# Patient Record
Sex: Female | Born: 1950
Health system: Southern US, Community
[De-identification: ages and names within clinical notes are randomized; demographics above are authoritative.]

## PROBLEM LIST (undated history)

## (undated) DIAGNOSIS — J189 Pneumonia, unspecified organism: Secondary | ICD-10-CM

## (undated) DIAGNOSIS — C9000 Multiple myeloma not having achieved remission: Secondary | ICD-10-CM

## (undated) DIAGNOSIS — M1990 Unspecified osteoarthritis, unspecified site: Secondary | ICD-10-CM

## (undated) DIAGNOSIS — Z87442 Personal history of urinary calculi: Secondary | ICD-10-CM

## (undated) DIAGNOSIS — T8859XA Other complications of anesthesia, initial encounter: Secondary | ICD-10-CM

## (undated) DIAGNOSIS — Z8619 Personal history of other infectious and parasitic diseases: Secondary | ICD-10-CM

## (undated) DIAGNOSIS — G709 Myoneural disorder, unspecified: Secondary | ICD-10-CM

## (undated) DIAGNOSIS — J302 Other seasonal allergic rhinitis: Secondary | ICD-10-CM

## (undated) DIAGNOSIS — N189 Chronic kidney disease, unspecified: Secondary | ICD-10-CM

## (undated) HISTORY — DX: Personal history of other infectious and parasitic diseases: Z86.19

## (undated) HISTORY — DX: Chronic kidney disease, unspecified: N18.9

## (undated) HISTORY — PX: NO PAST SURGERIES: SHX2092

## (undated) HISTORY — DX: Multiple myeloma not having achieved remission: C90.00

## (undated) HISTORY — PX: APPENDECTOMY: SHX54

---

## 2003-04-22 ENCOUNTER — Other Ambulatory Visit: Admission: RE | Admit: 2003-04-22 | Discharge: 2003-04-22 | Payer: Self-pay | Admitting: Gynecology

## 2006-08-07 ENCOUNTER — Ambulatory Visit: Payer: Self-pay | Admitting: Internal Medicine

## 2009-02-03 ENCOUNTER — Ambulatory Visit: Payer: Self-pay | Admitting: Urology

## 2010-01-16 ENCOUNTER — Ambulatory Visit: Payer: Self-pay | Admitting: Internal Medicine

## 2010-02-01 ENCOUNTER — Ambulatory Visit: Payer: Self-pay | Admitting: Urology

## 2010-02-05 ENCOUNTER — Ambulatory Visit: Payer: Self-pay | Admitting: Nephrology

## 2010-02-06 ENCOUNTER — Inpatient Hospital Stay: Payer: Self-pay | Admitting: Nephrology

## 2010-02-15 ENCOUNTER — Ambulatory Visit: Payer: Self-pay | Admitting: Internal Medicine

## 2010-07-06 ENCOUNTER — Ambulatory Visit: Payer: Self-pay | Admitting: Cardiovascular Disease

## 2010-07-10 ENCOUNTER — Ambulatory Visit: Payer: Self-pay | Admitting: Cardiovascular Disease

## 2010-07-12 ENCOUNTER — Ambulatory Visit: Payer: Self-pay | Admitting: Cardiovascular Disease

## 2010-11-17 ENCOUNTER — Ambulatory Visit: Payer: Self-pay | Admitting: Internal Medicine

## 2010-12-18 ENCOUNTER — Ambulatory Visit: Payer: Self-pay | Admitting: Internal Medicine

## 2011-01-17 ENCOUNTER — Ambulatory Visit: Payer: Self-pay | Admitting: Internal Medicine

## 2012-02-27 ENCOUNTER — Ambulatory Visit: Payer: Self-pay | Admitting: Hematology and Oncology

## 2012-03-18 ENCOUNTER — Ambulatory Visit: Payer: Self-pay | Admitting: Hematology and Oncology

## 2012-04-08 ENCOUNTER — Emergency Department: Payer: Self-pay | Admitting: Emergency Medicine

## 2012-04-08 LAB — COMPREHENSIVE METABOLIC PANEL
Albumin: 3.8 g/dL (ref 3.4–5.0)
BUN: 26 mg/dL — ABNORMAL HIGH (ref 7–18)
Bilirubin,Total: 0.5 mg/dL (ref 0.2–1.0)
Chloride: 105 mmol/L (ref 98–107)
Co2: 24 mmol/L (ref 21–32)
EGFR (Non-African Amer.): >60
Glucose: 146 mg/dL — ABNORMAL HIGH (ref 65–99)
SGOT(AST): 19 U/L (ref 15–37)
SGPT (ALT): 19 U/L (ref 12–78)
Total Protein: 7.5 g/dL (ref 6.4–8.2)

## 2012-04-08 LAB — CBC
HGB: 12.8 g/dL (ref 12.0–16.0)
MCH: 30.3 pg (ref 26.0–34.0)
MCHC: 33.6 g/dL (ref 32.0–36.0)
Platelet: 176 10*3/uL (ref 150–440)
RBC: 4.24 10*6/uL (ref 3.80–5.20)
RDW: 14 % (ref 11.5–14.5)

## 2012-05-16 ENCOUNTER — Ambulatory Visit: Payer: Self-pay | Admitting: Hematology and Oncology

## 2012-06-05 ENCOUNTER — Ambulatory Visit: Payer: Self-pay | Admitting: General Practice

## 2012-06-05 LAB — CBC CANCER CENTER
Eosinophil #: 0.1 x10 3/mm (ref 0.0–0.7)
HCT: 37.5 % (ref 35.0–47.0)
HGB: 12.5 g/dL (ref 12.0–16.0)
Monocyte %: 9.8 %
Neutrophil #: 2.5 x10 3/mm (ref 1.4–6.5)
Neutrophil %: 53.2 %
Platelet: 139 x10 3/mm — ABNORMAL LOW (ref 150–440)
RBC: 4.15 10*6/uL (ref 3.80–5.20)
RDW: 13.4 % (ref 11.5–14.5)
WBC: 4.7 x10 3/mm (ref 3.6–11.0)

## 2012-06-05 LAB — URINALYSIS, COMPLETE
Glucose,UR: NEGATIVE mg/dL (ref 0–75)
Ketone: NEGATIVE
Nitrite: NEGATIVE
Protein: NEGATIVE
RBC,UR: 2 /HPF (ref 0–5)
Specific Gravity: 1.023 (ref 1.003–1.030)
WBC UR: 1 /HPF (ref 0–5)

## 2012-06-05 LAB — COMPREHENSIVE METABOLIC PANEL
Anion Gap: 8 (ref 7–16)
BUN: 21 mg/dL — ABNORMAL HIGH (ref 7–18)
Calcium, Total: 8.3 mg/dL — ABNORMAL LOW (ref 8.5–10.1)
Co2: 29 mmol/L (ref 21–32)
EGFR (Non-African Amer.): 46 — ABNORMAL LOW
Osmolality: 279 (ref 275–301)

## 2012-06-08 LAB — KAPPA/LAMBDA FREE LIGHT CHAINS (ARMC)

## 2012-06-08 LAB — PROT IMMUNOELECTROPHORES(ARMC)

## 2012-06-16 ENCOUNTER — Ambulatory Visit: Payer: Self-pay | Admitting: Hematology and Oncology

## 2012-06-25 LAB — CBC CANCER CENTER
Basophil #: 0 x10 3/mm (ref 0.0–0.1)
Basophil %: 1 %
Eosinophil #: 0.1 x10 3/mm (ref 0.0–0.7)
Eosinophil %: 1.1 %
HCT: 39.5 % (ref 35.0–47.0)
MCH: 31 pg (ref 26.0–34.0)
MCHC: 34.1 g/dL (ref 32.0–36.0)
Monocyte #: 0.7 x10 3/mm (ref 0.2–0.9)
Monocyte %: 13.6 %
Neutrophil #: 2.8 x10 3/mm (ref 1.4–6.5)
Platelet: 165 x10 3/mm (ref 150–440)
RBC: 4.35 10*6/uL (ref 3.80–5.20)
WBC: 5 x10 3/mm (ref 3.6–11.0)

## 2012-06-25 LAB — BASIC METABOLIC PANEL
Anion Gap: 6 — ABNORMAL LOW (ref 7–16)
BUN: 18 mg/dL (ref 7–18)
Chloride: 107 mmol/L (ref 98–107)
Co2: 28 mmol/L (ref 21–32)
Creatinine: 0.84 mg/dL (ref 0.60–1.30)
EGFR (African American): >60
EGFR (Non-African Amer.): >60
Glucose: 107 mg/dL — ABNORMAL HIGH (ref 65–99)
Osmolality: 284 (ref 275–301)
Sodium: 141 mmol/L (ref 136–145)

## 2012-07-16 ENCOUNTER — Ambulatory Visit: Payer: Self-pay | Admitting: Hematology and Oncology

## 2012-08-17 ENCOUNTER — Ambulatory Visit: Payer: Self-pay | Admitting: Internal Medicine

## 2012-11-05 ENCOUNTER — Ambulatory Visit: Payer: Self-pay | Admitting: Internal Medicine

## 2012-11-26 ENCOUNTER — Ambulatory Visit: Payer: Self-pay | Admitting: Hematology and Oncology

## 2012-11-26 LAB — CBC CANCER CENTER
Basophil %: 0.8 %
Eosinophil %: 0.7 %
Lymphocyte #: 1.6 x10 3/mm (ref 1.0–3.6)
Lymphocyte %: 31.5 %
MCHC: 33.5 g/dL (ref 32.0–36.0)
Monocyte #: 0.6 x10 3/mm (ref 0.2–0.9)
Neutrophil %: 55.7 %
Platelet: 166 x10 3/mm (ref 150–440)
RBC: 4.29 10*6/uL (ref 3.80–5.20)
RDW: 13.3 % (ref 11.5–14.5)
WBC: 5 x10 3/mm (ref 3.6–11.0)

## 2012-11-26 LAB — BASIC METABOLIC PANEL
Anion Gap: 9 (ref 7–16)
BUN: 21 mg/dL — ABNORMAL HIGH (ref 7–18)
Calcium, Total: 9 mg/dL (ref 8.5–10.1)
Co2: 29 mmol/L (ref 21–32)
EGFR (African American): >60
EGFR (Non-African Amer.): 54 — ABNORMAL LOW
Glucose: 121 mg/dL — ABNORMAL HIGH (ref 65–99)
Osmolality: 285 (ref 275–301)

## 2012-11-30 LAB — PROT IMMUNOELECTROPHORES(ARMC)

## 2012-11-30 LAB — KAPPA/LAMBDA FREE LIGHT CHAINS (ARMC)

## 2012-12-15 ENCOUNTER — Ambulatory Visit: Payer: Self-pay | Admitting: Internal Medicine

## 2012-12-16 ENCOUNTER — Ambulatory Visit: Payer: Self-pay | Admitting: Hematology and Oncology

## 2013-01-04 ENCOUNTER — Encounter: Payer: Self-pay | Admitting: Internal Medicine

## 2013-01-04 ENCOUNTER — Encounter (INDEPENDENT_AMBULATORY_CARE_PROVIDER_SITE_OTHER): Payer: Self-pay

## 2013-01-04 ENCOUNTER — Ambulatory Visit (INDEPENDENT_AMBULATORY_CARE_PROVIDER_SITE_OTHER): Payer: 59 | Admitting: Internal Medicine

## 2013-01-04 VITALS — BP 130/70 | HR 63 | Temp 98.2°F | Ht 68.25 in | Wt 174.8 lb

## 2013-01-04 DIAGNOSIS — C9000 Multiple myeloma not having achieved remission: Secondary | ICD-10-CM | POA: Insufficient documentation

## 2013-01-04 NOTE — Progress Notes (Signed)
  Subjective:    Patient ID: April Chapman, female    DOB: 22-Dec-1950, 62 y.o.   MRN: [id number]  HPI 62 year old female with past history of multiple myeloma who comes in today to establish care.  She is followed at the [facility] for her multitple myeloma.  Was diagnosed in 2011.  Is s/p stem cell transplant 08/2010.  Is in full remission.  Doing well.   Previously affected her kidneys.  Saw Dr Cherylann Ratel.  Now kidney function is normal per her report.  Is evaluated at the [facility] every six months.  Has not had a primary care doctor.  Has not had a breast, pelvic or pap smear in years.  No recent mammogram.  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  Breathing stable.  No acid reflux.  Bowels stable.     Past Medical History  Diagnosis Date  . Multiple myeloma     s/p stem cell transplant (08/2010)  . History of chicken pox   . Chronic kidney disease H/O    stones    Review of Systems Patient denies any headache, lightheadedness or dizziness.  No sinus or allergy symptoms.   No chest pain, tightness or palpitations.  No increased shortness of breath, cough or congestion.  No nausea or vomiting.  No acid reflux.  No abdominal pain or cramping.  No bowel change, such as diarrhea, constipation, BRBPR or melana.  No urine change.  Overall she feels well.  Feels she is handling stress well.         Objective:   Physical Exam Filed Vitals:   01/04/13 0809  BP: 130/70  Pulse: 63  Temp: 98.2 F (68.89 C)   62 year old female in no acute distress.   HEENT:  Nares- clear.  Oropharynx - without lesions. NECK:  Supple.  Nontender.  No audible bruit.  HEART:  Appears to be regular. LUNGS:  No crackles or wheezing audible.  Respirations even and unlabored.  RADIAL PULSE:  Equal bilaterally.     ABDOMEN:  Soft, nontender.  Bowel sounds present and normal.  No audible abdominal bruit.   EXTREMITIES:  No increased edema present.  DP pulses  palpable and equal bilaterally.          Assessment & Plan:  PREVIOUS RENAL INSUFFICIENCY.  Saw Dr Cherylann Ratel.  Now renal function normal per her report.  Obtain records.    HEALTH MAINTENANCE.  Schedule her for a physical next visit.  Needs mammogram and colonoscopy.  Discussed with her today.  She declines.  Obtain outside records.    I spent 35 minutes with the patient and more than 50% of the time was spent in consultation regarding the above.

## 2013-01-04 NOTE — Assessment & Plan Note (Signed)
Diagnosed in 2011.  S/p stem cell transplant in 08/2010.  Doing well.  Followed at the [facility].  They are checking labs.  Obtain results.

## 2013-02-25 ENCOUNTER — Ambulatory Visit: Payer: Self-pay | Admitting: Hematology and Oncology

## 2013-02-25 LAB — CBC CANCER CENTER
Basophil %: 0.9 %
Eosinophil #: 0.1 x10 3/mm (ref 0.0–0.7)
Eosinophil %: 2.1 %
HGB: 13.4 g/dL (ref 12.0–16.0)
Lymphocyte #: 1.4 x10 3/mm (ref 1.0–3.6)
Lymphocyte %: 22 %
MCH: 30 pg (ref 26.0–34.0)
Monocyte #: 0.9 x10 3/mm (ref 0.2–0.9)
Neutrophil %: 60.5 %
Platelet: 150 x10 3/mm (ref 150–440)
RBC: 4.45 10*6/uL (ref 3.80–5.20)
RDW: 13.2 % (ref 11.5–14.5)
WBC: 6.5 x10 3/mm (ref 3.6–11.0)

## 2013-02-25 LAB — COMPREHENSIVE METABOLIC PANEL
Bilirubin,Total: 0.5 mg/dL (ref 0.2–1.0)
Chloride: 101 mmol/L (ref 98–107)
Co2: 28 mmol/L (ref 21–32)
Creatinine: 0.99 mg/dL (ref 0.60–1.30)
EGFR (African American): >60
EGFR (Non-African Amer.): >60
Glucose: 99 mg/dL (ref 65–99)
Osmolality: 280 (ref 275–301)
Potassium: 3.5 mmol/L (ref 3.5–5.1)
SGPT (ALT): 20 U/L (ref 12–78)

## 2013-03-01 LAB — KAPPA/LAMBDA FREE LIGHT CHAINS (ARMC)

## 2013-03-18 ENCOUNTER — Ambulatory Visit: Payer: Self-pay | Admitting: Hematology and Oncology

## 2013-04-05 ENCOUNTER — Encounter: Payer: 59 | Admitting: Internal Medicine

## 2013-04-23 ENCOUNTER — Other Ambulatory Visit (HOSPITAL_COMMUNITY)
Admission: RE | Admit: 2013-04-23 | Discharge: 2013-04-23 | Disposition: A | Discharge status: Home / Self Care | Payer: 59 | Source: Ambulatory Visit | Attending: Internal Medicine | Admitting: Internal Medicine

## 2013-04-23 ENCOUNTER — Encounter: Payer: Self-pay | Admitting: Internal Medicine

## 2013-04-23 ENCOUNTER — Encounter: Payer: Self-pay | Admitting: Emergency Medicine

## 2013-04-23 ENCOUNTER — Ambulatory Visit (INDEPENDENT_AMBULATORY_CARE_PROVIDER_SITE_OTHER): Payer: 59 | Admitting: Internal Medicine

## 2013-04-23 VITALS — BP 120/80 | HR 64 | Temp 97.9°F | Ht 68.0 in | Wt 173.5 lb

## 2013-04-23 DIAGNOSIS — M25519 Pain in unspecified shoulder: Secondary | ICD-10-CM

## 2013-04-23 DIAGNOSIS — Z124 Encounter for screening for malignant neoplasm of cervix: Secondary | ICD-10-CM

## 2013-04-23 DIAGNOSIS — IMO0002 Reserved for concepts with insufficient information to code with codable children: Secondary | ICD-10-CM

## 2013-04-23 DIAGNOSIS — Z01419 Encounter for gynecological examination (general) (routine) without abnormal findings: Secondary | ICD-10-CM | POA: Insufficient documentation

## 2013-04-23 DIAGNOSIS — M25512 Pain in left shoulder: Secondary | ICD-10-CM

## 2013-04-23 DIAGNOSIS — Z1211 Encounter for screening for malignant neoplasm of colon: Secondary | ICD-10-CM

## 2013-04-23 DIAGNOSIS — W5503XA Scratched by cat, initial encounter: Secondary | ICD-10-CM

## 2013-04-23 DIAGNOSIS — M25521 Pain in right elbow: Secondary | ICD-10-CM

## 2013-04-23 DIAGNOSIS — C9000 Multiple myeloma not having achieved remission: Secondary | ICD-10-CM

## 2013-04-23 DIAGNOSIS — M25529 Pain in unspecified elbow: Secondary | ICD-10-CM

## 2013-04-23 DIAGNOSIS — M79609 Pain in unspecified limb: Secondary | ICD-10-CM

## 2013-04-23 DIAGNOSIS — M79673 Pain in unspecified foot: Secondary | ICD-10-CM

## 2013-04-23 DIAGNOSIS — L989 Disorder of the skin and subcutaneous tissue, unspecified: Secondary | ICD-10-CM

## 2013-04-23 DIAGNOSIS — W64XXXA Exposure to other animate mechanical forces, initial encounter: Secondary | ICD-10-CM

## 2013-04-23 DIAGNOSIS — Z1151 Encounter for screening for human papillomavirus (HPV): Secondary | ICD-10-CM | POA: Insufficient documentation

## 2013-04-23 MED ORDER — AMOXICILLIN-POT CLAVULANATE 500-125 MG PO TABS: 1.0000 | ORAL_TABLET | Freq: Three times a day (TID) | ORAL | Status: DC

## 2013-04-23 NOTE — Progress Notes (Signed)
Pre-visit discussion using our clinic review tool. No additional management support is needed unless otherwise documented below in the visit note.  

## 2013-04-23 NOTE — Progress Notes (Signed)
Subjective:    Patient ID: April Chapman, female    DOB: Aug 04, 1950, 63 y.o.   MRN: [id number]  HPI 63 year old female with past history of multiple myeloma who comes in today for her physical exam.  She is followed at the [facility] for her multitple myeloma.  Was diagnosed in 2011.  Is s/p stem cell transplant 08/2010.  Is in full remission.  Doing well.   Previously affected her kidneys.  Saw Dr Holley Raring.  Now kidney function is normal per her report.  Is evaluated at the [facility] every six months.  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  Breathing stable.  No acid reflux.  Bowels stable.  She reports having left shoulder discomfort.  Present for four months.  Saw Dr Sabra Heck.  Had xray.  Diagnosed with bursitis.  She also reports right elbow discomfort.  She previously fell off a truck - one month ago.  Is getting better.  Also reports right foot/heel pain.  Since changing shoes, discomfort better.  If she walks more, better.  She also reports a cat scratch on her left fifth finger.  Has kept clean.     Past Medical History  Diagnosis Date  . Multiple myeloma     s/p stem cell transplant (08/2010)  . History of chicken pox   . Chronic kidney disease H/O    stones    Review of Systems Patient denies any headache, lightheadedness or dizziness.  No sinus or allergy symptoms.   No chest pain, tightness or palpitations.  No increased shortness of breath, cough or congestion.  No nausea or vomiting.  No acid reflux.  No abdominal pain or cramping.  No bowel change, such as diarrhea, constipation, BRBPR or melana.  No urine change.  Overall she feels well.  Feels she is handling stress well.   Shoulder, elbow and heel pain as outlined.  Cat scratch - left fifth finger.  Persistent left lower leg lesion.         Objective:   Physical Exam  Filed Vitals:   04/23/13 0832  BP: 120/80  Pulse: 64  Temp: 97.9 F (47.35 C)   63 year old  female in no acute distress.   HEENT:  Nares- clear.  Oropharynx - without lesions. NECK:  Supple.  Nontender.  No audible bruit.  HEART:  Appears to be regular. LUNGS:  No crackles or wheezing audible.  Respirations even and unlabored.  RADIAL PULSE:  Equal bilaterally.    BREASTS:  No nipple discharge or nipple retraction present.  Could not appreciate any distinct nodules or axillary adenopathy.  ABDOMEN:  Soft, nontender.  Bowel sounds present and normal.  No audible abdominal bruit.  GU:  Normal external genitalia.  Vaginal vault without lesions.  Cervix identified.  Pap performed. Could not appreciate any adnexal masses or tenderness.   RECTAL:  Heme negative.   EXTREMITIES:  No increased edema present.  DP pulses palpable and equal bilaterally.  SKIN:   Cat scratch left fifth finger.  No increased erythema or warmth.  Leg lesion - left upper calf.    MSK:  Increased rom left shoulder.  No pain in the elbow with rotation of the forearm.          Assessment & Plan:  PREVIOUS RENAL INSUFFICIENCY.  Saw Dr Holley Raring.  Now renal function normal per her report.  Obtain records.    HEALTH MAINTENANCE.  Physical today.  Needs mammogram and colonoscopy.  Discussed with her today.  She declines.   I spent 25 minutes with the patient and more than 50% of the time was spent in consultation regarding the above.

## 2013-04-27 ENCOUNTER — Encounter: Payer: Self-pay | Admitting: Internal Medicine

## 2013-04-27 ENCOUNTER — Encounter: Payer: Self-pay | Admitting: *Deleted

## 2013-04-27 DIAGNOSIS — L989 Disorder of the skin and subcutaneous tissue, unspecified: Secondary | ICD-10-CM | POA: Insufficient documentation

## 2013-04-27 DIAGNOSIS — M25521 Pain in right elbow: Secondary | ICD-10-CM | POA: Insufficient documentation

## 2013-04-27 DIAGNOSIS — W5503XA Scratched by cat, initial encounter: Secondary | ICD-10-CM | POA: Insufficient documentation

## 2013-04-27 DIAGNOSIS — M79673 Pain in unspecified foot: Secondary | ICD-10-CM | POA: Insufficient documentation

## 2013-04-27 DIAGNOSIS — M25512 Pain in left shoulder: Secondary | ICD-10-CM | POA: Insufficient documentation

## 2013-04-27 NOTE — Assessment & Plan Note (Signed)
Persistent pain.  Will check xray.  Further w/up peniding.  Has seen Dr Sabra Heck.

## 2013-04-27 NOTE — Assessment & Plan Note (Signed)
Since she has changed shoes.  Better.  She desires no further intervention.  Follow.

## 2013-04-27 NOTE — Assessment & Plan Note (Signed)
Diagnosed in 2011.  S/p stem cell transplant in 08/2010.  Doing well.  [facility]  [facility] are [facility] labs.   

## 2013-04-27 NOTE — Assessment & Plan Note (Signed)
Refer to dermatology for evaluation. 

## 2013-04-27 NOTE — Assessment & Plan Note (Signed)
Cat scratch as outlined.  Treat with augmentin as directed.  Follow.

## 2013-04-27 NOTE — Assessment & Plan Note (Signed)
S/p fall.  Better.  Desires no further intervention at this time.  Follow.

## 2013-05-20 ENCOUNTER — Ambulatory Visit: Payer: Self-pay | Admitting: Hematology and Oncology

## 2013-08-20 ENCOUNTER — Ambulatory Visit: Payer: Self-pay | Admitting: Hematology and Oncology

## 2013-08-20 LAB — CBC CANCER CENTER
Basophil #: 0 x10 3/mm (ref 0.0–0.1)
Basophil %: 0.8 %
EOS ABS: 0.1 x10 3/mm (ref 0.0–0.7)
EOS PCT: 1.5 %
HCT: 37.3 % (ref 35.0–47.0)
HGB: 12.4 g/dL (ref 12.0–16.0)
LYMPHS ABS: 1.5 x10 3/mm (ref 1.0–3.6)
Lymphocyte %: 31 %
MCH: 30.4 pg (ref 26.0–34.0)
MCHC: 33.4 g/dL (ref 32.0–36.0)
MCV: 91 fL (ref 80–100)
MONO ABS: 0.7 x10 3/mm (ref 0.2–0.9)
Monocyte %: 13.8 %
NEUTROS PCT: 52.9 %
Neutrophil #: 2.5 x10 3/mm (ref 1.4–6.5)
PLATELETS: 160 x10 3/mm (ref 150–440)
RBC: 4.09 10*6/uL (ref 3.80–5.20)
RDW: 13.3 % (ref 11.5–14.5)
WBC: 4.7 x10 3/mm (ref 3.6–11.0)

## 2013-08-20 LAB — COMPREHENSIVE METABOLIC PANEL
ALK PHOS: 66 U/L
ANION GAP: 5 — AB (ref 7–16)
AST: 17 U/L (ref 15–37)
Albumin: 3.5 g/dL (ref 3.4–5.0)
BUN: 22 mg/dL — AB (ref 7–18)
Bilirubin,Total: 0.4 mg/dL (ref 0.2–1.0)
CHLORIDE: 106 mmol/L (ref 98–107)
CO2: 27 mmol/L (ref 21–32)
Calcium, Total: 8.9 mg/dL (ref 8.5–10.1)
Creatinine: 1.04 mg/dL (ref 0.60–1.30)
GFR CALC NON AF AMER: 58 — AB
GLUCOSE: 79 mg/dL (ref 65–99)
Osmolality: 278 (ref 275–301)
Potassium: 3.6 mmol/L (ref 3.5–5.1)
SGPT (ALT): 17 U/L (ref 12–78)
Sodium: 138 mmol/L (ref 136–145)
Total Protein: 7 g/dL (ref 6.4–8.2)

## 2013-08-23 LAB — URINE IEP, RANDOM

## 2013-08-25 LAB — KAPPA/LAMBDA FREE LIGHT CHAINS (ARMC)

## 2013-08-25 LAB — PROT IMMUNOELECTROPHORES(ARMC)

## 2013-09-15 ENCOUNTER — Ambulatory Visit: Payer: Self-pay | Admitting: Hematology and Oncology

## 2013-10-22 ENCOUNTER — Ambulatory Visit (INDEPENDENT_AMBULATORY_CARE_PROVIDER_SITE_OTHER): Payer: 59 | Admitting: Internal Medicine

## 2013-10-22 ENCOUNTER — Encounter: Payer: Self-pay | Admitting: Internal Medicine

## 2013-10-22 VITALS — BP 120/80 | HR 68 | Temp 98.4°F | Ht 68.0 in | Wt 178.5 lb

## 2013-10-22 DIAGNOSIS — M25512 Pain in left shoulder: Secondary | ICD-10-CM

## 2013-10-22 DIAGNOSIS — R200 Anesthesia of skin: Secondary | ICD-10-CM | POA: Insufficient documentation

## 2013-10-22 DIAGNOSIS — M25519 Pain in unspecified shoulder: Secondary | ICD-10-CM

## 2013-10-22 DIAGNOSIS — B351 Tinea unguium: Secondary | ICD-10-CM

## 2013-10-22 DIAGNOSIS — C9000 Multiple myeloma not having achieved remission: Secondary | ICD-10-CM

## 2013-10-22 DIAGNOSIS — R209 Unspecified disturbances of skin sensation: Secondary | ICD-10-CM

## 2013-10-22 NOTE — Progress Notes (Signed)
Pre visit review using our clinic review tool, if applicable. No additional management support is needed unless otherwise documented below in the visit note. 

## 2013-10-24 ENCOUNTER — Encounter: Payer: Self-pay | Admitting: Internal Medicine

## 2013-10-24 DIAGNOSIS — B351 Tinea unguium: Secondary | ICD-10-CM | POA: Insufficient documentation

## 2013-10-24 NOTE — Assessment & Plan Note (Signed)
Toenail fungus isolated left great toe.  Discussed treatment options.  She prefers to be referred to podiatry for further evaluation and treatment.

## 2013-10-24 NOTE — Assessment & Plan Note (Signed)
Much better.  ROM improved.

## 2013-10-24 NOTE — Assessment & Plan Note (Signed)
Describes numbness localized to the left great toe (1/2 toe).  Instructed on not wearing narrow shoes.  Support.  Refer to podiatry.

## 2013-10-24 NOTE — Assessment & Plan Note (Signed)
Diagnosed in 2011.  S/p stem cell transplant in 08/2010.  Doing well.  Followed at the [facility].  They are checking labs.

## 2013-10-24 NOTE — Progress Notes (Signed)
  Subjective:    Patient ID: April Chapman, female    DOB: [birth year], 63 y.o.   MRN: [id number]  HPI 63 year old female with past history of multiple myeloma who comes in today for a scheduled follow up.   She is followed at the [facility] for her multitple myeloma.  Was diagnosed in 2011.  Is s/p stem cell transplant 08/2010.  Is in full remission.  Doing well.   Previously affected her kidneys.  Saw Dr Holley Raring.  Now kidney function is normal.  Is evaluated at the [facility] every six months.  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  Breathing stable.  No acid reflux.  Bowels stable.   Saw Dr Sabra Heck at [facility] ortho.  Diagnosed with frozen shoulder.  Has been doing exercises.  Better.   She does report toenail fungus - left great toe.  We discussed treatment options.      Past Medical History  Diagnosis Date  . Multiple myeloma     s/p stem cell transplant (08/2010)  . History of chicken pox   . Chronic kidney disease H/O    stones    Review of Systems Patient denies any headache, lightheadedness or dizziness.  No sinus or allergy symptoms.   No chest pain, tightness or palpitations.  No increased shortness of breath, cough or congestion.  No nausea or vomiting.  No acid reflux.  No abdominal pain or cramping.  No bowel change, such as diarrhea, constipation, BRBPR or melana.  No urine change.  Overall she feels well.  Feels she is handling stress well.   Shoulder better.    Cat scratch - left fifth finger.  Toenail fungus as outlined.           Objective:   Physical Exam  Filed Vitals:   10/22/13 1404  BP: 120/80  Pulse: 68  Temp: 98.4 F (36.9 C)   Blood pressure recheck:  72/57  63 year old female in no acute distress.   HEENT:  Nares- clear.  Oropharynx - without lesions. NECK:  Supple.  Nontender.  No audible bruit.  HEART:  Appears to be regular. LUNGS:  No crackles or wheezing audible.  Respirations even and  unlabored.  RADIAL PULSE:  Equal bilaterally.  ABDOMEN:  Soft, nontender.  Bowel sounds present and normal.  No audible abdominal bruit.   EXTREMITIES:  No increased edema present.  DP pulses palpable and equal bilaterally.  MSK:  Increased rom left shoulder.  No pain in the elbow with rotation of the forearm.          Assessment & Plan:  PREVIOUS RENAL INSUFFICIENCY.  Saw Dr Holley Raring.  Now renal function normal per her report.    HEALTH MAINTENANCE.  Physical 04/23/13.   PAP negative with negative HPV.  Needs mammogram and colonoscopy.  Discussed with her today.  She declines.   I spent 25 minutes with the patient and more than 50% of the time was spent in consultation regarding the above.

## 2014-02-22 ENCOUNTER — Ambulatory Visit: Payer: Self-pay | Admitting: Hematology and Oncology

## 2014-02-22 LAB — COMPREHENSIVE METABOLIC PANEL
ALK PHOS: 65 U/L
ALT: 19 U/L
ANION GAP: 7 (ref 7–16)
Albumin: 3.8 g/dL (ref 3.4–5.0)
BILIRUBIN TOTAL: 0.4 mg/dL (ref 0.2–1.0)
BUN: 21 mg/dL — ABNORMAL HIGH (ref 7–18)
CALCIUM: 9.1 mg/dL (ref 8.5–10.1)
Chloride: 102 mmol/L (ref 98–107)
Co2: 28 mmol/L (ref 21–32)
Creatinine: 0.98 mg/dL (ref 0.60–1.30)
EGFR (African American): >60
EGFR (Non-African Amer.): >60
GLUCOSE: 103 mg/dL — AB (ref 65–99)
Osmolality: 277 (ref 275–301)
POTASSIUM: 3.6 mmol/L (ref 3.5–5.1)
SGOT(AST): 20 U/L (ref 15–37)
Sodium: 137 mmol/L (ref 136–145)
Total Protein: 7 g/dL (ref 6.4–8.2)

## 2014-02-22 LAB — CBC CANCER CENTER
BASOS ABS: 0 x10 3/mm (ref 0.0–0.1)
Basophil %: 0.9 %
EOS PCT: 3.9 %
Eosinophil #: 0.2 x10 3/mm (ref 0.0–0.7)
HCT: 38.9 % (ref 35.0–47.0)
HGB: 12.7 g/dL (ref 12.0–16.0)
Lymphocyte #: 1.6 x10 3/mm (ref 1.0–3.6)
Lymphocyte %: 33.4 %
MCH: 29.5 pg (ref 26.0–34.0)
MCHC: 32.7 g/dL (ref 32.0–36.0)
MCV: 90 fL (ref 80–100)
Monocyte #: 0.6 x10 3/mm (ref 0.2–0.9)
Monocyte %: 12 %
NEUTROS ABS: 2.4 x10 3/mm (ref 1.4–6.5)
Neutrophil %: 49.8 %
PLATELETS: 150 x10 3/mm (ref 150–440)
RBC: 4.3 10*6/uL (ref 3.80–5.20)
RDW: 13.3 % (ref 11.5–14.5)
WBC: 4.8 x10 3/mm (ref 3.6–11.0)

## 2014-02-23 LAB — UR PROT ELECTROPHORESIS, URINE RANDOM

## 2014-02-24 LAB — KAPPA/LAMBDA FREE LIGHT CHAINS (ARMC)

## 2014-02-24 LAB — PROT IMMUNOELECTROPHORES(ARMC)

## 2014-03-18 ENCOUNTER — Ambulatory Visit: Payer: Self-pay | Admitting: Hematology and Oncology

## 2014-04-01 ENCOUNTER — Ambulatory Visit: Payer: Self-pay | Admitting: Family Medicine

## 2014-04-01 LAB — URINALYSIS, COMPLETE
Bilirubin,UR: NEGATIVE
Glucose,UR: NEGATIVE
Glucose,UR: NEGATIVE mg/dL (ref 0–75)
Leukocyte Esterase: NEGATIVE
NITRITE: NEGATIVE
NITRITE: NEGATIVE
PROTEIN: NEGATIVE
Ph: 5.5 (ref 5.0–8.0)
Ph: 5 (ref 4.5–8.0)
RBC,UR: 3 /HPF (ref 0–5)
SPECIFIC GRAVITY: 1.054 (ref 1.003–1.030)
Specific Gravity: >=1.03 (ref 1.000–1.030)
Squamous Epithelial: <1
WBC UR: NONE SEEN /HPF (ref 0–5)
WBC UR: 2 /HPF (ref 0–5)

## 2014-04-01 LAB — CBC WITH DIFFERENTIAL/PLATELET
BASOS PCT: 0.7 %
Basophil #: 0.1 10*3/uL (ref 0.0–0.1)
EOS ABS: 0.1 10*3/uL (ref 0.0–0.7)
Eosinophil %: 0.5 %
HCT: 40.9 % (ref 35.0–47.0)
HGB: 13.4 g/dL (ref 12.0–16.0)
LYMPHS ABS: 1.8 10*3/uL (ref 1.0–3.6)
Lymphocyte %: 17.4 %
MCH: 30 pg (ref 26.0–34.0)
MCHC: 32.8 g/dL (ref 32.0–36.0)
MCV: 91 fL (ref 80–100)
MONO ABS: 1.4 x10 3/mm — AB (ref 0.2–0.9)
Monocyte %: 13.3 %
NEUTROS ABS: 7.1 10*3/uL — AB (ref 1.4–6.5)
NEUTROS PCT: 68.1 %
Platelet: 156 10*3/uL (ref 150–440)
RBC: 4.47 10*6/uL (ref 3.80–5.20)
RDW: 13.4 % (ref 11.5–14.5)
WBC: 10.5 10*3/uL (ref 3.6–11.0)

## 2014-04-01 LAB — COMPREHENSIVE METABOLIC PANEL
ALT: 18 U/L
Albumin: 3.6 g/dL (ref 3.4–5.0)
Alkaline Phosphatase: 68 U/L
Anion Gap: 9 (ref 7–16)
BUN: 23 mg/dL — ABNORMAL HIGH (ref 7–18)
Bilirubin,Total: 0.5 mg/dL (ref 0.2–1.0)
CO2: 27 mmol/L (ref 21–32)
Calcium, Total: 9.1 mg/dL (ref 8.5–10.1)
Chloride: 101 mmol/L (ref 98–107)
Creatinine: 1.04 mg/dL (ref 0.60–1.30)
EGFR (African American): >60
EGFR (Non-African Amer.): 57 — ABNORMAL LOW
Glucose: 107 mg/dL — ABNORMAL HIGH (ref 65–99)
Osmolality: 278 (ref 275–301)
POTASSIUM: 3.4 mmol/L — AB (ref 3.5–5.1)
SGOT(AST): 17 U/L (ref 15–37)
Sodium: 137 mmol/L (ref 136–145)
Total Protein: 7.9 g/dL (ref 6.4–8.2)

## 2014-04-01 LAB — MAGNESIUM: Magnesium: 1.9 mg/dL

## 2014-04-01 LAB — LIPASE, BLOOD: LIPASE: 222 U/L (ref 73–393)

## 2014-04-01 LAB — PROTIME-INR
INR: 1
PROTHROMBIN TIME: 13.3 s (ref 11.5–14.7)

## 2014-04-02 ENCOUNTER — Inpatient Hospital Stay: Payer: Self-pay | Admitting: Surgery

## 2014-04-02 LAB — HEMOGLOBIN: HGB: 12.4 g/dL (ref 12.0–16.0)

## 2014-04-25 ENCOUNTER — Encounter: Payer: 59 | Admitting: Internal Medicine

## 2014-06-24 ENCOUNTER — Ambulatory Visit
Admit: 2014-06-24 | Disposition: A | Discharge status: Home / Self Care | Payer: Self-pay | Attending: Hematology and Oncology | Admitting: Hematology and Oncology

## 2014-07-11 LAB — SURGICAL PATHOLOGY

## 2014-07-17 NOTE — Op Note (Signed)
PATIENT NAME:  April Chapman, April Chapman MR#:  [id number] DATE OF BIRTH:  02-15-1951  DATE OF PROCEDURE:  04/01/2014  PREOPERATIVE DIAGNOSIS: Acute appendicitis.   POSTOPERATIVE DIAGNOSIS: Acute retrocecal appendicitis.   PROCEDURE: Laparoscopic appendectomy.   SURGEON: Richard E. Burt Knack, MD   ANESTHESIA: General with endotracheal tube.   INDICATIONS: This is a patient with right flank pain and right lower quadrant pain and workup showing probable appendicitis. Preoperatively, we discussed rationale for surgery, the options of observation, risk of bleeding, infection, recurrence of symptoms, failure to resolve her symptoms, and possible conversion to an open procedure as well as a negative laparoscopy. This was all reviewed for her. She understood and agreed to proceed.   FINDINGS: Acute appendicitis in a retrocecal position.   DESCRIPTION OF PROCEDURE: The patient was induced to general anesthesia. She was given IV antibiotics. VTE prophylaxis was in place. Foley catheter was placed. She was prepped and draped in a sterile fashion. Marcaine was infiltrated in skin and subcutaneous tissues around the periumbilical area. An incision was made. Veress needle was placed. Pneumoperitoneum was obtained. A 5 mm trocar port was placed. The abdominal cavity was explored, and under direct vision, a 5 mm suprapubic port and left lateral 12 mm port was placed. The appendix was identified in the right lower quadrant and found to track cephalad along the right lateral peritoneal reflection in a retrocecal position. The base of the appendix was identified and divided with a standard load Endo GIA. Traction was placed on the appendix an elevation was performed. A vascular load Endo GIA was fired across the proximal mesentery of the appendix and in so doing the appendix was transected not by the staple line but by tension. This portion of the appendix was brought out through the lateral port site for later examination.    Attention was returned to the remaining remnant of the appendix. It was elevated, multiple vascular load Endo GIA were fired to elevate the appendix from the retrocecal area and it too was passed out through the lateral port site with the aid of an Endo Catch bag. The area was irrigated with copious amounts of normal saline. Hemostasis was checked and found to be adequate and then under direct vision a left lateral port site was closed with an Endo Close technique with multiple sutures of 0 Vicryl under direct vision. Again, hemostasis was checked and found to be adequate. There was no sign of bleeding or bowel injury. Therefore, pneumoperitoneum was released. All ports were removed. A 4-0 subcuticular Monocryl was used on all skin edges. Steri-Strips, Mastisol, and sterile dressings were placed.   The patient tolerated the procedure well. There were no complications. She was taken to the recovery room in stable condition to be admitted for continued care.    ____________________________ Jerrol Banana. Burt Knack, MD rec:bm D: 04/01/2014 22:50:00 ET T: 04/02/2014 02:13:12 ET JOB#: [id number]  cc: Jerrol Banana. Burt Knack, MD, <Dictator> Florene Glen MD ELECTRONICALLY SIGNED 04/02/2014 19:31

## 2014-07-17 NOTE — H&P (Signed)
Subjective/Chief Complaint rt flankl pain   History of Present Illness started Tuesday has had one prior episode, see dictation [place], no f/c, no dysuria hx of KS, left perc neph   Past History PMH myeloma, nephrolith PSH perc neph tube left, BMT, Central vv cath clot around catheter, lovenox for two mos three years ago   Past Medical Health Cancer   Past Med/Surgical Hx:  Renal Calculi:   Multiple Myeloma S/p Bone Marrow transplant:   ALLERGIES:  Revlimid: Rash  Family and Social History:  Family History Negative   Social History negative tobacco, negative ETOH, clerical   Place of Living Home   Review of Systems:  Fever/Chills No   Cough No   Abdominal Pain Yes   Diarrhea No   Constipation No   Nausea/Vomiting Yes   SOB/DOE No   Chest Pain No   Dysuria No   Tolerating Diet No  Nauseated   Medications/Allergies Reviewed Medications/Allergies reviewed   Physical Exam:  GEN no acute distress   HEENT pink conjunctivae   NECK supple   RESP normal resp effort  clear BS   CARD regular rate   ABD positive tenderness  no hernia  rt flank and RLQ McB Pt tenderness, pos Rovsing's sign, guarding, perc tenderness   LYMPH negative neck   EXTR negative edema   SKIN normal to palpation   PSYCH alert, A+O to time, place, person, good insight   Lab Results: Routine Chem:  15-Jan-16 20:20   Lipase 222 (Result(s) reported on 01 Apr 2014 at 08:50PM.)  Magnesium, Serum 1.9 (1.8-2.4 THERAPEUTIC RANGE: 4-7 mg/dL TOXIC: > 10 mg/dL  -----------------------)  Routine UA:  15-Jan-16 20:36   Color (UA) Straw  Clarity (UA) Clear  Glucose (UA) Negative  Bilirubin (UA) Negative  Ketones (UA) Trace  Specific Gravity (UA) 1.054  Blood (UA) 1+  pH (UA) 5.0  Protein (UA) Negative  Nitrite (UA) Negative  Leukocyte Esterase (UA) Trace (Result(s) reported on 01 Apr 2014 at 09:12PM.)  RBC (UA) 3 /HPF  WBC (UA) 2 /HPF  Bacteria (UA) 1+  Epithelial Cells  (UA) <1 /HPF  Mucous (UA) PRESENT (Result(s) reported on 01 Apr 2014 at 09:12PM.)  Routine Coag:  15-Jan-16 20:20   Prothrombin 13.3  INR 1.0 (INR reference interval applies to patients on anticoagulant therapy. A single INR therapeutic range for coumarins is not optimal for all indications; however, the suggested range for most indications is 2.0 - 3.0. Exceptions to the INR Reference Range may include: Prosthetic heart valves, acute myocardial infarction, prevention of myocardial infarction, and combinations of aspirin and anticoagulant. The need for a higher or lower target INR must be assessed individually. Reference: The Pharmacology and Management of the Vitamin K  antagonists: the seventh ACCP Conference on Antithrombotic and Thrombolytic Therapy. TMLYY.5035 Sept:126 (3suppl): [id number]. A HCT value >55% may artifactually increase the PT.  In one study,  the increase was an average of 25%. Reference:  "Effect on Routine and Special Coagulation Testing Values of Citrate Anticoagulant Adjustment in Patients with High HCT Values." American Journal of Clinical Pathology 2006;126:400-405.)   Radiology Results: CT:    15-Jan-16 19:08, CT Abdomen and Pelvis With Contrast  CT Abdomen and Pelvis With Contrast  REASON FOR EXAM:    RLQ pain, tender McBurney's point  COMMENTS:       PROCEDURE: CT  - CT ABDOMEN / PELVIS  W  - Apr 01 2014  7:08PM     CLINICAL DATA:  Abdominal pain for  several days. Right lower  quadrant pain with nausea, vomiting, diarrhea. Tenderness at  McBurney's point.    EXAM:  CT ABDOMEN AND PELVIS WITH CONTRAST    TECHNIQUE:  Multidetector CT imaging of the abdomen and pelvis was performed  using the standard protocol following bolus administration of  intravenous contrast.    CONTRAST:  100 mL Omnipaque 300    COMPARISON:  CT 06/08/2012    FINDINGS:  The included lung bases are clear.    There is acute appendicitis. The appendix measures 13 mm  with  enhancement and moderate surrounding periappendiceal inflammatory  change. Thereis more focal inflammation distal to the appendiceal  tip measuring 2.2 x 1.1 cm, this may reflect phlegmon formation.  There is no of extraluminal air.  There are scattered tiny 5 mm hypodensities in the right lobe of the  liver, likely small cysts or biliary hamartomas. No suspicious  hepatic lesion. Gallbladder is decompressed, there is no biliary  dilatation. The spleen, pancreas, and adrenal glands are normal.  There is symmetric renal enhancement and excretion. No  hydronephrosis or focal renal abnormality. There are no dilated or  thickened bowel loops. Moderate stool throughout the entire colon.    The urinary bladder is physiologically distended. The uterus and  adnexa are normal for age. There is no pelvic free fluid. Pelvic  phleboliths are noted. There is degenerative disc disease at L5-S1.  Degenerative disc disease at T11-T12. No acute or suspicious osseous  abnormality.     IMPRESSION:  Acute appendicitis. There is focal inflammatory change distal to the  appendiceal tip that is likely phlegmon formation, however no  well-defined abscess.    These results were called by telephone at the time of interpretation  on 04/01/2014 at 7:35 pm to Dr. ORLANDO CONTY , who verbally  acknowledged these results.      Electronically Signed    By: Melanie  Ehinger M.D.    On: 04/01/2014 19:37         Verified By: MELANIE B. EHINGER, M.D.,    Assessment/Admission Diagnosis acute appendicitis, prior Hx of "bad appendix" at [facility] during stem cell. suspect typhlitis in past rec appendectomy options rationale and risks discussed   Electronic Signatures: Cooper, Richard E (MD)  (Signed 15-Jan-16 21:20)  Authored: CHIEF COMPLAINT and HISTORY, PAST MEDICAL/SURGIAL HISTORY, ALLERGIES, FAMILY AND SOCIAL HISTORY, REVIEW OF SYSTEMS, PHYSICAL EXAM, LABS, Radiology, ASSESSMENT AND PLAN   Last Updated:  15-Jan-16 21:20 by Cooper, Richard E (MD) 

## 2014-07-17 NOTE — H&P (Signed)
PATIENT NAME:  April Chapman, April Chapman MR#:  [id number] DATE OF BIRTH:  Feb 06, 1951  DATE OF ADMISSION:  04/01/2014  CHIEF COMPLAINT: Right flank pain.   HISTORY OF PRESENT ILLNESS: This is a patient with right flank pain and right lower abdominal pain. It started in the right flank, has always been in the right flank and lower abdomen. She had an episode like this before when she was having a stem cell transplant and did not want to have surgery at the time. It was treated medically and I suspect that this was actually a typhlitis condition at the time of, probably, a neutropenic episode. This pain started on Tuesday, has been gradually worsening. She has had nausea but no emesis. Denies fevers or chills. Has had a normal bowel movement. No melena or hematochezia. No other problems.   PAST MEDICAL HISTORY: Myeloma for which she underwent a bone marrow transplant.   PAST SURGICAL HISTORY: Central venous catheter placement for bone marrow transplant. Left percutaneous catheter placement. She also has had a history of kidney stones for which she had this percutaneous nephrostomy tube in the left flank. She denies dysuria at this time, or hematuria.   ALLERGIES: REVLIMID.   MEDICATIONS: At this time, none.   FAMILY HISTORY: No history of diabetes or appendicitis, but she has a daughter with kidney stones, as well.   REVIEW OF SYSTEMS: A complete system review was performed and negative with the exception of that mentioned in the HPI.   PHYSICAL EXAMINATION:  GENERAL: Healthy, comfortable-appearing female patient.  VITAL SIGNS: Temperature of 98.2; pulse of 90; respirations 18; blood pressure 141/79; pain scale of 2, it was a 6; 100% room air saturation.  HEENT: Shows no scleral icterus.  NECK: No palpable neck nodes.  CHEST: Clear to auscultation.  CARDIAC: Regular rate and rhythm.  ABDOMEN: Showing guarding and rebound and percussion tenderness in the right lower quadrant just above McBurney point.  She is tender in the right flank as well. There is a left percutaneous nephrostomy scar. Otherwise, her abdominal exam shows guarding and positive Rovsing sign.  EXTREMITIES: Without edema.  NEUROLOGIC: Grossly intact. MUSCULOSKELETAL: Shows no abnormalities.  INTEGUMENT: Shows no jaundice.   LABORATORY DATA: White blood cell count is 10.5. Electrolytes are within normal limits with the exception of a 3.4 potassium. CT scan is personally reviewed, demonstrating likely appendicitis. INR is normal.   ASSESSMENT AND PLAN: This is a patient with likely appendicitis by history, physical, and CT findings. I have recommended laparoscopy and laparoscopic appendectomy. The rationale for this was discussed. The options of observation were reviewed. The risk of bleeding, infection, negative laparoscopy, possible antibiotic therapy as an alternative. The risks of negative laparoscopy were all discussed with her and conversion to an open procedure was reviewed as well. She understood and agreed to proceed.  She will be treated with heparin and sequential compression devices and intravenous antibiotics preoperatively. 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 None since. No problems since.   It also appears that she probably had a neutropenic typhlitis at the time of her stem cell transplant in the past. Patient understands and agrees with this plan.   ____________________________ Jerrol Banana. Burt Knack, MD rec:ST D: 04/01/2014 21:25:49 ET T: 04/01/2014 22:25:47 ET JOB#: [id number]  cc: Jerrol Banana. Burt Knack, MD, <Dictator> Florene Glen MD ELECTRONICALLY SIGNED 04/02/2014 1:00

## 2015-03-19 HISTORY — PX: APPENDECTOMY: SHX54

## 2015-03-23 DIAGNOSIS — C9001 Multiple myeloma in remission: Secondary | ICD-10-CM | POA: Insufficient documentation

## 2015-06-02 ENCOUNTER — Telehealth: Payer: Self-pay | Admitting: Internal Medicine

## 2015-06-02 NOTE — Telephone Encounter (Addendum)
Lm on vm for pt to call back and schedule a CPE for sometime in June.. Please schedule when pt calls back.. Thanks Pt called back and appt was scheduled for 08/31/15 also let pt know there will be lab work done at appt also.April Chapman

## 2015-08-31 ENCOUNTER — Encounter: Payer: 59 | Admitting: Internal Medicine

## 2015-11-13 ENCOUNTER — Ambulatory Visit (INDEPENDENT_AMBULATORY_CARE_PROVIDER_SITE_OTHER): Payer: BLUE CROSS/BLUE SHIELD | Admitting: Internal Medicine

## 2015-11-13 ENCOUNTER — Encounter: Payer: Self-pay | Admitting: Internal Medicine

## 2015-11-13 VITALS — BP 116/64 | HR 72 | Temp 98.4°F | Resp 16 | Ht 67.5 in | Wt 185.0 lb

## 2015-11-13 DIAGNOSIS — C9001 Multiple myeloma in remission: Secondary | ICD-10-CM

## 2015-11-13 DIAGNOSIS — Z0000 Encounter for general adult medical examination without abnormal findings: Secondary | ICD-10-CM

## 2015-11-13 DIAGNOSIS — B351 Tinea unguium: Secondary | ICD-10-CM | POA: Diagnosis not present

## 2015-11-13 DIAGNOSIS — R229 Localized swelling, mass and lump, unspecified: Secondary | ICD-10-CM | POA: Diagnosis not present

## 2015-11-13 NOTE — Progress Notes (Signed)
Patient ID: April Chapman, female   DOB: [birth year], 65 y.o.   MRN: [id number]   Subjective:    Patient ID: April Chapman, female    DOB: 08/01/1950, 65 y.o.   MRN: [id number]  HPI  Patient here for her physical exam.  She does not want to have a physical today.  She wants to wait until January.  I have not seen her since 2015.  She has been followed by her hematologist/oncologist.  See note.  Was informed labs could be followed here.  She reports she tries to stay active.  No chest pain.  No sob.  No acid reflux.  No abdominal pain or cramping.  Bowels stable.  She does report noticing a "fullness" bottom of her foot.  No pain.  No urine change.  Discussed labs.  Discussed the need for colon cancer screening and mammogram.  She declines.     Past Medical History:  Diagnosis Date  . Chronic kidney disease H/O   stones  . History of chicken pox   . Multiple myeloma (HCC)    s/p stem cell transplant (08/2010)   Past Surgical History:  Procedure Laterality Date  . APPENDECTOMY  03/2015  . NO PAST SURGERIES     Family History  Problem Relation Age of Onset  . Mental illness Mother   . Hypercholesterolemia Sister   . Colon cancer Neg Hx   . Breast cancer Neg Hx    Social History   Social History  . Marital status: Widowed    Spouse name: N/A  . Number of children: 2  . Years of education: N/A   Occupational History  . retired [employer] Topics  . Smoking status: Never Smoker  . Smokeless tobacco: Never Used  . Alcohol use No  . Drug use: No  . Sexual activity: No   Other Topics Concern  . None   Social History Narrative  . None    No outpatient encounter prescriptions on file as of 11/13/2015.   No facility-administered encounter medications on file as of 11/13/2015.     Review of Systems  Constitutional: Negative for appetite change and unexpected weight change.  HENT: Negative for congestion and sinus pressure.   Respiratory:  Negative for cough, chest tightness and shortness of breath.   Cardiovascular: Negative for chest pain, palpitations and leg swelling.  Gastrointestinal: Negative for abdominal pain, diarrhea, nausea and vomiting.  Genitourinary: Negative for difficulty urinating and dysuria.  Musculoskeletal: Negative for back pain and joint swelling.  Skin: Negative for color change and rash.  Neurological: Negative for dizziness, light-headedness and headaches.  Psychiatric/Behavioral: Negative for agitation and dysphoric mood.       Objective:    Physical Exam  Constitutional: She appears well-developed and well-nourished. No distress.  HENT:  Nose: Nose normal.  Mouth/Throat: Oropharynx is clear and moist.  Neck: Neck supple. No thyromegaly present.  Cardiovascular: Normal rate and regular rhythm.   Pulmonary/Chest: Breath sounds normal. No respiratory distress. She has no wheezes.  Abdominal: Soft. Bowel sounds are normal. There is no tenderness.  Musculoskeletal: She exhibits no edema or tenderness.  Palpable nodule over plantar surface of both feet.  No pain.   Lymphadenopathy:    She has no cervical adenopathy.  Skin: No rash noted. No erythema.  Psychiatric: Her behavior is normal.    BP 116/64 (BP Location: Left Arm, Patient Position: Sitting, Cuff Size: Large)   Pulse 72   Temp 98.4  F (36.9 C) (Oral)   Resp 16   Ht 5' 7.5" (1.715 m)   Wt 185 lb (83.9 kg)   BMI 28.55 kg/m  Wt Readings from Last 3 Encounters:  11/13/15 185 lb (83.9 kg)  10/22/13 178 lb 8 oz (81 kg)  04/23/13 173 lb 8 oz (78.7 kg)     Lab Results  Component Value Date   WBC 10.5 04/01/2014   HGB 12.4 04/02/2014   HCT 40.9 04/01/2014   PLT 156 04/01/2014   GLUCOSE 107 (H) 04/01/2014   ALT 18 04/01/2014   AST 17 04/01/2014   NA 137 04/01/2014   K 3.4 (L) 04/01/2014   CL 101 04/01/2014   CREATININE 1.04 04/01/2014   BUN 23 (H) 04/01/2014   CO2 27 04/01/2014   INR 1.0 04/01/2014       Assessment  & Plan:   Problem List Items Addressed This Visit    Health care maintenance    Discussed with her today.  She declined physical today.  PAP negative with negative HPV 05/03/13.  Declines mammogram and colonoscopy or colon cancer screening.  Discussed with her today.        Multiple myeloma in remission [facility]) - Primary    Has been followed by her hematologist/oncologist (Dr Holley Raring).  Per note, planning to have labs followed here now.  Orders placed for labs.  Has been stable.        Relevant Orders   CBC with Differential/Platelet   Lipid panel   TSH   Basic metabolic panel   Hepatic function panel   PE+Interp(Rfx IFE+FLC), S   Toenail fungus    Resolved.         Other Visit Diagnoses    Skin nodule       persistent "nodule" plantar surface of foot.  refer to podiatry for further evaluation.     Relevant Orders   Ambulatory referral to Podiatry     I spent 25 minutes with the patient and more than 50% of the time was spent in consultation regarding the above.     Einar Pheasant, MD

## 2015-11-14 ENCOUNTER — Encounter: Payer: Self-pay | Admitting: Internal Medicine

## 2015-11-14 DIAGNOSIS — Z0000 Encounter for general adult medical examination without abnormal findings: Secondary | ICD-10-CM | POA: Insufficient documentation

## 2015-11-14 LAB — BASIC METABOLIC PANEL
BUN: 28 mg/dL — AB (ref 6–23)
CHLORIDE: 105 meq/L (ref 96–112)
CO2: 28 mEq/L (ref 19–32)
Calcium: 9 mg/dL (ref 8.4–10.5)
Creatinine, Ser: 1.09 mg/dL (ref 0.40–1.20)
GFR: 53.6 mL/min — ABNORMAL LOW (ref >60.00)
GLUCOSE: 98 mg/dL (ref 70–99)
POTASSIUM: 3.6 meq/L (ref 3.5–5.1)
Sodium: 138 mEq/L (ref 135–145)

## 2015-11-14 LAB — TSH: TSH: 0.7 u[IU]/mL (ref 0.35–4.50)

## 2015-11-14 LAB — CBC WITH DIFFERENTIAL/PLATELET
BASOS ABS: 0 10*3/uL (ref 0.0–0.1)
Basophils Relative: 0.2 % (ref 0.0–3.0)
EOS ABS: 0.1 10*3/uL (ref 0.0–0.7)
Eosinophils Relative: 1.7 % (ref 0.0–5.0)
HEMATOCRIT: 39.2 % (ref 36.0–46.0)
Hemoglobin: 13.4 g/dL (ref 12.0–15.0)
LYMPHS ABS: 1.6 10*3/uL (ref 0.7–4.0)
LYMPHS PCT: 27.1 % (ref 12.0–46.0)
MCHC: 34.1 g/dL (ref 30.0–36.0)
MCV: 89.1 fl (ref 78.0–100.0)
Monocytes Absolute: 0.6 10*3/uL (ref 0.1–1.0)
Monocytes Relative: 10.2 % (ref 3.0–12.0)
NEUTROS ABS: 3.6 10*3/uL (ref 1.4–7.7)
NEUTROS PCT: 60.8 % (ref 43.0–77.0)
PLATELETS: 172 10*3/uL (ref 150.0–400.0)
RBC: 4.4 Mil/uL (ref 3.87–5.11)
RDW: 13.6 % (ref 11.5–15.5)
WBC: 5.9 10*3/uL (ref 4.0–10.5)

## 2015-11-14 LAB — HEPATIC FUNCTION PANEL
ALBUMIN: 4.2 g/dL (ref 3.5–5.2)
ALT: 12 U/L (ref 0–35)
AST: 17 U/L (ref 0–37)
Alkaline Phosphatase: 55 U/L (ref 39–117)
Bilirubin, Direct: 0.1 mg/dL (ref 0.0–0.3)
Total Bilirubin: 0.4 mg/dL (ref 0.2–1.2)
Total Protein: 7.1 g/dL (ref 6.0–8.3)

## 2015-11-14 LAB — LIPID PANEL
Cholesterol: 245 mg/dL — ABNORMAL HIGH (ref 0–200)
HDL: 51.7 mg/dL (ref >39.00)
LDL Cholesterol: 172 mg/dL — ABNORMAL HIGH (ref 0–99)
NonHDL: 193.11
TRIGLYCERIDES: 106 mg/dL (ref 0.0–149.0)
Total CHOL/HDL Ratio: 5
VLDL: 21.2 mg/dL (ref 0.0–40.0)

## 2015-11-14 NOTE — Assessment & Plan Note (Signed)
Has been followed by her hematologist/oncologist (Dr Holley Raring).  Per note, planning to have labs followed here now.  Orders placed for labs.  Has been stable.

## 2015-11-14 NOTE — Assessment & Plan Note (Signed)
Discussed with her today.  She declined physical today.  PAP negative with negative HPV 05/03/13.  Declines mammogram and colonoscopy or colon cancer screening.  Discussed with her today.

## 2015-11-14 NOTE — Assessment & Plan Note (Signed)
Resolved

## 2015-11-17 ENCOUNTER — Telehealth: Payer: Self-pay | Admitting: Internal Medicine

## 2015-11-17 ENCOUNTER — Encounter: Payer: Self-pay | Admitting: Internal Medicine

## 2015-11-17 LAB — PE+INTERP(RFX IFE+FLC), S
A/G Ratio: 1.6 (ref 0.7–1.7)
ALPHA 2: 0.5 g/dL (ref 0.4–1.0)
Albumin ELP: 4.2 g/dL (ref 2.9–4.4)
Alpha 1: 0.2 g/dL (ref 0.0–0.4)
Beta: 1 g/dL (ref 0.7–1.3)
GAMMA GLOBULIN: 1 g/dL (ref 0.4–1.8)
GLOBULIN, TOTAL: 2.7 g/dL (ref 2.2–3.9)
TOTAL PROTEIN: 6.9 g/dL (ref 6.0–8.5)

## 2015-11-17 NOTE — Telephone Encounter (Signed)
Pt called wanting to know if her lab results are in? Please advise?  Call pt @ [phone]. Thank you!

## 2015-11-17 NOTE — Telephone Encounter (Signed)
Please advise 

## 2015-11-17 NOTE — Telephone Encounter (Signed)
Informed patient that results were sent via [facility]

## 2015-11-17 NOTE — Telephone Encounter (Signed)
Printed labs for patient.

## 2015-11-22 ENCOUNTER — Telehealth: Payer: Self-pay | Admitting: *Deleted

## 2015-11-22 NOTE — Telephone Encounter (Signed)
Labs are up front and ready to be picked up.

## 2015-11-22 NOTE — Telephone Encounter (Signed)
Patient has requested lab results Pt contact [phone]

## 2015-11-23 ENCOUNTER — Telehealth: Payer: Self-pay | Admitting: Internal Medicine

## 2015-11-23 NOTE — Telephone Encounter (Signed)
Collie Siad from [facility] called  About pt.. Please advise Collie Siad at [phone].Marland Kitchen

## 2015-11-23 NOTE — Telephone Encounter (Signed)
I spoke with April Chapman, she was returning Dr. Bary Leriche message from yesterday.   She states that the frequency of the SPAP and light chains is based on the lab results them selves.  I.e if they are a set number it will indicate 54month re check or more.  She said if you fax her the results that were drawn she can assist with the frequency.    You can call her back at the number provided it was a direct to her. thanks

## 2015-11-24 ENCOUNTER — Other Ambulatory Visit: Payer: Self-pay | Admitting: Internal Medicine

## 2015-11-24 DIAGNOSIS — C9001 Multiple myeloma in remission: Secondary | ICD-10-CM

## 2015-11-24 NOTE — Telephone Encounter (Signed)
Spoke with patient, she will return for the additional labs, scheduled, faxed prior labs to the number provided. thanks

## 2015-11-24 NOTE — Telephone Encounter (Signed)
Please notify pt that I spoke with April Chapman at Dr Lucianne Lei DeVenter office.  Need to fax labs we have and I need her to come in for another lab.  This will be a non fasting lab.  Lab ordered.  Fax number  [phone].

## 2015-11-27 ENCOUNTER — Other Ambulatory Visit (INDEPENDENT_AMBULATORY_CARE_PROVIDER_SITE_OTHER): Payer: BLUE CROSS/BLUE SHIELD

## 2015-11-27 DIAGNOSIS — C9001 Multiple myeloma in remission: Secondary | ICD-10-CM | POA: Diagnosis not present

## 2015-11-28 LAB — KAPPA/LAMBDA LIGHT CHAINS
KAPPA FREE LGHT CHN: 126 mg/L — AB (ref 3.3–19.4)
KAPPA LAMBDA RATIO: 5.16 — AB (ref 0.26–1.65)
LAMBDA FREE LGHT CHN: 24.4 mg/L (ref 5.7–26.3)

## 2015-11-29 ENCOUNTER — Ambulatory Visit: Payer: Self-pay | Admitting: Podiatry

## 2015-11-29 ENCOUNTER — Encounter: Payer: Self-pay | Admitting: Internal Medicine

## 2015-11-30 MED ORDER — ROSUVASTATIN CALCIUM 5 MG PO TABS: 5.0000 mg | ORAL_TABLET | Freq: Every day | ORAL | 1 refills | Status: DC

## 2015-11-30 NOTE — Telephone Encounter (Signed)
rx sent in for crestor.  She needs a f/u non fasting lab appt scheduled in 6 weeks.  To f/u liver panel.  Let her know I have sent in rx.  Also please let her know that I have forwarded her labs to her hematologist for review.  It appears that just continued periodic checks will be recommended, but I am waiting to hear.  Thanks

## 2015-12-01 ENCOUNTER — Telehealth: Payer: Self-pay | Admitting: *Deleted

## 2015-12-01 NOTE — Telephone Encounter (Signed)
Patient was notified that Dr. Nicki Reaper is awaiting a return call back from her Oncologist

## 2015-12-01 NOTE — Telephone Encounter (Signed)
Patient requested lab results Pt contact [phone]

## 2015-12-04 ENCOUNTER — Telehealth: Payer: Self-pay | Admitting: *Deleted

## 2015-12-04 NOTE — Telephone Encounter (Signed)
Lab results faxed over to [facility]

## 2016-01-09 ENCOUNTER — Telehealth: Payer: Self-pay | Admitting: Internal Medicine

## 2016-01-09 NOTE — Telephone Encounter (Signed)
Pt called needing to make a follow up appt with Dr. Nicki Reaper in about 2-3 weeks. Thank you!  Call pt @ [phone]

## 2016-01-10 ENCOUNTER — Telehealth: Payer: Self-pay | Admitting: *Deleted

## 2016-01-10 NOTE — Telephone Encounter (Signed)
I looked at your schedule, please advise a date and time in 2 weeks, not sure about a 430 slot?  thanks

## 2016-01-10 NOTE — Telephone Encounter (Signed)
Please give a time and date to place pt in 2 weeks for follow on medication change  Pt contact [phone]

## 2016-01-11 NOTE — Telephone Encounter (Signed)
Spoke with the patient, she miss understood the original message, scheduled for a lab appt on November 8th at 915am.  Thanks

## 2016-01-11 NOTE — Telephone Encounter (Signed)
Need clarification on this message.  Reviewed chart.  We started her on crestor.  She was to return for a lab check (liver panel) 6 weeks after starting the medication.  Is this the appt she is wanting or does she need to see me?

## 2016-01-23 ENCOUNTER — Telehealth: Payer: Self-pay

## 2016-01-23 DIAGNOSIS — E7800 Pure hypercholesterolemia, unspecified: Secondary | ICD-10-CM

## 2016-01-23 NOTE — Telephone Encounter (Signed)
Order placed for f/u liver panel.  

## 2016-01-23 NOTE — Telephone Encounter (Signed)
Pt coming for repeat labs 01/24/16. Please place future order. Thank you.

## 2016-01-24 ENCOUNTER — Other Ambulatory Visit (INDEPENDENT_AMBULATORY_CARE_PROVIDER_SITE_OTHER): Payer: BLUE CROSS/BLUE SHIELD

## 2016-01-24 ENCOUNTER — Encounter: Payer: Self-pay | Admitting: Internal Medicine

## 2016-01-24 DIAGNOSIS — E7800 Pure hypercholesterolemia, unspecified: Secondary | ICD-10-CM | POA: Diagnosis not present

## 2016-01-24 LAB — HEPATIC FUNCTION PANEL
ALBUMIN: 4.1 g/dL (ref 3.5–5.2)
ALK PHOS: 52 U/L (ref 39–117)
ALT: 17 U/L (ref 0–35)
AST: 20 U/L (ref 0–37)
Bilirubin, Direct: 0.1 mg/dL (ref 0.0–0.3)
TOTAL PROTEIN: 6.8 g/dL (ref 6.0–8.3)
Total Bilirubin: 0.5 mg/dL (ref 0.2–1.2)

## 2016-02-01 ENCOUNTER — Telehealth: Payer: Self-pay | Admitting: Internal Medicine

## 2016-02-01 MED ORDER — ROSUVASTATIN CALCIUM 5 MG PO TABS: 5.0000 mg | ORAL_TABLET | Freq: Every day | ORAL | 1 refills | Status: DC

## 2016-02-01 NOTE — Telephone Encounter (Signed)
Sent to pharmacy 

## 2016-02-01 NOTE — Telephone Encounter (Signed)
Pt called requesting a refill on rosuvastatin (CRESTOR) 5 MG tablet. Please advise, thank you!  Pharmacy - [place]  Call pt@ [phone]

## 2016-02-03 ENCOUNTER — Other Ambulatory Visit: Payer: Self-pay | Admitting: Internal Medicine

## 2016-02-19 DIAGNOSIS — L570 Actinic keratosis: Secondary | ICD-10-CM | POA: Diagnosis not present

## 2016-02-19 DIAGNOSIS — L821 Other seborrheic keratosis: Secondary | ICD-10-CM | POA: Diagnosis not present

## 2016-02-26 DIAGNOSIS — H2513 Age-related nuclear cataract, bilateral: Secondary | ICD-10-CM | POA: Diagnosis not present

## 2016-02-28 DIAGNOSIS — M19041 Primary osteoarthritis, right hand: Secondary | ICD-10-CM | POA: Diagnosis not present

## 2016-04-02 ENCOUNTER — Telehealth: Payer: Self-pay | Admitting: Internal Medicine

## 2016-04-10 DIAGNOSIS — Z86718 Personal history of other venous thrombosis and embolism: Secondary | ICD-10-CM | POA: Diagnosis not present

## 2016-04-10 DIAGNOSIS — Z6828 Body mass index (BMI) 28.0-28.9, adult: Secondary | ICD-10-CM | POA: Diagnosis not present

## 2016-04-10 DIAGNOSIS — C9000 Multiple myeloma not having achieved remission: Secondary | ICD-10-CM | POA: Diagnosis not present

## 2016-04-10 DIAGNOSIS — Z9484 Stem cells transplant status: Secondary | ICD-10-CM | POA: Diagnosis not present

## 2016-04-10 DIAGNOSIS — Z87442 Personal history of urinary calculi: Secondary | ICD-10-CM | POA: Diagnosis not present

## 2016-04-15 ENCOUNTER — Encounter: Payer: Self-pay | Admitting: Internal Medicine

## 2016-04-15 ENCOUNTER — Ambulatory Visit (INDEPENDENT_AMBULATORY_CARE_PROVIDER_SITE_OTHER): Payer: Medicare Other | Admitting: Internal Medicine

## 2016-04-15 VITALS — BP 118/62 | HR 62 | Temp 98.6°F | Resp 16 | Ht 68.0 in | Wt 189.0 lb

## 2016-04-15 DIAGNOSIS — Z0000 Encounter for general adult medical examination without abnormal findings: Secondary | ICD-10-CM | POA: Diagnosis not present

## 2016-04-15 DIAGNOSIS — E7800 Pure hypercholesterolemia, unspecified: Secondary | ICD-10-CM

## 2016-04-15 DIAGNOSIS — C9001 Multiple myeloma in remission: Secondary | ICD-10-CM | POA: Diagnosis not present

## 2016-04-15 DIAGNOSIS — E0789 Other specified disorders of thyroid: Secondary | ICD-10-CM

## 2016-04-15 DIAGNOSIS — R000 Tachycardia, unspecified: Secondary | ICD-10-CM | POA: Diagnosis not present

## 2016-04-15 DIAGNOSIS — E041 Nontoxic single thyroid nodule: Secondary | ICD-10-CM | POA: Insufficient documentation

## 2016-04-15 LAB — BASIC METABOLIC PANEL
BUN: 19 mg/dL (ref 6–23)
CALCIUM: 9.7 mg/dL (ref 8.4–10.5)
CO2: 28 meq/L (ref 19–32)
CREATININE: 1.01 mg/dL (ref 0.40–1.20)
Chloride: 103 mEq/L (ref 96–112)
GFR: 58.45 mL/min — AB (ref >60.00)
GLUCOSE: 105 mg/dL — AB (ref 70–99)
Potassium: 4.2 mEq/L (ref 3.5–5.1)
Sodium: 138 mEq/L (ref 135–145)

## 2016-04-15 LAB — HEPATIC FUNCTION PANEL
ALBUMIN: 4.4 g/dL (ref 3.5–5.2)
ALT: 14 U/L (ref 0–35)
AST: 19 U/L (ref 0–37)
Alkaline Phosphatase: 58 U/L (ref 39–117)
Bilirubin, Direct: 0.1 mg/dL (ref 0.0–0.3)
Total Bilirubin: 0.6 mg/dL (ref 0.2–1.2)
Total Protein: 7.3 g/dL (ref 6.0–8.3)

## 2016-04-15 LAB — LIPID PANEL
CHOL/HDL RATIO: 3
CHOLESTEROL: 166 mg/dL (ref 0–200)
HDL: 51.6 mg/dL (ref >39.00)
LDL Cholesterol: 93 mg/dL (ref 0–99)
NonHDL: 114.27
Triglycerides: 104 mg/dL (ref 0.0–149.0)
VLDL: 20.8 mg/dL (ref 0.0–40.0)

## 2016-04-15 LAB — TSH: TSH: 0.93 u[IU]/mL (ref 0.35–4.50)

## 2016-04-15 NOTE — Progress Notes (Signed)
Pre-visit discussion using our clinic review tool. No additional management support is needed unless otherwise documented below in the visit note.  

## 2016-04-15 NOTE — Assessment & Plan Note (Addendum)
Physical today 04/15/16.  PAP 05/03/13 - negative with negative HPV.  Declines mammogram and colonoscopy or colon screening.  Discussed cologuard.  Information given.  Will notify me if agreeable.

## 2016-04-15 NOTE — Progress Notes (Signed)
Patient ID: April Chapman, female   DOB: 1951/01/07, 66 y.o.   MRN: [id number]   Subjective:    Patient ID: April Chapman, female    DOB: May 27, 1950, 66 y.o.   MRN: [id number]  HPI  Patient with past history of CKD and multiple myeloma.  She comes in today to follow up on these issues as well as for a complete physical exam.  She was just evaluated by her hematologist 04/10/16.  Stable.  She reports she is doing relatively well.  Does report noticing some increased heart rate and palpitations.  Notices more at night.  May occur once q 2 weeks.  Discussed possible triggers.  No increased caffeine.  She can also hear her heart beat at night.  States rate 104 when checked.  Breathing overall relatively stable.      Past Medical History:  Diagnosis Date  . Chronic kidney disease H/O   stones  . History of chicken pox   . Multiple myeloma (HCC)    s/p stem cell transplant (08/2010)   Past Surgical History:  Procedure Laterality Date  . APPENDECTOMY  03/2015  . NO PAST SURGERIES     Family History  Problem Relation Age of Onset  . Mental illness Mother   . Hypercholesterolemia Sister   . Colon cancer Neg Hx   . Breast cancer Neg Hx    Social History   Social History  . Marital status: Widowed    Spouse name: N/A  . Number of children: 2  . Years of education: N/A   Occupational History  . retired [employer] Topics  . Smoking status: Never Smoker  . Smokeless tobacco: Never Used  . Alcohol use No  . Drug use: No  . Sexual activity: No   Other Topics Concern  . None   Social History Narrative  . None    Outpatient Encounter Prescriptions as of 04/15/2016  Medication Sig  . rosuvastatin (CRESTOR) 5 MG tablet Take 1 tablet (5 mg total) by mouth daily.  . [DISCONTINUED] rosuvastatin (CRESTOR) 5 MG tablet Take 1 tablet (5 mg total) by mouth daily.  . [DISCONTINUED] rosuvastatin (CRESTOR) 5 MG tablet TAKE 1 TABLET (5 MG TOTAL) BY MOUTH DAILY.  (Patient not taking: Reported on 04/15/2016)   No facility-administered encounter medications on file as of 04/15/2016.     Review of Systems  Constitutional: Negative for appetite change and unexpected weight change.  HENT: Negative for congestion and sinus pressure.   Eyes: Negative for pain and visual disturbance.  Respiratory: Negative for cough, chest tightness and shortness of breath.   Cardiovascular: Positive for palpitations. Negative for chest pain and leg swelling.       Increased heart rate.   Gastrointestinal: Negative for abdominal pain, diarrhea, nausea and vomiting.  Genitourinary: Negative for difficulty urinating and dysuria.  Musculoskeletal: Negative for back pain and joint swelling.  Skin: Negative for color change and rash.  Neurological: Negative for dizziness, light-headedness and headaches.  Hematological: Negative for adenopathy. Does not bruise/bleed easily.  Psychiatric/Behavioral: Negative for agitation and dysphoric mood.       Objective:    Physical Exam  Constitutional: She is oriented to person, place, and time. She appears well-developed and well-nourished. No distress.  HENT:  Nose: Nose normal.  Mouth/Throat: Oropharynx is clear and moist.  Eyes: Right eye exhibits no discharge. Left eye exhibits no discharge. No scleral icterus.  Neck: Neck supple. No thyromegaly present.  Cardiovascular: Normal  rate and regular rhythm.   Pulmonary/Chest: Breath sounds normal. No accessory muscle usage. No tachypnea. No respiratory distress. She has no decreased breath sounds. She has no wheezes. She has no rhonchi. Right breast exhibits no inverted nipple, no mass, no nipple discharge and no tenderness (no axillary adenopathy). Left breast exhibits no inverted nipple, no mass, no nipple discharge and no tenderness (no axilarry adenopathy).  Abdominal: Soft. Bowel sounds are normal. There is no tenderness.  Musculoskeletal: She exhibits no edema or tenderness.    Lymphadenopathy:    She has no cervical adenopathy.  Neurological: She is alert and oriented to person, place, and time.  Skin: Skin is warm. No rash noted. No erythema.  Psychiatric: She has a normal mood and affect. Her behavior is normal.    BP 118/62 (BP Location: Left Arm, Patient Position: Sitting, Cuff Size: Large)   Pulse 62   Temp 98.6 F (37 C) (Oral)   Resp 16   Ht _0  (1.727 m)   Wt 189 lb (85.7 kg)   SpO2 98%   BMI 28.74 kg/m  Wt Readings from Last 3 Encounters:  04/15/16 189 lb (85.7 kg)  11/13/15 185 lb (83.9 kg)  10/22/13 178 lb 8 oz (81 kg)     Lab Results  Component Value Date   WBC 5.9 11/13/2015   HGB 13.4 11/13/2015   HCT 39.2 11/13/2015   PLT 172.0 11/13/2015   GLUCOSE 105 (H) 04/15/2016   CHOL 166 04/15/2016   TRIG 104.0 04/15/2016   HDL 51.60 04/15/2016   LDLCALC 93 04/15/2016   ALT 14 04/15/2016   AST 19 04/15/2016   NA 138 04/15/2016   K 4.2 04/15/2016   CL 103 04/15/2016   CREATININE 1.01 04/15/2016   BUN 19 04/15/2016   CO2 28 04/15/2016   TSH 0.93 04/15/2016   INR 1.0 04/01/2014       Assessment & Plan:   Problem List Items Addressed This Visit    Health care maintenance    Physical today 04/15/16.  PAP 05/03/13 - negative with negative HPV.  Declines mammogram and colonoscopy or colon screening.  Discussed cologuard.  Information given.  Will notify me if agreeable.       Hypercholesterolemia    Low cholesterol diet and exercise.  Follow lipid panel and liver function tests.  On crestor.        Relevant Medications   rosuvastatin (CRESTOR) 5 MG tablet   Other Relevant Orders   Lipid panel (Completed)   Hepatic function panel (Completed)   Multiple myeloma in remission ([place])    Followed by hematologist/oncologist.  They are following labs.  Stable.        Relevant Orders   Basic metabolic panel (Completed)   Tachycardia - Primary    EKG revealed SR/SB with PVCs.  No acute ischemic changes.  Check routine labs  including thyroid test.  Refer to cardiology for further evaluation and work up.        Relevant Orders   EKG 12-Lead (Completed)   Ambulatory referral to Cardiology   Thyroid fullness    Discussed thyroid ultrasound.  Will notify me when agreeable to schedule.       Relevant Orders   TSH (Completed)       Einar Pheasant, MD

## 2016-04-16 ENCOUNTER — Encounter: Payer: Self-pay | Admitting: Internal Medicine

## 2016-04-19 ENCOUNTER — Encounter: Payer: Self-pay | Admitting: Internal Medicine

## 2016-04-19 ENCOUNTER — Other Ambulatory Visit: Payer: Self-pay | Admitting: Internal Medicine

## 2016-04-21 ENCOUNTER — Encounter: Payer: Self-pay | Admitting: Internal Medicine

## 2016-04-21 DIAGNOSIS — R000 Tachycardia, unspecified: Secondary | ICD-10-CM | POA: Insufficient documentation

## 2016-04-21 MED ORDER — ROSUVASTATIN CALCIUM 5 MG PO TABS: 5.0000 mg | ORAL_TABLET | Freq: Every day | ORAL | 3 refills | Status: DC

## 2016-04-21 NOTE — Assessment & Plan Note (Signed)
Followed by hematologist/oncologist.  They are following labs.  Stable.

## 2016-04-21 NOTE — Assessment & Plan Note (Signed)
Discussed thyroid ultrasound.  Will notify me when agreeable to schedule.

## 2016-04-21 NOTE — Assessment & Plan Note (Signed)
EKG revealed SR/SB with PVCs.  No acute ischemic changes.  Check routine labs including thyroid test.  Refer to cardiology for further evaluation and work up.

## 2016-04-21 NOTE — Assessment & Plan Note (Signed)
Low cholesterol diet and exercise.  Follow lipid panel and liver function tests.  On crestor.   

## 2016-04-25 ENCOUNTER — Telehealth: Payer: Self-pay | Admitting: *Deleted

## 2016-04-25 NOTE — Telephone Encounter (Signed)
Pt stated that she requested a medication refill for rosuvastatin [facility] has not received  this Rx  Pharmacy [facility] on [address]

## 2016-04-26 ENCOUNTER — Other Ambulatory Visit: Payer: Self-pay

## 2016-04-26 ENCOUNTER — Other Ambulatory Visit: Payer: Self-pay | Admitting: Internal Medicine

## 2016-04-26 DIAGNOSIS — E7800 Pure hypercholesterolemia, unspecified: Secondary | ICD-10-CM

## 2016-04-26 DIAGNOSIS — H2511 Age-related nuclear cataract, right eye: Secondary | ICD-10-CM | POA: Diagnosis not present

## 2016-04-26 NOTE — Telephone Encounter (Signed)
Called into pharmacy

## 2016-04-30 ENCOUNTER — Encounter: Payer: Self-pay | Admitting: *Deleted

## 2016-05-07 DIAGNOSIS — H2513 Age-related nuclear cataract, bilateral: Secondary | ICD-10-CM | POA: Diagnosis not present

## 2016-05-20 DIAGNOSIS — Z1211 Encounter for screening for malignant neoplasm of colon: Secondary | ICD-10-CM | POA: Diagnosis not present

## 2016-05-20 DIAGNOSIS — Z1212 Encounter for screening for malignant neoplasm of rectum: Secondary | ICD-10-CM | POA: Diagnosis not present

## 2016-05-20 LAB — COLOGUARD: Cologuard: NEGATIVE

## 2016-05-28 ENCOUNTER — Ambulatory Visit: Admit: 2016-05-28 | Payer: Medicare Other | Admitting: Ophthalmology

## 2016-05-28 SURGERY — PHACOEMULSIFICATION, CATARACT, WITH IOL INSERTION
Anesthesia: Choice | Laterality: Left

## 2016-05-29 ENCOUNTER — Encounter: Payer: Self-pay | Admitting: Internal Medicine

## 2016-06-04 ENCOUNTER — Ambulatory Visit (INDEPENDENT_AMBULATORY_CARE_PROVIDER_SITE_OTHER): Payer: Medicare Other | Admitting: Family

## 2016-06-04 ENCOUNTER — Encounter: Payer: Self-pay | Admitting: Family

## 2016-06-04 VITALS — BP 106/68 | HR 73 | Temp 98.1°F | Ht 68.0 in | Wt 189.0 lb

## 2016-06-04 DIAGNOSIS — K120 Recurrent oral aphthae: Secondary | ICD-10-CM

## 2016-06-04 MED ORDER — CLOTRIMAZOLE 10 MG MT TROC: 10.0000 mg | Freq: Three times a day (TID) | OROMUCOSAL | 0 refills | Status: AC

## 2016-06-04 NOTE — Progress Notes (Signed)
Pre visit review using our clinic review tool, if applicable. No additional management support is needed unless otherwise documented below in the visit note. 

## 2016-06-04 NOTE — Patient Instructions (Signed)
Due to history of multiple myeloma, I'm inclined to go and start  antifungal for possible yeast infection since sore is under dentures.  Try this for a couple of days, if no improvement, I will call in steriod elixir for you to swish and swallow  If there is no improvement in your symptoms, or if there is any worsening of symptoms, or if you have any additional concerns, please return for re-evaluation; or, if we are closed, consider going to the Emergency Room for evaluation if symptoms urgent.

## 2016-06-04 NOTE — Progress Notes (Signed)
Subjective:    Patient ID: April Chapman, female    DOB: 01-05-1951, 66 y.o.   MRN: [id number]  CC: April Chapman is a 66 y.o. female who presents today for an acute visit.    HPI: CC: bit tongue 2 weeks ago and has 'canker sore', getting better , 'but real slow.' Tried peroxide, rinsed mouth baking soda and hard to see if improved.   Would like something to help it heal so she can have vision surgery for cataracts.   Wears dentures  h/o multiple myeloma -in remission      HISTORY:  Past Medical History:  Diagnosis Date  . Chronic kidney disease H/O   stones  . History of chicken pox   . Multiple myeloma (HCC)    s/p stem cell transplant (08/2010)   Past Surgical History:  Procedure Laterality Date  . APPENDECTOMY  03/2015  . NO PAST SURGERIES     Family History  Problem Relation Age of Onset  . Mental illness Mother   . Hypercholesterolemia Sister   . Colon cancer Neg Hx   . Breast cancer Neg Hx     Allergies: Revlimid [lenalidomide] Current Outpatient Prescriptions on File Prior to Visit  Medication Sig Dispense Refill  . rosuvastatin (CRESTOR) 5 MG tablet TAKE ONE TABLET BY MOUTH ONCE DAILY 90 tablet 1   No current facility-administered medications on file prior to visit.     Social History  Substance Use Topics  . Smoking status: Never Smoker  . Smokeless tobacco: Never Used  . Alcohol use No    Review of Systems  Constitutional: Negative for chills and fever.  HENT: Negative for sore throat.   Respiratory: Negative for cough.   Cardiovascular: Negative for chest pain and palpitations.  Gastrointestinal: Negative for nausea and vomiting.      Objective:    BP 106/68   Pulse 73   Temp 98.1 F (36.7 C) (Oral)   Ht _0  (1.727 m)   Wt 189 lb (85.7 kg)   SpO2 98%   BMI 28.74 kg/m    Physical Exam  Constitutional: She appears well-developed and well-nourished.  HENT:  Mouth/Throat:    Tender indurated lesion proximal to side of  denture. Swollen skin around lesion. No white exudate.   Eyes: Conjunctivae are normal.  Cardiovascular: Normal rate, regular rhythm, normal heart sounds and normal pulses.   Pulmonary/Chest: Effort normal and breath sounds normal. She has no wheezes. She has no rhonchi. She has no rales.  Neurological: She is alert.  Skin: Skin is warm and dry.  Psychiatric: She has a normal mood and affect. Her speech is normal and behavior is normal. Thought content normal.  Vitals reviewed.      Assessment & Plan:  1. Canker sore Working diagnosis of possible secondary candidiasis. Although patient does not have apparent thrush, she is a history of multiple melanoma. I'm inclined to go ahead and try antifungal first to see if this helps, particularly as the sore is located under her denture. However if this does not resolve symptom, then I would prescribe dexamethasone elixir which patient understands.   - clotrimazole (MYCELEX) 10 MG troche; Take 1 tablet (10 mg total) by mouth 3 (three) times daily. Dissolve TID  Dispense: 15 tablet; Refill: 0     I am having Ms. Delpriore maintain her rosuvastatin.   No orders of the defined types were placed in this encounter.   Return precautions given.   Risks, benefits, and  alternatives of the medications and treatment plan prescribed today were discussed, and patient expressed understanding.   Education regarding symptom management and diagnosis given to patient on AVS.  Continue to follow with Einar Pheasant, MD for routine health maintenance.   April Chapman and I agreed with plan.   Mable Paris, FNP

## 2016-06-05 ENCOUNTER — Encounter: Payer: Self-pay | Admitting: Internal Medicine

## 2016-06-26 DIAGNOSIS — C9001 Multiple myeloma in remission: Secondary | ICD-10-CM | POA: Diagnosis not present

## 2016-06-26 DIAGNOSIS — H2512 Age-related nuclear cataract, left eye: Secondary | ICD-10-CM | POA: Diagnosis not present

## 2016-07-02 ENCOUNTER — Ambulatory Visit (INDEPENDENT_AMBULATORY_CARE_PROVIDER_SITE_OTHER): Payer: Medicare Other | Admitting: Internal Medicine

## 2016-07-02 ENCOUNTER — Encounter: Payer: Self-pay | Admitting: Internal Medicine

## 2016-07-02 VITALS — BP 110/68 | HR 69 | Temp 98.6°F | Resp 12 | Ht 68.0 in | Wt 190.2 lb

## 2016-07-02 DIAGNOSIS — C9001 Multiple myeloma in remission: Secondary | ICD-10-CM | POA: Diagnosis not present

## 2016-07-02 DIAGNOSIS — E0789 Other specified disorders of thyroid: Secondary | ICD-10-CM | POA: Diagnosis not present

## 2016-07-02 DIAGNOSIS — E7800 Pure hypercholesterolemia, unspecified: Secondary | ICD-10-CM

## 2016-07-02 NOTE — Progress Notes (Signed)
Patient ID: April Chapman, female   DOB: 26-Nov-1950, 66 y.o.   MRN: [id number]   Subjective:    Patient ID: April Chapman, female    DOB: 1950/03/30, 66 y.o.   MRN: [id number]  HPI  Patient here for a scheduled follow up.  She reports she is doing relatively well.  Recently had cataract surgery.  Tries to stay active.  No chest pain.  No sob.  No acid reflux.  No abdominal pain.  Bowels moving.  No urine change.     Past Medical History:  Diagnosis Date  . Chronic kidney disease H/O   stones  . History of chicken pox   . Multiple myeloma (HCC)    s/p stem cell transplant (08/2010)   Past Surgical History:  Procedure Laterality Date  . APPENDECTOMY  03/2015  . NO PAST SURGERIES     Family History  Problem Relation Age of Onset  . Mental illness Mother   . Hypercholesterolemia Sister   . Colon cancer Neg Hx   . Breast cancer Neg Hx    Social History   Social History  . Marital status: Widowed    Spouse name: N/A  . Number of children: 2  . Years of education: N/A   Occupational History  . retired [employer] Topics  . Smoking status: Never Smoker  . Smokeless tobacco: Never Used  . Alcohol use No  . Drug use: No  . Sexual activity: No   Other Topics Concern  . None   Social History Narrative  . None    Outpatient Encounter Prescriptions as of 07/02/2016  Medication Sig  . flurbiprofen (OCUFEN) 0.03 % ophthalmic solution 1 drop once. Do not use while wearing contact lenses  . levofloxacin (QUIXIN) 0.5 % ophthalmic solution 1 drop every 4 (four) hours.  . prednisoLONE acetate (PRED FORTE) 1 % ophthalmic suspension Administer 1 drop into the left eye Four (4) times a day.  . rosuvastatin (CRESTOR) 5 MG tablet TAKE ONE TABLET BY MOUTH ONCE DAILY   No facility-administered encounter medications on file as of 07/02/2016.     Review of Systems  Constitutional: Negative for appetite change and unexpected weight change.  HENT: Negative  for congestion and sinus pressure.   Respiratory: Negative for cough, chest tightness and shortness of breath.   Cardiovascular: Negative for chest pain, palpitations and leg swelling.  Gastrointestinal: Negative for abdominal pain, diarrhea, nausea and vomiting.  Genitourinary: Negative for difficulty urinating and dysuria.  Musculoskeletal: Negative for back pain and joint swelling.  Skin: Negative for color change and rash.  Neurological: Negative for dizziness, light-headedness and headaches.  Psychiatric/Behavioral: Negative for agitation and dysphoric mood.       Objective:    Physical Exam  Constitutional: She appears well-developed and well-nourished. No distress.  HENT:  Nose: Nose normal.  Mouth/Throat: Oropharynx is clear and moist.  Neck: Neck supple.  Thyroid fullness.   Cardiovascular: Normal rate and regular rhythm.   Pulmonary/Chest: Breath sounds normal. No respiratory distress. She has no wheezes.  Abdominal: Soft. Bowel sounds are normal. There is no tenderness.  Musculoskeletal: She exhibits no edema or tenderness.  Lymphadenopathy:    She has no cervical adenopathy.  Skin: No rash noted. No erythema.  Psychiatric: She has a normal mood and affect. Her behavior is normal.    BP 110/68 (BP Location: Left Arm, Patient Position: Sitting, Cuff Size: Normal)   Pulse 69   Temp 98.6 F (37  C) (Oral)   Resp 12   Ht '5\' 8"'  (1.727 m)   Wt 190 lb 3.2 oz (86.3 kg)   SpO2 98%   BMI 28.92 kg/m  Wt Readings from Last 3 Encounters:  07/02/16 190 lb 3.2 oz (86.3 kg)  06/04/16 189 lb (85.7 kg)  04/15/16 189 lb (85.7 kg)     Lab Results  Component Value Date   WBC 5.9 11/13/2015   HGB 13.4 11/13/2015   HCT 39.2 11/13/2015   PLT 172.0 11/13/2015   GLUCOSE 105 (H) 04/15/2016   CHOL 166 04/15/2016   TRIG 104.0 04/15/2016   HDL 51.60 04/15/2016   LDLCALC 93 04/15/2016   ALT 14 04/15/2016   AST 19 04/15/2016   NA 138 04/15/2016   K 4.2 04/15/2016   CL 103  04/15/2016   CREATININE 1.01 04/15/2016   BUN 19 04/15/2016   CO2 28 04/15/2016   TSH 0.93 04/15/2016   INR 1.0 04/01/2014       Assessment & Plan:   Problem List Items Addressed This Visit    Hypercholesterolemia    Low cholesterol diet and exercise.  On crestor.  Follow lipid panel and liver function tests.        Relevant Orders   Hepatic function panel   Lipid panel   Multiple myeloma ([place])    s/p stem cell transplant in 08/2010.  Followed at the cancer center.        Relevant Orders   CBC with Differential/Platelet   Basic metabolic panel   Thyroid fullness - Primary    Schedule her for thyroid ultrasound.        Relevant Orders   US THYROID (Completed)   TSH       April Pheasant, MD

## 2016-07-02 NOTE — Progress Notes (Signed)
Pre-visit discussion using our clinic review tool. No additional management support is needed unless otherwise documented below in the visit note.  

## 2016-07-10 DIAGNOSIS — C9000 Multiple myeloma not having achieved remission: Secondary | ICD-10-CM | POA: Diagnosis not present

## 2016-07-11 ENCOUNTER — Ambulatory Visit
Admission: RE | Admit: 2016-07-11 | Discharge: 2016-07-11 | Disposition: A | Discharge status: Home / Self Care | Payer: Medicare Other | Source: Ambulatory Visit | Attending: Internal Medicine | Admitting: Internal Medicine

## 2016-07-11 DIAGNOSIS — E0789 Other specified disorders of thyroid: Secondary | ICD-10-CM

## 2016-07-11 DIAGNOSIS — E041 Nontoxic single thyroid nodule: Secondary | ICD-10-CM | POA: Diagnosis not present

## 2016-07-12 ENCOUNTER — Other Ambulatory Visit: Payer: Self-pay | Admitting: Internal Medicine

## 2016-07-12 ENCOUNTER — Telehealth: Payer: Self-pay | Admitting: Internal Medicine

## 2016-07-12 DIAGNOSIS — E041 Nontoxic single thyroid nodule: Secondary | ICD-10-CM

## 2016-07-12 NOTE — Telephone Encounter (Signed)
Pt called in regards to referral to endocrinology. Pt states that she would like to go to someone in [place] that we can recommend since she goes there for her cancer treatment.

## 2016-07-12 NOTE — Progress Notes (Signed)
Order placed for endocrinology referral.  

## 2016-07-15 ENCOUNTER — Encounter: Payer: Self-pay | Admitting: Internal Medicine

## 2016-07-15 NOTE — Assessment & Plan Note (Signed)
Schedule her for thyroid ultrasound.

## 2016-07-15 NOTE — Assessment & Plan Note (Signed)
s/p stem cell transplant in 08/2010.  Followed at the cancer center.

## 2016-07-15 NOTE — Assessment & Plan Note (Signed)
Low cholesterol diet and exercise.  On crestor.  Follow lipid panel and liver function tests.  

## 2016-08-02 DIAGNOSIS — C9000 Multiple myeloma not having achieved remission: Secondary | ICD-10-CM | POA: Diagnosis not present

## 2016-08-02 DIAGNOSIS — N289 Disorder of kidney and ureter, unspecified: Secondary | ICD-10-CM | POA: Diagnosis not present

## 2016-08-02 DIAGNOSIS — E041 Nontoxic single thyroid nodule: Secondary | ICD-10-CM | POA: Diagnosis not present

## 2016-08-02 DIAGNOSIS — M810 Age-related osteoporosis without current pathological fracture: Secondary | ICD-10-CM | POA: Diagnosis not present

## 2016-08-02 DIAGNOSIS — M8580 Other specified disorders of bone density and structure, unspecified site: Secondary | ICD-10-CM | POA: Diagnosis not present

## 2016-08-02 DIAGNOSIS — Z9849 Cataract extraction status, unspecified eye: Secondary | ICD-10-CM | POA: Diagnosis not present

## 2016-08-02 DIAGNOSIS — E7800 Pure hypercholesterolemia, unspecified: Secondary | ICD-10-CM | POA: Diagnosis not present

## 2016-08-02 DIAGNOSIS — Z87442 Personal history of urinary calculi: Secondary | ICD-10-CM | POA: Diagnosis not present

## 2016-08-02 DIAGNOSIS — Z6828 Body mass index (BMI) 28.0-28.9, adult: Secondary | ICD-10-CM | POA: Diagnosis not present

## 2016-08-21 DIAGNOSIS — C9002 Multiple myeloma in relapse: Secondary | ICD-10-CM | POA: Diagnosis not present

## 2016-08-21 DIAGNOSIS — Z6828 Body mass index (BMI) 28.0-28.9, adult: Secondary | ICD-10-CM | POA: Diagnosis not present

## 2016-08-21 DIAGNOSIS — C9000 Multiple myeloma not having achieved remission: Secondary | ICD-10-CM | POA: Diagnosis not present

## 2016-08-26 DIAGNOSIS — C9001 Multiple myeloma in remission: Secondary | ICD-10-CM | POA: Diagnosis not present

## 2016-08-26 DIAGNOSIS — H2512 Age-related nuclear cataract, left eye: Secondary | ICD-10-CM | POA: Diagnosis not present

## 2016-08-26 DIAGNOSIS — M810 Age-related osteoporosis without current pathological fracture: Secondary | ICD-10-CM | POA: Diagnosis not present

## 2016-08-26 DIAGNOSIS — E041 Nontoxic single thyroid nodule: Secondary | ICD-10-CM | POA: Diagnosis not present

## 2016-08-28 ENCOUNTER — Telehealth: Payer: Self-pay | Admitting: Internal Medicine

## 2016-08-28 ENCOUNTER — Encounter: Payer: Self-pay | Admitting: Internal Medicine

## 2016-08-28 NOTE — Telephone Encounter (Signed)
Place form in quick sign if you agree.

## 2016-08-28 NOTE — Telephone Encounter (Signed)
Pt called and is requesting that Dr. Nicki Reaper fill out a handicap parking permit for her. Pt states that her multiple myeloma is back and it would be much easier to have the handicap permit for when she starts her chemo. Please advise, thank you!  Call pt  [phone]

## 2016-08-28 NOTE — Telephone Encounter (Signed)
She sent me a my chart message also, that I forwarded to [facility].  I have signed her form and placed in box for [name].

## 2016-08-28 NOTE — Telephone Encounter (Signed)
Form signed and placed in box.   

## 2016-08-30 DIAGNOSIS — R809 Proteinuria, unspecified: Secondary | ICD-10-CM | POA: Diagnosis not present

## 2016-08-30 DIAGNOSIS — N183 Chronic kidney disease, stage 3 (moderate): Secondary | ICD-10-CM | POA: Diagnosis not present

## 2016-09-03 DIAGNOSIS — M5136 Other intervertebral disc degeneration, lumbar region: Secondary | ICD-10-CM | POA: Diagnosis not present

## 2016-09-03 DIAGNOSIS — C9000 Multiple myeloma not having achieved remission: Secondary | ICD-10-CM | POA: Diagnosis not present

## 2016-09-03 DIAGNOSIS — M5134 Other intervertebral disc degeneration, thoracic region: Secondary | ICD-10-CM | POA: Diagnosis not present

## 2016-09-03 DIAGNOSIS — C9002 Multiple myeloma in relapse: Secondary | ICD-10-CM | POA: Diagnosis not present

## 2016-09-03 DIAGNOSIS — M5135 Other intervertebral disc degeneration, thoracolumbar region: Secondary | ICD-10-CM | POA: Diagnosis not present

## 2016-09-03 DIAGNOSIS — Z888 Allergy status to other drugs, medicaments and biological substances status: Secondary | ICD-10-CM | POA: Diagnosis not present

## 2016-09-03 DIAGNOSIS — Z452 Encounter for adjustment and management of vascular access device: Secondary | ICD-10-CM | POA: Diagnosis not present

## 2016-09-03 DIAGNOSIS — M5030 Other cervical disc degeneration, unspecified cervical region: Secondary | ICD-10-CM | POA: Diagnosis not present

## 2016-09-05 DIAGNOSIS — D649 Anemia, unspecified: Secondary | ICD-10-CM | POA: Diagnosis not present

## 2016-09-05 DIAGNOSIS — Z86718 Personal history of other venous thrombosis and embolism: Secondary | ICD-10-CM | POA: Diagnosis not present

## 2016-09-05 DIAGNOSIS — Z87442 Personal history of urinary calculi: Secondary | ICD-10-CM | POA: Diagnosis not present

## 2016-09-05 DIAGNOSIS — Z8579 Personal history of other malignant neoplasms of lymphoid, hematopoietic and related tissues: Secondary | ICD-10-CM | POA: Diagnosis not present

## 2016-09-05 DIAGNOSIS — C9001 Multiple myeloma in remission: Secondary | ICD-10-CM | POA: Diagnosis not present

## 2016-09-06 DIAGNOSIS — C9001 Multiple myeloma in remission: Secondary | ICD-10-CM | POA: Diagnosis not present

## 2016-09-06 DIAGNOSIS — Z5112 Encounter for antineoplastic immunotherapy: Secondary | ICD-10-CM | POA: Diagnosis not present

## 2016-09-06 DIAGNOSIS — Z6828 Body mass index (BMI) 28.0-28.9, adult: Secondary | ICD-10-CM | POA: Diagnosis not present

## 2016-09-13 DIAGNOSIS — C9001 Multiple myeloma in remission: Secondary | ICD-10-CM | POA: Diagnosis not present

## 2016-09-13 DIAGNOSIS — Z5112 Encounter for antineoplastic immunotherapy: Secondary | ICD-10-CM | POA: Diagnosis not present

## 2016-09-20 DIAGNOSIS — C9001 Multiple myeloma in remission: Secondary | ICD-10-CM | POA: Diagnosis not present

## 2016-09-20 DIAGNOSIS — Z79899 Other long term (current) drug therapy: Secondary | ICD-10-CM | POA: Diagnosis not present

## 2016-09-20 DIAGNOSIS — Z5112 Encounter for antineoplastic immunotherapy: Secondary | ICD-10-CM | POA: Diagnosis not present

## 2016-09-27 DIAGNOSIS — C9001 Multiple myeloma in remission: Secondary | ICD-10-CM | POA: Diagnosis not present

## 2016-09-27 DIAGNOSIS — Z5112 Encounter for antineoplastic immunotherapy: Secondary | ICD-10-CM | POA: Diagnosis not present

## 2016-10-03 DIAGNOSIS — Z6828 Body mass index (BMI) 28.0-28.9, adult: Secondary | ICD-10-CM | POA: Diagnosis not present

## 2016-10-03 DIAGNOSIS — Z86718 Personal history of other venous thrombosis and embolism: Secondary | ICD-10-CM | POA: Diagnosis not present

## 2016-10-03 DIAGNOSIS — C9002 Multiple myeloma in relapse: Secondary | ICD-10-CM | POA: Diagnosis not present

## 2016-10-03 DIAGNOSIS — Z9484 Stem cells transplant status: Secondary | ICD-10-CM | POA: Diagnosis not present

## 2016-10-03 DIAGNOSIS — R197 Diarrhea, unspecified: Secondary | ICD-10-CM | POA: Diagnosis not present

## 2016-10-03 DIAGNOSIS — C9001 Multiple myeloma in remission: Secondary | ICD-10-CM | POA: Diagnosis not present

## 2016-10-03 DIAGNOSIS — Z8579 Personal history of other malignant neoplasms of lymphoid, hematopoietic and related tissues: Secondary | ICD-10-CM | POA: Diagnosis not present

## 2016-10-14 DIAGNOSIS — C9000 Multiple myeloma not having achieved remission: Secondary | ICD-10-CM | POA: Diagnosis not present

## 2016-10-14 DIAGNOSIS — Z52011 Autologous donor, stem cells: Secondary | ICD-10-CM | POA: Diagnosis not present

## 2016-10-14 DIAGNOSIS — Z7682 Awaiting organ transplant status: Secondary | ICD-10-CM | POA: Diagnosis not present

## 2016-10-15 DIAGNOSIS — Z9842 Cataract extraction status, left eye: Secondary | ICD-10-CM | POA: Diagnosis not present

## 2016-10-15 DIAGNOSIS — C9000 Multiple myeloma not having achieved remission: Secondary | ICD-10-CM | POA: Diagnosis not present

## 2016-10-15 DIAGNOSIS — Z7682 Awaiting organ transplant status: Secondary | ICD-10-CM | POA: Diagnosis not present

## 2016-10-15 DIAGNOSIS — I361 Nonrheumatic tricuspid (valve) insufficiency: Secondary | ICD-10-CM | POA: Diagnosis not present

## 2016-10-15 DIAGNOSIS — H2512 Age-related nuclear cataract, left eye: Secondary | ICD-10-CM | POA: Diagnosis not present

## 2016-10-15 DIAGNOSIS — C9001 Multiple myeloma in remission: Secondary | ICD-10-CM | POA: Diagnosis not present

## 2016-10-15 DIAGNOSIS — Z961 Presence of intraocular lens: Secondary | ICD-10-CM | POA: Diagnosis not present

## 2016-10-17 DIAGNOSIS — Z86718 Personal history of other venous thrombosis and embolism: Secondary | ICD-10-CM | POA: Diagnosis not present

## 2016-10-17 DIAGNOSIS — R799 Abnormal finding of blood chemistry, unspecified: Secondary | ICD-10-CM | POA: Diagnosis not present

## 2016-10-17 DIAGNOSIS — Z7682 Awaiting organ transplant status: Secondary | ICD-10-CM | POA: Diagnosis not present

## 2016-10-17 DIAGNOSIS — C9002 Multiple myeloma in relapse: Secondary | ICD-10-CM | POA: Diagnosis not present

## 2016-10-17 DIAGNOSIS — C9001 Multiple myeloma in remission: Secondary | ICD-10-CM | POA: Diagnosis not present

## 2016-10-17 DIAGNOSIS — Z6828 Body mass index (BMI) 28.0-28.9, adult: Secondary | ICD-10-CM | POA: Diagnosis not present

## 2016-10-29 ENCOUNTER — Other Ambulatory Visit: Payer: Self-pay

## 2016-10-29 ENCOUNTER — Telehealth: Payer: Self-pay

## 2016-10-29 DIAGNOSIS — L239 Allergic contact dermatitis, unspecified cause: Secondary | ICD-10-CM | POA: Diagnosis not present

## 2016-10-29 MED ORDER — ROSUVASTATIN CALCIUM 5 MG PO TABS: 5.0000 mg | ORAL_TABLET | Freq: Every day | ORAL | 0 refills | Status: DC

## 2016-10-29 NOTE — Telephone Encounter (Signed)
Pt called right eye was itching yesterday. When she woke up this am eye swollen shut. She has been doing warm compresses this am and she is able to open just a tad. She is having d/c from eye and very itchy. She has a small rash that is starting to come up on her wrist and ankle area. Thinks it is all poison oak. I have advised to call eye doctor and see if she can be evaluated by them today after talking to [facility]. Patient has agreed. She will call our office if any problems.

## 2016-10-29 NOTE — Telephone Encounter (Signed)
Please f/u and make sure is evaluated.

## 2016-10-29 NOTE — Telephone Encounter (Signed)
Called was seen by eye doctor if not better she will call office.

## 2016-10-30 DIAGNOSIS — C9001 Multiple myeloma in remission: Secondary | ICD-10-CM | POA: Diagnosis not present

## 2016-10-30 DIAGNOSIS — Z6828 Body mass index (BMI) 28.0-28.9, adult: Secondary | ICD-10-CM | POA: Diagnosis not present

## 2016-10-30 DIAGNOSIS — C9000 Multiple myeloma not having achieved remission: Secondary | ICD-10-CM | POA: Diagnosis not present

## 2016-10-30 DIAGNOSIS — C9002 Multiple myeloma in relapse: Secondary | ICD-10-CM | POA: Diagnosis not present

## 2016-10-30 DIAGNOSIS — Z885 Allergy status to narcotic agent status: Secondary | ICD-10-CM | POA: Diagnosis not present

## 2016-10-30 DIAGNOSIS — Z452 Encounter for adjustment and management of vascular access device: Secondary | ICD-10-CM | POA: Diagnosis not present

## 2016-10-31 DIAGNOSIS — C9002 Multiple myeloma in relapse: Secondary | ICD-10-CM | POA: Diagnosis not present

## 2016-10-31 DIAGNOSIS — Z7189 Other specified counseling: Secondary | ICD-10-CM | POA: Diagnosis not present

## 2016-10-31 DIAGNOSIS — Z9221 Personal history of antineoplastic chemotherapy: Secondary | ICD-10-CM | POA: Diagnosis not present

## 2016-11-04 DIAGNOSIS — C9000 Multiple myeloma not having achieved remission: Secondary | ICD-10-CM | POA: Diagnosis not present

## 2016-11-05 ENCOUNTER — Other Ambulatory Visit: Payer: Medicare Other

## 2016-11-05 DIAGNOSIS — D61810 Antineoplastic chemotherapy induced pancytopenia: Secondary | ICD-10-CM | POA: Diagnosis not present

## 2016-11-05 DIAGNOSIS — C9002 Multiple myeloma in relapse: Secondary | ICD-10-CM | POA: Diagnosis not present

## 2016-11-05 DIAGNOSIS — D709 Neutropenia, unspecified: Secondary | ICD-10-CM | POA: Diagnosis not present

## 2016-11-05 DIAGNOSIS — R509 Fever, unspecified: Secondary | ICD-10-CM | POA: Diagnosis not present

## 2016-11-05 DIAGNOSIS — B1920 Unspecified viral hepatitis C without hepatic coma: Secondary | ICD-10-CM | POA: Diagnosis present

## 2016-11-05 DIAGNOSIS — R5081 Fever presenting with conditions classified elsewhere: Secondary | ICD-10-CM | POA: Diagnosis not present

## 2016-11-05 DIAGNOSIS — R112 Nausea with vomiting, unspecified: Secondary | ICD-10-CM | POA: Diagnosis not present

## 2016-11-05 DIAGNOSIS — R110 Nausea: Secondary | ICD-10-CM | POA: Diagnosis not present

## 2016-11-05 DIAGNOSIS — I4581 Long QT syndrome: Secondary | ICD-10-CM | POA: Diagnosis not present

## 2016-11-05 DIAGNOSIS — Z9481 Bone marrow transplant status: Secondary | ICD-10-CM | POA: Diagnosis not present

## 2016-11-05 DIAGNOSIS — R109 Unspecified abdominal pain: Secondary | ICD-10-CM | POA: Diagnosis not present

## 2016-11-05 DIAGNOSIS — F419 Anxiety disorder, unspecified: Secondary | ICD-10-CM | POA: Diagnosis present

## 2016-11-05 DIAGNOSIS — C9001 Multiple myeloma in remission: Secondary | ICD-10-CM | POA: Diagnosis present

## 2016-11-05 DIAGNOSIS — Z452 Encounter for adjustment and management of vascular access device: Secondary | ICD-10-CM | POA: Diagnosis not present

## 2016-11-05 DIAGNOSIS — R9431 Abnormal electrocardiogram [ECG] [EKG]: Secondary | ICD-10-CM | POA: Diagnosis not present

## 2016-11-05 DIAGNOSIS — R768 Other specified abnormal immunological findings in serum: Secondary | ICD-10-CM | POA: Diagnosis not present

## 2016-11-05 DIAGNOSIS — D72829 Elevated white blood cell count, unspecified: Secondary | ICD-10-CM | POA: Diagnosis present

## 2016-11-05 DIAGNOSIS — I499 Cardiac arrhythmia, unspecified: Secondary | ICD-10-CM | POA: Diagnosis not present

## 2016-11-05 DIAGNOSIS — Z9484 Stem cells transplant status: Secondary | ICD-10-CM | POA: Diagnosis not present

## 2016-11-05 DIAGNOSIS — I493 Ventricular premature depolarization: Secondary | ICD-10-CM | POA: Diagnosis not present

## 2016-11-05 DIAGNOSIS — Z01818 Encounter for other preprocedural examination: Secondary | ICD-10-CM | POA: Diagnosis not present

## 2016-11-05 DIAGNOSIS — E876 Hypokalemia: Secondary | ICD-10-CM | POA: Diagnosis not present

## 2016-11-05 DIAGNOSIS — T451X5A Adverse effect of antineoplastic and immunosuppressive drugs, initial encounter: Secondary | ICD-10-CM | POA: Diagnosis not present

## 2016-11-05 DIAGNOSIS — E7800 Pure hypercholesterolemia, unspecified: Secondary | ICD-10-CM | POA: Diagnosis present

## 2016-11-05 DIAGNOSIS — R1111 Vomiting without nausea: Secondary | ICD-10-CM | POA: Diagnosis not present

## 2016-11-05 DIAGNOSIS — K5900 Constipation, unspecified: Secondary | ICD-10-CM | POA: Diagnosis not present

## 2016-11-05 DIAGNOSIS — R197 Diarrhea, unspecified: Secondary | ICD-10-CM | POA: Diagnosis not present

## 2016-11-08 ENCOUNTER — Ambulatory Visit: Payer: Medicare Other | Admitting: Internal Medicine

## 2016-11-25 DIAGNOSIS — B1920 Unspecified viral hepatitis C without hepatic coma: Secondary | ICD-10-CM | POA: Diagnosis not present

## 2016-11-25 DIAGNOSIS — C9001 Multiple myeloma in remission: Secondary | ICD-10-CM | POA: Diagnosis not present

## 2016-11-25 DIAGNOSIS — Z6827 Body mass index (BMI) 27.0-27.9, adult: Secondary | ICD-10-CM | POA: Diagnosis not present

## 2016-11-25 DIAGNOSIS — Z888 Allergy status to other drugs, medicaments and biological substances status: Secondary | ICD-10-CM | POA: Diagnosis not present

## 2016-11-25 DIAGNOSIS — R110 Nausea: Secondary | ICD-10-CM | POA: Diagnosis not present

## 2016-11-25 DIAGNOSIS — Z9484 Stem cells transplant status: Secondary | ICD-10-CM | POA: Diagnosis not present

## 2016-11-25 DIAGNOSIS — Z79899 Other long term (current) drug therapy: Secondary | ICD-10-CM | POA: Diagnosis not present

## 2016-11-25 DIAGNOSIS — Z792 Long term (current) use of antibiotics: Secondary | ICD-10-CM | POA: Diagnosis not present

## 2016-11-25 DIAGNOSIS — R197 Diarrhea, unspecified: Secondary | ICD-10-CM | POA: Diagnosis not present

## 2016-11-25 DIAGNOSIS — Z9225 Personal history of immunosupression therapy: Secondary | ICD-10-CM | POA: Diagnosis not present

## 2016-11-25 DIAGNOSIS — I959 Hypotension, unspecified: Secondary | ICD-10-CM | POA: Diagnosis not present

## 2016-11-27 DIAGNOSIS — E7800 Pure hypercholesterolemia, unspecified: Secondary | ICD-10-CM | POA: Diagnosis not present

## 2016-11-27 DIAGNOSIS — R1013 Epigastric pain: Secondary | ICD-10-CM | POA: Diagnosis not present

## 2016-11-27 DIAGNOSIS — C9000 Multiple myeloma not having achieved remission: Secondary | ICD-10-CM | POA: Diagnosis not present

## 2016-11-27 DIAGNOSIS — R12 Heartburn: Secondary | ICD-10-CM | POA: Diagnosis not present

## 2016-11-27 DIAGNOSIS — R110 Nausea: Secondary | ICD-10-CM | POA: Diagnosis not present

## 2016-11-27 DIAGNOSIS — R197 Diarrhea, unspecified: Secondary | ICD-10-CM | POA: Diagnosis not present

## 2016-11-27 DIAGNOSIS — C9001 Multiple myeloma in remission: Secondary | ICD-10-CM | POA: Diagnosis not present

## 2016-11-27 DIAGNOSIS — Z9484 Stem cells transplant status: Secondary | ICD-10-CM | POA: Diagnosis not present

## 2016-11-27 DIAGNOSIS — Z888 Allergy status to other drugs, medicaments and biological substances status: Secondary | ICD-10-CM | POA: Diagnosis not present

## 2016-11-27 DIAGNOSIS — E871 Hypo-osmolality and hyponatremia: Secondary | ICD-10-CM | POA: Diagnosis not present

## 2016-11-27 DIAGNOSIS — R5383 Other fatigue: Secondary | ICD-10-CM | POA: Diagnosis not present

## 2016-12-02 DIAGNOSIS — C9001 Multiple myeloma in remission: Secondary | ICD-10-CM | POA: Diagnosis not present

## 2016-12-02 DIAGNOSIS — Z452 Encounter for adjustment and management of vascular access device: Secondary | ICD-10-CM | POA: Diagnosis not present

## 2016-12-02 DIAGNOSIS — C9000 Multiple myeloma not having achieved remission: Secondary | ICD-10-CM | POA: Diagnosis not present

## 2016-12-02 DIAGNOSIS — Z6827 Body mass index (BMI) 27.0-27.9, adult: Secondary | ICD-10-CM | POA: Diagnosis not present

## 2016-12-07 DIAGNOSIS — R000 Tachycardia, unspecified: Secondary | ICD-10-CM | POA: Diagnosis not present

## 2016-12-07 DIAGNOSIS — D696 Thrombocytopenia, unspecified: Secondary | ICD-10-CM | POA: Diagnosis not present

## 2016-12-07 DIAGNOSIS — R110 Nausea: Secondary | ICD-10-CM | POA: Diagnosis not present

## 2016-12-07 DIAGNOSIS — Z79899 Other long term (current) drug therapy: Secondary | ICD-10-CM | POA: Diagnosis not present

## 2016-12-07 DIAGNOSIS — M1990 Unspecified osteoarthritis, unspecified site: Secondary | ICD-10-CM | POA: Diagnosis not present

## 2016-12-07 DIAGNOSIS — Z888 Allergy status to other drugs, medicaments and biological substances status: Secondary | ICD-10-CM | POA: Diagnosis not present

## 2016-12-07 DIAGNOSIS — Z9481 Bone marrow transplant status: Secondary | ICD-10-CM | POA: Diagnosis not present

## 2016-12-07 DIAGNOSIS — Z6827 Body mass index (BMI) 27.0-27.9, adult: Secondary | ICD-10-CM | POA: Diagnosis not present

## 2016-12-07 DIAGNOSIS — K219 Gastro-esophageal reflux disease without esophagitis: Secondary | ICD-10-CM | POA: Diagnosis not present

## 2016-12-07 DIAGNOSIS — R197 Diarrhea, unspecified: Secondary | ICD-10-CM | POA: Diagnosis not present

## 2016-12-07 DIAGNOSIS — D649 Anemia, unspecified: Secondary | ICD-10-CM | POA: Diagnosis not present

## 2016-12-07 DIAGNOSIS — C9000 Multiple myeloma not having achieved remission: Secondary | ICD-10-CM | POA: Diagnosis not present

## 2016-12-07 DIAGNOSIS — E7800 Pure hypercholesterolemia, unspecified: Secondary | ICD-10-CM | POA: Diagnosis not present

## 2016-12-07 DIAGNOSIS — R1013 Epigastric pain: Secondary | ICD-10-CM | POA: Diagnosis not present

## 2016-12-08 DIAGNOSIS — R9431 Abnormal electrocardiogram [ECG] [EKG]: Secondary | ICD-10-CM | POA: Diagnosis not present

## 2016-12-08 DIAGNOSIS — R109 Unspecified abdominal pain: Secondary | ICD-10-CM | POA: Diagnosis not present

## 2016-12-08 DIAGNOSIS — R1013 Epigastric pain: Secondary | ICD-10-CM | POA: Diagnosis not present

## 2016-12-08 DIAGNOSIS — Z452 Encounter for adjustment and management of vascular access device: Secondary | ICD-10-CM | POA: Diagnosis not present

## 2016-12-08 DIAGNOSIS — C9000 Multiple myeloma not having achieved remission: Secondary | ICD-10-CM | POA: Diagnosis not present

## 2016-12-08 DIAGNOSIS — Z9484 Stem cells transplant status: Secondary | ICD-10-CM | POA: Diagnosis not present

## 2016-12-09 DIAGNOSIS — Z9484 Stem cells transplant status: Secondary | ICD-10-CM | POA: Diagnosis not present

## 2016-12-09 DIAGNOSIS — R1013 Epigastric pain: Secondary | ICD-10-CM | POA: Diagnosis not present

## 2016-12-09 DIAGNOSIS — C9100 Acute lymphoblastic leukemia not having achieved remission: Secondary | ICD-10-CM | POA: Diagnosis not present

## 2016-12-09 DIAGNOSIS — Z6827 Body mass index (BMI) 27.0-27.9, adult: Secondary | ICD-10-CM | POA: Diagnosis not present

## 2016-12-12 DIAGNOSIS — R1013 Epigastric pain: Secondary | ICD-10-CM | POA: Diagnosis not present

## 2016-12-12 DIAGNOSIS — D849 Immunodeficiency, unspecified: Secondary | ICD-10-CM | POA: Diagnosis not present

## 2016-12-12 DIAGNOSIS — K219 Gastro-esophageal reflux disease without esophagitis: Secondary | ICD-10-CM | POA: Diagnosis not present

## 2016-12-12 DIAGNOSIS — D696 Thrombocytopenia, unspecified: Secondary | ICD-10-CM | POA: Diagnosis not present

## 2016-12-12 DIAGNOSIS — Z79899 Other long term (current) drug therapy: Secondary | ICD-10-CM | POA: Diagnosis not present

## 2016-12-12 DIAGNOSIS — C9001 Multiple myeloma in remission: Secondary | ICD-10-CM | POA: Diagnosis not present

## 2016-12-12 DIAGNOSIS — Z9484 Stem cells transplant status: Secondary | ICD-10-CM | POA: Diagnosis not present

## 2016-12-12 DIAGNOSIS — E876 Hypokalemia: Secondary | ICD-10-CM | POA: Diagnosis not present

## 2016-12-12 DIAGNOSIS — Z6827 Body mass index (BMI) 27.0-27.9, adult: Secondary | ICD-10-CM | POA: Diagnosis not present

## 2016-12-12 DIAGNOSIS — D649 Anemia, unspecified: Secondary | ICD-10-CM | POA: Diagnosis not present

## 2016-12-17 NOTE — Telephone Encounter (Signed)
error 

## 2016-12-19 DIAGNOSIS — R432 Parageusia: Secondary | ICD-10-CM | POA: Diagnosis not present

## 2016-12-19 DIAGNOSIS — E876 Hypokalemia: Secondary | ICD-10-CM | POA: Diagnosis not present

## 2016-12-19 DIAGNOSIS — Z9484 Stem cells transplant status: Secondary | ICD-10-CM | POA: Diagnosis not present

## 2016-12-19 DIAGNOSIS — R61 Generalized hyperhidrosis: Secondary | ICD-10-CM | POA: Diagnosis not present

## 2016-12-19 DIAGNOSIS — C9001 Multiple myeloma in remission: Secondary | ICD-10-CM | POA: Diagnosis not present

## 2016-12-19 DIAGNOSIS — R768 Other specified abnormal immunological findings in serum: Secondary | ICD-10-CM | POA: Diagnosis not present

## 2016-12-19 DIAGNOSIS — Z79899 Other long term (current) drug therapy: Secondary | ICD-10-CM | POA: Diagnosis not present

## 2016-12-19 DIAGNOSIS — K219 Gastro-esophageal reflux disease without esophagitis: Secondary | ICD-10-CM | POA: Diagnosis not present

## 2016-12-19 DIAGNOSIS — E041 Nontoxic single thyroid nodule: Secondary | ICD-10-CM | POA: Diagnosis not present

## 2016-12-19 DIAGNOSIS — Z48298 Encounter for aftercare following other organ transplant: Secondary | ICD-10-CM | POA: Diagnosis not present

## 2016-12-19 DIAGNOSIS — D649 Anemia, unspecified: Secondary | ICD-10-CM | POA: Diagnosis not present

## 2016-12-19 DIAGNOSIS — R1013 Epigastric pain: Secondary | ICD-10-CM | POA: Diagnosis not present

## 2016-12-19 DIAGNOSIS — Z6826 Body mass index (BMI) 26.0-26.9, adult: Secondary | ICD-10-CM | POA: Diagnosis not present

## 2016-12-31 DIAGNOSIS — C9001 Multiple myeloma in remission: Secondary | ICD-10-CM | POA: Diagnosis not present

## 2016-12-31 DIAGNOSIS — Z9484 Stem cells transplant status: Secondary | ICD-10-CM | POA: Diagnosis not present

## 2017-01-09 DIAGNOSIS — Z6826 Body mass index (BMI) 26.0-26.9, adult: Secondary | ICD-10-CM | POA: Diagnosis not present

## 2017-01-09 DIAGNOSIS — Z9484 Stem cells transplant status: Secondary | ICD-10-CM | POA: Diagnosis not present

## 2017-01-09 DIAGNOSIS — C9001 Multiple myeloma in remission: Secondary | ICD-10-CM | POA: Diagnosis not present

## 2017-01-09 DIAGNOSIS — D649 Anemia, unspecified: Secondary | ICD-10-CM | POA: Diagnosis not present

## 2017-01-09 DIAGNOSIS — Z9221 Personal history of antineoplastic chemotherapy: Secondary | ICD-10-CM | POA: Diagnosis not present

## 2017-01-09 DIAGNOSIS — E7800 Pure hypercholesterolemia, unspecified: Secondary | ICD-10-CM | POA: Diagnosis not present

## 2017-01-21 ENCOUNTER — Other Ambulatory Visit (INDEPENDENT_AMBULATORY_CARE_PROVIDER_SITE_OTHER): Payer: Medicare Other

## 2017-01-21 DIAGNOSIS — E7800 Pure hypercholesterolemia, unspecified: Secondary | ICD-10-CM | POA: Diagnosis not present

## 2017-01-21 DIAGNOSIS — C9001 Multiple myeloma in remission: Secondary | ICD-10-CM | POA: Diagnosis not present

## 2017-01-21 DIAGNOSIS — E0789 Other specified disorders of thyroid: Secondary | ICD-10-CM | POA: Diagnosis not present

## 2017-01-21 LAB — CBC WITH DIFFERENTIAL/PLATELET
BASOS ABS: 0.1 10*3/uL (ref 0.0–0.1)
Basophils Relative: 1 % (ref 0.0–3.0)
Eosinophils Absolute: 0.4 10*3/uL (ref 0.0–0.7)
Eosinophils Relative: 5.3 % — ABNORMAL HIGH (ref 0.0–5.0)
HCT: 34.4 % — ABNORMAL LOW (ref 36.0–46.0)
Hemoglobin: 11.3 g/dL — ABNORMAL LOW (ref 12.0–15.0)
LYMPHS ABS: 2.4 10*3/uL (ref 0.7–4.0)
Lymphocytes Relative: 29.9 % (ref 12.0–46.0)
MCHC: 32.9 g/dL (ref 30.0–36.0)
MCV: 92 fl (ref 78.0–100.0)
Monocytes Absolute: 1.1 10*3/uL — ABNORMAL HIGH (ref 0.1–1.0)
Monocytes Relative: 14.1 % — ABNORMAL HIGH (ref 3.0–12.0)
NEUTROS ABS: 4 10*3/uL (ref 1.4–7.7)
NEUTROS PCT: 49.7 % (ref 43.0–77.0)
PLATELETS: 256 10*3/uL (ref 150.0–400.0)
RBC: 3.74 Mil/uL — ABNORMAL LOW (ref 3.87–5.11)
RDW: 15.6 % — AB (ref 11.5–15.5)
WBC: 8.1 10*3/uL (ref 4.0–10.5)

## 2017-01-21 LAB — BASIC METABOLIC PANEL
BUN: 12 mg/dL (ref 6–23)
CHLORIDE: 104 meq/L (ref 96–112)
CO2: 22 meq/L (ref 19–32)
Calcium: 9.5 mg/dL (ref 8.4–10.5)
Creatinine, Ser: 0.66 mg/dL (ref 0.40–1.20)
GFR: 95.28 mL/min (ref >60.00)
Glucose, Bld: 122 mg/dL — ABNORMAL HIGH (ref 70–99)
POTASSIUM: 3.5 meq/L (ref 3.5–5.1)
Sodium: 136 mEq/L (ref 135–145)

## 2017-01-21 LAB — TSH: TSH: 1.51 u[IU]/mL (ref 0.35–4.50)

## 2017-01-21 LAB — HEPATIC FUNCTION PANEL
ALK PHOS: 52 U/L (ref 39–117)
ALT: 8 U/L (ref 0–35)
AST: 14 U/L (ref 0–37)
Albumin: 3.9 g/dL (ref 3.5–5.2)
Bilirubin, Direct: 0.1 mg/dL (ref 0.0–0.3)
TOTAL PROTEIN: 6.5 g/dL (ref 6.0–8.3)
Total Bilirubin: 0.5 mg/dL (ref 0.2–1.2)

## 2017-01-21 LAB — LIPID PANEL
CHOL/HDL RATIO: 7
Cholesterol: 224 mg/dL — ABNORMAL HIGH (ref 0–200)
HDL: 32.9 mg/dL — AB (ref >39.00)
LDL CALC: 168 mg/dL — AB (ref 0–99)
NonHDL: 190.67
TRIGLYCERIDES: 112 mg/dL (ref 0.0–149.0)
VLDL: 22.4 mg/dL (ref 0.0–40.0)

## 2017-01-22 ENCOUNTER — Other Ambulatory Visit (INDEPENDENT_AMBULATORY_CARE_PROVIDER_SITE_OTHER): Payer: Medicare Other

## 2017-01-22 ENCOUNTER — Other Ambulatory Visit: Payer: Self-pay | Admitting: Internal Medicine

## 2017-01-22 DIAGNOSIS — D649 Anemia, unspecified: Secondary | ICD-10-CM

## 2017-01-22 LAB — IBC PANEL
Iron: 20 ug/dL — ABNORMAL LOW (ref 42–145)
SATURATION RATIOS: 8.3 % — AB (ref 20.0–50.0)
TRANSFERRIN: 172 mg/dL — AB (ref 212.0–360.0)

## 2017-01-22 LAB — FERRITIN: FERRITIN: 252.4 ng/mL (ref 10.0–291.0)

## 2017-01-22 NOTE — Progress Notes (Signed)
Orders placed for add on labs.  

## 2017-01-23 ENCOUNTER — Other Ambulatory Visit: Payer: Self-pay

## 2017-01-23 ENCOUNTER — Encounter: Payer: Self-pay | Admitting: Internal Medicine

## 2017-01-23 ENCOUNTER — Ambulatory Visit (INDEPENDENT_AMBULATORY_CARE_PROVIDER_SITE_OTHER): Payer: Medicare Other | Admitting: Internal Medicine

## 2017-01-23 DIAGNOSIS — R109 Unspecified abdominal pain: Secondary | ICD-10-CM | POA: Diagnosis not present

## 2017-01-23 DIAGNOSIS — E7800 Pure hypercholesterolemia, unspecified: Secondary | ICD-10-CM

## 2017-01-23 DIAGNOSIS — D649 Anemia, unspecified: Secondary | ICD-10-CM | POA: Diagnosis not present

## 2017-01-23 DIAGNOSIS — C9001 Multiple myeloma in remission: Secondary | ICD-10-CM | POA: Diagnosis not present

## 2017-01-23 MED ORDER — ROSUVASTATIN CALCIUM 5 MG PO TABS: 5.0000 mg | ORAL_TABLET | Freq: Every day | ORAL | 0 refills | Status: DC

## 2017-01-23 NOTE — Progress Notes (Signed)
Patient ID: April Chapman, female   DOB: 12-08-1950, 66 y.o.   MRN: [id number]   Subjective:    Patient ID: April Chapman, female    DOB: 12-15-1950, 66 y.o.   MRN: [id number]  HPI  Patient here for a scheduled follow up.  S/p transplant initially in 2012 with recent relapse.  She subsequently underwent a second autograft on 11/06/16.  She has done well with the transplant.  She is having some pain after eating.  Abdominal discomfort.  No indigestion. No diarrhea.  She is concerned regarding her gallbladder.  Seeing oncology.  Discussed her lab results.  Discussed elevated cholesterol.     Past Medical History:  Diagnosis Date  . Chronic kidney disease H/O   stones  . History of chicken pox   . Multiple myeloma (HCC)    s/p stem cell transplant (08/2010)   Past Surgical History:  Procedure Laterality Date  . APPENDECTOMY  03/2015  . NO PAST SURGERIES     Family History  Problem Relation Age of Onset  . Mental illness Mother   . Hypercholesterolemia Sister   . Colon cancer Neg Hx   . Breast cancer Neg Hx    Social History   Socioeconomic History  . Marital status: Widowed    Spouse name: None  . Number of children: 2  . Years of education: None  . Highest education level: None  Social Needs  . Financial resource strain: None  . Food insecurity - worry: None  . Food insecurity - inability: None  . Transportation needs - medical: None  . Transportation needs - non-medical: None  Occupational History  . Occupation: retired    Fish farm manager: [employer]  Tobacco Use  . Smoking status: Never Smoker  . Smokeless tobacco: Never Used  Substance and Sexual Activity  . Alcohol use: No  . Drug use: No  . Sexual activity: No  Other Topics Concern  . None  Social History Narrative  . None    Outpatient Encounter Medications as of 01/23/2017  Medication Sig  . rosuvastatin (CRESTOR) 5 MG tablet Take 1 tablet (5 mg total) daily by mouth.  . [DISCONTINUED] flurbiprofen  (OCUFEN) 0.03 % ophthalmic solution 1 drop once. Do not use while wearing contact lenses  . [DISCONTINUED] levofloxacin (QUIXIN) 0.5 % ophthalmic solution 1 drop every 4 (four) hours.  . [DISCONTINUED] prednisoLONE acetate (PRED FORTE) 1 % ophthalmic suspension Administer 1 drop into the left eye Four (4) times a day.  . [DISCONTINUED] rosuvastatin (CRESTOR) 5 MG tablet Take 1 tablet (5 mg total) by mouth daily.  . [DISCONTINUED] rosuvastatin (CRESTOR) 5 MG tablet Take 1 tablet (5 mg total) daily by mouth.   No facility-administered encounter medications on file as of 01/23/2017.     Review of Systems  Constitutional: Negative for unexpected weight change.  Respiratory: Negative for cough, chest tightness and shortness of breath.   Cardiovascular: Negative for chest pain and palpitations.  Gastrointestinal: Positive for abdominal pain and diarrhea. Negative for nausea and vomiting.  Genitourinary: Negative for difficulty urinating and dysuria.  Musculoskeletal: Negative for joint swelling.  Skin: Negative for color change and rash.  Neurological: Negative for dizziness, light-headedness and headaches.  Psychiatric/Behavioral: Positive for agitation. Negative for dysphoric mood.       Objective:    Physical Exam  Constitutional: She appears well-developed and well-nourished. No distress.  HENT:  Nose: Nose normal.  Mouth/Throat: Oropharynx is clear and moist.  Neck: Neck supple. No thyromegaly present.  Cardiovascular: Normal rate and regular rhythm.  Pulmonary/Chest: Breath sounds normal. No respiratory distress. She has no wheezes.  Abdominal: Soft. Bowel sounds are normal. There is no tenderness.  Musculoskeletal: She exhibits no edema or tenderness.  Lymphadenopathy:    She has no cervical adenopathy.  Skin: No rash noted. No erythema.  Psychiatric: She has a normal mood and affect. Her behavior is normal.  Some increased stress related to her current medical issues.       BP 110/82 (BP Location: Left Arm, Patient Position: Sitting, Cuff Size: Normal)   Pulse 94   Temp 98.3 F (36.8 C) (Oral)   Resp 18   Ht 5' 8.11" (1.73 m)   Wt 163 lb 6.4 oz (74.1 kg)   SpO2 96%   BMI 24.76 kg/m  Wt Readings from Last 3 Encounters:  01/23/17 163 lb 6.4 oz (74.1 kg)  07/02/16 190 lb 3.2 oz (86.3 kg)  06/04/16 189 lb (85.7 kg)     Lab Results  Component Value Date   WBC 8.1 01/21/2017   HGB 11.3 (L) 01/21/2017   HCT 34.4 (L) 01/21/2017   PLT 256.0 01/21/2017   GLUCOSE 122 (H) 01/21/2017   CHOL 224 (H) 01/21/2017   TRIG 112.0 01/21/2017   HDL 32.90 (L) 01/21/2017   LDLCALC 168 (H) 01/21/2017   ALT 8 01/21/2017   AST 14 01/21/2017   NA 136 01/21/2017   K 3.5 01/21/2017   CL 104 01/21/2017   CREATININE 0.66 01/21/2017   BUN 12 01/21/2017   CO2 22 01/21/2017   TSH 1.51 01/21/2017   INR 1.0 04/01/2014    US Thyroid  Result Date: 07/11/2016 CLINICAL DATA:  Thyroid fullness. EXAM: THYROID ULTRASOUND TECHNIQUE: Ultrasound examination of the thyroid gland and adjacent soft tissues was performed. COMPARISON:  None. FINDINGS: Parenchymal Echotexture: Mildly heterogenous Isthmus: 0.6 cm in the AP dimension Right lobe: 4.6 x 1.9 x 2.0 cm Left lobe: 5.0 x 1.5 x 1.2 cm _________________________________________________________ Estimated total number of nodules >/= 1 cm: 1 Number of spongiform nodules >/=  2 cm not described below (TR1): 0 Number of mixed cystic and solid nodules >/= 1.5 cm not described below ([place]): 0 _________________________________________________________ Nodule # 1: Location: Right; Inferior Maximum size: 2.5 cm; Other 2 dimensions: 1.7 x 1.8 cm Composition: mixed cystic and solid (1) Echogenicity: hypoechoic (2) Shape: not taller-than-wide (0) Margins: smooth (0) Echogenic foci: none (0) ACR TI-RADS total points: 3. ACR TI-RADS risk category: TR3 (3 points). ACR TI-RADS recommendations: **Given size (>/= 2.5 cm) and appearance, fine needle  aspiration of this mildly suspicious nodule should be considered based on TI-RADS criteria. _________________________________________________________ No discrete nodules in left thyroid lobe. IMPRESSION: 2.5 cm complex nodule in the inferior right thyroid lobe. This nodule does meet criteria for biopsy. The above is in keeping with the ACR TI-RADS recommendations - J Am Coll Radiol 2017;14:587-595. Electronically Signed   By: Markus Daft M.D.   On: 07/11/2016 12:24       Assessment & Plan:   Problem List Items Addressed This Visit    Abdominal pain    Symptoms as outlined.  Discomfort.  Discussed further evaluation and w/up.  She plans to discuss with her oncologist.  Desires no further w/up at this time.        Anemia    Slightly decreased hgb recently.  Is s/p transplant.  Followed by oncology.        Hypercholesterolemia    Follow lipid panel and liver function tests.  Elevated recently.  Discussed labs.  She is she has been off her cholesterol medication.  Plans to restart.        Relevant Medications   rosuvastatin (CRESTOR) 5 MG tablet   Multiple myeloma ([place])    She is now s/p her second transplant as outlined.  She is eating.  Has lost weight.  Having some issues with eating.            Einar Pheasant, MD

## 2017-01-26 ENCOUNTER — Encounter: Payer: Self-pay | Admitting: Internal Medicine

## 2017-01-26 DIAGNOSIS — R109 Unspecified abdominal pain: Secondary | ICD-10-CM | POA: Insufficient documentation

## 2017-01-26 DIAGNOSIS — D649 Anemia, unspecified: Secondary | ICD-10-CM | POA: Insufficient documentation

## 2017-01-26 NOTE — Assessment & Plan Note (Addendum)
Follow lipid panel and liver function tests.  Elevated recently.  Discussed labs.  She is she has been off her cholesterol medication.  Plans to restart.

## 2017-01-26 NOTE — Assessment & Plan Note (Signed)
Slightly decreased hgb recently.  Is s/p transplant.  Followed by oncology.

## 2017-01-26 NOTE — Assessment & Plan Note (Signed)
Symptoms as outlined.  Discomfort.  Discussed further evaluation and w/up.  She plans to discuss with her oncologist.  Desires no further w/up at this time.

## 2017-01-26 NOTE — Assessment & Plan Note (Signed)
She is now s/p her second transplant as outlined.  She is eating.  Has lost weight.  Having some issues with eating.

## 2017-01-31 DIAGNOSIS — Z6825 Body mass index (BMI) 25.0-25.9, adult: Secondary | ICD-10-CM | POA: Diagnosis not present

## 2017-01-31 DIAGNOSIS — Z9221 Personal history of antineoplastic chemotherapy: Secondary | ICD-10-CM | POA: Diagnosis not present

## 2017-01-31 DIAGNOSIS — Z9484 Stem cells transplant status: Secondary | ICD-10-CM | POA: Diagnosis not present

## 2017-01-31 DIAGNOSIS — C9001 Multiple myeloma in remission: Secondary | ICD-10-CM | POA: Diagnosis not present

## 2017-01-31 DIAGNOSIS — Z809 Family history of malignant neoplasm, unspecified: Secondary | ICD-10-CM | POA: Diagnosis not present

## 2017-01-31 DIAGNOSIS — K861 Other chronic pancreatitis: Secondary | ICD-10-CM | POA: Diagnosis not present

## 2017-01-31 DIAGNOSIS — Z831 Family history of other infectious and parasitic diseases: Secondary | ICD-10-CM | POA: Diagnosis not present

## 2017-01-31 DIAGNOSIS — Z86718 Personal history of other venous thrombosis and embolism: Secondary | ICD-10-CM | POA: Diagnosis not present

## 2017-01-31 DIAGNOSIS — R21 Rash and other nonspecific skin eruption: Secondary | ICD-10-CM | POA: Diagnosis not present

## 2017-02-10 DIAGNOSIS — C9000 Multiple myeloma not having achieved remission: Secondary | ICD-10-CM | POA: Diagnosis not present

## 2017-02-10 DIAGNOSIS — Z452 Encounter for adjustment and management of vascular access device: Secondary | ICD-10-CM | POA: Diagnosis not present

## 2017-02-26 DIAGNOSIS — Z6825 Body mass index (BMI) 25.0-25.9, adult: Secondary | ICD-10-CM | POA: Diagnosis not present

## 2017-02-26 DIAGNOSIS — C9000 Multiple myeloma not having achieved remission: Secondary | ICD-10-CM | POA: Diagnosis not present

## 2017-02-26 DIAGNOSIS — Z9484 Stem cells transplant status: Secondary | ICD-10-CM | POA: Diagnosis not present

## 2017-02-26 DIAGNOSIS — E041 Nontoxic single thyroid nodule: Secondary | ICD-10-CM | POA: Diagnosis not present

## 2017-03-05 DIAGNOSIS — M1990 Unspecified osteoarthritis, unspecified site: Secondary | ICD-10-CM | POA: Diagnosis not present

## 2017-03-05 DIAGNOSIS — C9001 Multiple myeloma in remission: Secondary | ICD-10-CM | POA: Diagnosis not present

## 2017-03-05 DIAGNOSIS — L658 Other specified nonscarring hair loss: Secondary | ICD-10-CM | POA: Diagnosis not present

## 2017-03-05 DIAGNOSIS — M79644 Pain in right finger(s): Secondary | ICD-10-CM | POA: Diagnosis not present

## 2017-03-05 DIAGNOSIS — M25641 Stiffness of right hand, not elsewhere classified: Secondary | ICD-10-CM | POA: Diagnosis not present

## 2017-03-05 DIAGNOSIS — Z6825 Body mass index (BMI) 25.0-25.9, adult: Secondary | ICD-10-CM | POA: Diagnosis not present

## 2017-03-05 DIAGNOSIS — T451X5S Adverse effect of antineoplastic and immunosuppressive drugs, sequela: Secondary | ICD-10-CM | POA: Diagnosis not present

## 2017-03-05 DIAGNOSIS — R21 Rash and other nonspecific skin eruption: Secondary | ICD-10-CM | POA: Diagnosis not present

## 2017-04-30 ENCOUNTER — Ambulatory Visit: Payer: Medicare Other

## 2017-05-15 DIAGNOSIS — Z6826 Body mass index (BMI) 26.0-26.9, adult: Secondary | ICD-10-CM | POA: Diagnosis not present

## 2017-05-15 DIAGNOSIS — E7800 Pure hypercholesterolemia, unspecified: Secondary | ICD-10-CM | POA: Diagnosis not present

## 2017-05-15 DIAGNOSIS — Z9484 Stem cells transplant status: Secondary | ICD-10-CM | POA: Diagnosis not present

## 2017-05-15 DIAGNOSIS — Z79899 Other long term (current) drug therapy: Secondary | ICD-10-CM | POA: Diagnosis not present

## 2017-05-15 DIAGNOSIS — D808 Other immunodeficiencies with predominantly antibody defects: Secondary | ICD-10-CM | POA: Diagnosis not present

## 2017-05-15 DIAGNOSIS — C9000 Multiple myeloma not having achieved remission: Secondary | ICD-10-CM | POA: Diagnosis not present

## 2017-05-15 DIAGNOSIS — C9001 Multiple myeloma in remission: Secondary | ICD-10-CM | POA: Diagnosis not present

## 2017-05-15 DIAGNOSIS — Z23 Encounter for immunization: Secondary | ICD-10-CM | POA: Diagnosis not present

## 2017-05-20 DIAGNOSIS — C9001 Multiple myeloma in remission: Secondary | ICD-10-CM | POA: Diagnosis not present

## 2017-05-20 DIAGNOSIS — Z9484 Stem cells transplant status: Secondary | ICD-10-CM | POA: Diagnosis not present

## 2017-06-19 DIAGNOSIS — C9000 Multiple myeloma not having achieved remission: Secondary | ICD-10-CM | POA: Diagnosis not present

## 2017-06-19 DIAGNOSIS — Z9484 Stem cells transplant status: Secondary | ICD-10-CM | POA: Diagnosis not present

## 2017-06-19 DIAGNOSIS — C9001 Multiple myeloma in remission: Secondary | ICD-10-CM | POA: Diagnosis not present

## 2017-06-19 DIAGNOSIS — Z6825 Body mass index (BMI) 25.0-25.9, adult: Secondary | ICD-10-CM | POA: Diagnosis not present

## 2017-07-02 ENCOUNTER — Ambulatory Visit (INDEPENDENT_AMBULATORY_CARE_PROVIDER_SITE_OTHER): Payer: Medicare Other

## 2017-07-02 VITALS — BP 110/70 | HR 60 | Temp 98.1°F | Resp 14 | Ht 67.2 in | Wt 161.1 lb

## 2017-07-02 DIAGNOSIS — Z0000 Encounter for general adult medical examination without abnormal findings: Secondary | ICD-10-CM

## 2017-07-02 NOTE — Patient Instructions (Addendum)
  April Chapman , Thank you for taking time to come for your Medicare Wellness Visit. I appreciate your ongoing commitment to your health goals. Please review the following plan we discussed and let me know if I can assist you in the future.   Follow up as needed.   Bring a copy of your [facility] and/or Living Will to be scanned into chart.  Have a great day!  These are the goals we discussed: Goals    . DIET - INCREASE WATER INTAKE     Stay hydrated       This is a list of the screening recommended for you and due dates:  Health Maintenance  Topic Date Due  . Tetanus Vaccine  03/14/1970  . DEXA scan (bone density measurement)  03/14/2016  . Mammogram  07/16/2019*  . Flu Shot  10/16/2017  . Cologuard (Stool DNA test)  05/21/2019  . Pneumonia vaccines (2 of 2 - PPSV23) 03/22/2020  .  Hepatitis C: One time screening is recommended by Center for Disease Control  (CDC) for  adults born from [birth year] through 1965.   Completed  *Topic was postponed. The date shown is not the original due date.

## 2017-07-02 NOTE — Progress Notes (Signed)
Subjective:   April Chapman is a 67 y.o. female who presents for an Initial Medicare Annual Wellness Visit.  Review of Systems    No ROS.  Medicare Wellness Visit. Additional risk factors are reflected in the social history.  Cardiac Risk Factors include: advanced age (>49mn, >>10women)     Objective:    Today's Vitals   07/02/17 1419  BP: 110/70  Pulse: 60  Resp: 14  Temp: 98.1 F (36.7 C)  TempSrc: Oral  SpO2: 98%  Weight: 161 lb 1.9 oz (73.1 kg)  Height: 5' 7.2" (1.707 m)   Body mass index is 25.08 kg/m.  Advanced Directives 07/02/2017 11/13/2015  Does Patient Have a Medical Advance Directive? Yes Yes  Type of [place] Living [place];Healthcare Power of Attorney  Does patient want to make changes to medical advance directive? No - Patient declined -  Copy of [place]mertownin Chart? No - copy requested -    Current Medications (verified) Outpatient Encounter Medications as of 07/02/2017  Medication Sig  . rosuvastatin (CRESTOR) 5 MG tablet Take 1 tablet (5 mg total) daily by mouth.   No facility-administered encounter medications on file as of 07/02/2017.     Allergies (verified) Revlimid [lenalidomide]   History: Past Medical History:  Diagnosis Date  . Chronic kidney disease H/O   stones  . History of chicken pox   . Multiple myeloma (HCC)    s/p stem cell transplant (08/2010)   Past Surgical History:  Procedure Laterality Date  . APPENDECTOMY  03/2015  . NO PAST SURGERIES     Family History  Problem Relation Age of Onset  . Mental illness Mother   . Hypercholesterolemia Sister   . Colon cancer Neg Hx   . Breast cancer Neg Hx    Social History   Socioeconomic History  . Marital status: Widowed    Spouse name: Not on file  . Number of children: 2  . Years of education: Not on file  . Highest education level: Not on file  Occupational History  . 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 Financial resource strain: Not hard at all  . Food insecurity:    Worry: Never true    Inability: Never true  . Transportation needs:    Medical: No    Non-medical: No  Tobacco Use  . Smoking status: Never Smoker  . Smokeless tobacco: Never Used  Substance and Sexual Activity  . Alcohol use: No  . Drug use: No  . Sexual activity: Never  Lifestyle  . Physical activity:    Days per week: Not on file    Minutes per session: Not on file  . Stress: Not on file  Relationships  . Social connections:    Talks on phone: Not on file    Gets together: Not on file    Attends religious service: Not on file    Active member of club or organization: Not on file    Attends meetings of clubs or organizations: Not on file    Relationship status: Not on file  Other Topics Concern  . Not on file  Social History Narrative  . Not on file    Tobacco Counseling Counseling given: Not Answered   Clinical Intake:  Pre-visit preparation completed: Yes  Pain : No/denies pain     Nutritional Status: BMI of 19-24  Normal Diabetes: No  How often do you need  to have someone help you when you read instructions, pamphlets, or other written materials from your doctor or pharmacy?: 1 - Never  Interpreter Needed?: No      Activities of Daily Living In your present state of health, do you have any difficulty performing the following activities: 07/02/2017  Hearing? N  Vision? N  Difficulty concentrating or making decisions? N  Walking or climbing stairs? N  Dressing or bathing? N  Doing errands, shopping? N  Preparing Food and eating ? N  Using the Toilet? N  In the past six months, have you accidently leaked urine? N  Do you have problems with loss of bowel control? N  Managing your Medications? N  Managing your Finances? N  Housekeeping or managing your Housekeeping? N  Some recent data might be hidden     Immunizations and Health  Maintenance Immunization History  Administered Date(s) Administered  . DTaP 05/29/2011, 08/15/2011, 12/06/2011, 02/20/2012  . Hepatitis B, adult 05/29/2011, 08/15/2011, 12/06/2011  . IPV 05/29/2011, 08/15/2011, 12/06/2011  . Influenza,inj,Quad PF,6+ Mos 03/23/2015  . MMR 03/23/2015  . Pneumococcal Conjugate-13 05/29/2011, 08/15/2011, 12/06/2011, 02/20/2012  . Pneumococcal Polysaccharide-23 03/23/2015   Health Maintenance Due  Topic Date Due  . TETANUS/TDAP  03/14/1970  . DEXA SCAN  03/14/2016    Patient Care Team: Einar Pheasant, MD as PCP - General (Internal Medicine)  Indicate any recent Medical Services you may have received from other than [facility] providers in the past year (date may be approximate).     Assessment:   This is a routine wellness examination for April Chapman.  The goal of the wellness visit is to assist the patient how to close the gaps in care and create a preventative care plan for the patient.   The roster of all physicians providing medical care to patient is listed in the Snapshot section of the chart.  Osteoporosis risk reviewed.    Safety issues reviewed; Smoke and carbon monoxide detectors in the home. No firearms in the home. Wears seatbelts when driving or riding with others. No violence in the home.  They do not have excessive sun exposure.  Discussed the need for sun protection: hats, long sleeves and the use of sunscreen if there is significant sun exposure.  Patient is alert, normal appearance, oriented to person/place/and time.  Correctly identified the president of the [place] and recalls of 3/3 words. Performs simple calculations and can read correct time from watch face. Displays appropriate judgement.  No new identified risk were noted.  No failures at ADL's or IADL's.    BMI- discussed the importance of a healthy diet, water intake and the benefits of aerobic exercise. Educational material provided.   24 hour diet recall: Regular  diet  Dental- dentures.    Eye- Visual acuity not assessed per patient preference since they have regular follow up with the ophthalmologist.  Wears corrective lenses.  Sleep patterns- Sleeps through the night without issues.    Patient Concerns: None at this time. Follow up with PCP as needed.  Hearing/Vision screen Hearing Screening Comments: Patient is able to hear conversational tones without difficulty.  No issues reported.  Vision Screening Comments: Followed by [facility] Wears reading glasses Cataract extraction, L eye only Visual acuity not assessed per patient preference since they have regular follow up with the ophthalmologist  Dietary issues and exercise activities discussed: Current Exercise Habits: Home exercise routine, Time (Minutes): 20, Frequency (Times/Week): 4, Weekly Exercise (Minutes/Week): 80  Goals    . DIET -  INCREASE WATER INTAKE     Stay hydrated      Depression Screen PHQ 2/9 Scores 07/02/2017 06/04/2016 11/13/2015  PHQ - 2 Score 0 0 0    Fall Risk Fall Risk  07/02/2017 06/04/2016 11/13/2015  Falls in the past year? No No Yes  Number falls in past yr: - - 1  Injury with Fall? - - No    Cognitive Function:      Screening Tests Health Maintenance  Topic Date Due  . TETANUS/TDAP  03/14/1970  . DEXA SCAN  03/14/2016  . MAMMOGRAM  07/16/2019 (Originally 03/14/2001)  . INFLUENZA VACCINE  10/16/2017  . Fecal DNA (Cologuard)  05/21/2019  . PNA vac Low Risk Adult (2 of 2 - PPSV23) 03/22/2020  . Hepatitis C Screening  Completed     Plan:    End of life planning; Advance aging; Advanced directives discussed. Copy of current HCPOA/Living Will requested.    I have personally reviewed and noted the following in the patient's chart:   . Medical and social history . Use of alcohol, tobacco or illicit drugs  . Current medications and supplements . Functional ability and status . Nutritional status . Physical activity . Advanced  directives . List of other physicians . Hospitalizations, surgeries, and ER visits in previous 12 months . Vitals . Screenings to include cognitive, depression, and falls . Referrals and appointments  In addition, I have reviewed and discussed with patient certain preventive protocols, quality metrics, and best practice recommendations. A written personalized care plan for preventive services as well as general preventive health recommendations were provided to patient.     Varney Biles, LPN   1/64/2903

## 2017-07-02 NOTE — Progress Notes (Signed)
I have reviewed the above note and agree.  Koraima Albertsen, M.D.  

## 2017-07-17 DIAGNOSIS — Z9484 Stem cells transplant status: Secondary | ICD-10-CM | POA: Diagnosis not present

## 2017-07-17 DIAGNOSIS — C9001 Multiple myeloma in remission: Secondary | ICD-10-CM | POA: Diagnosis not present

## 2017-08-14 DIAGNOSIS — Z6825 Body mass index (BMI) 25.0-25.9, adult: Secondary | ICD-10-CM | POA: Diagnosis not present

## 2017-08-14 DIAGNOSIS — C9001 Multiple myeloma in remission: Secondary | ICD-10-CM | POA: Diagnosis not present

## 2017-08-14 DIAGNOSIS — Z9484 Stem cells transplant status: Secondary | ICD-10-CM | POA: Diagnosis not present

## 2017-08-27 DIAGNOSIS — E221 Hyperprolactinemia: Secondary | ICD-10-CM | POA: Diagnosis not present

## 2017-08-27 DIAGNOSIS — Z79899 Other long term (current) drug therapy: Secondary | ICD-10-CM | POA: Diagnosis not present

## 2017-08-27 DIAGNOSIS — E041 Nontoxic single thyroid nodule: Secondary | ICD-10-CM | POA: Diagnosis not present

## 2017-08-27 DIAGNOSIS — Z6824 Body mass index (BMI) 24.0-24.9, adult: Secondary | ICD-10-CM | POA: Diagnosis not present

## 2017-08-27 DIAGNOSIS — C9001 Multiple myeloma in remission: Secondary | ICD-10-CM | POA: Diagnosis not present

## 2017-09-17 ENCOUNTER — Ambulatory Visit (INDEPENDENT_AMBULATORY_CARE_PROVIDER_SITE_OTHER): Payer: Medicare Other | Admitting: Internal Medicine

## 2017-09-17 ENCOUNTER — Encounter: Payer: Self-pay | Admitting: Internal Medicine

## 2017-09-17 VITALS — BP 114/62 | HR 51 | Temp 98.1°F | Resp 18 | Wt 158.8 lb

## 2017-09-17 DIAGNOSIS — E7800 Pure hypercholesterolemia, unspecified: Secondary | ICD-10-CM

## 2017-09-17 DIAGNOSIS — C9001 Multiple myeloma in remission: Secondary | ICD-10-CM | POA: Diagnosis not present

## 2017-09-17 DIAGNOSIS — D649 Anemia, unspecified: Secondary | ICD-10-CM

## 2017-09-17 DIAGNOSIS — E0789 Other specified disorders of thyroid: Secondary | ICD-10-CM | POA: Diagnosis not present

## 2017-09-17 LAB — LIPID PANEL
CHOL/HDL RATIO: 3
CHOLESTEROL: 154 mg/dL (ref 0–200)
HDL: 55.3 mg/dL (ref >39.00)
LDL Cholesterol: 87 mg/dL (ref 0–99)
NonHDL: 98.9
TRIGLYCERIDES: 58 mg/dL (ref 0.0–149.0)
VLDL: 11.6 mg/dL (ref 0.0–40.0)

## 2017-09-17 MED ORDER — ROSUVASTATIN CALCIUM 5 MG PO TABS: 5.0000 mg | ORAL_TABLET | Freq: Every day | ORAL | 1 refills | Status: DC

## 2017-09-17 NOTE — Progress Notes (Signed)
Patient ID: April Chapman, female   DOB: 04/29/1950, 67 y.o.   MRN: [id number]   Subjective:    Patient ID: April Chapman, female    DOB: 21-Jun-1950, 67 y.o.   MRN: [id number]  HPI  Patient here for a scheduled follow up.  She is followed by Dr Katina Dung for multiple myeloma.  She is s/p her second autologous transplant.  She is doing well.  Felt things were stable on last check - 08/14/17.  Recommended f/u in 4 months.  She is also followed by endocrinology for hyperprolactinemia on dostinex therapy.  Also followed for thyroid nodule. Clinically euthyroid.  Felt stable.  Recommended f/u in 6 months.  She reports she is doing well.  Is volunteering at [facility].  Doing well. Feels good.  No chest pain.  No sob. No acid reflux.  No abdominal pain.  Bowels moving.  Since her treatment, her taste has changed some.  She does not eat as many sweets.     Past Medical History:  Diagnosis Date  . Chronic kidney disease H/O   stones  . History of chicken pox   . Multiple myeloma (HCC)    s/p stem cell transplant (08/2010)   Past Surgical History:  Procedure Laterality Date  . APPENDECTOMY  03/2015  . NO PAST SURGERIES     Family History  Problem Relation Age of Onset  . Mental illness Mother   . Hypercholesterolemia Sister   . Colon cancer Neg Hx   . Breast cancer Neg Hx    Social History   Socioeconomic History  . Marital status: Widowed    Spouse name: Not on file  . Number of children: 2  . Years of education: Not on file  . Highest education level: Not on file  Occupational History  . Occupation: retired    Fish farm manager: [employer]  Social Needs  . Financial resource strain: Not hard at all  . Food insecurity:    Worry: Never true    Inability: Never true  . Transportation needs:    Medical: No    Non-medical: No  Tobacco Use  . Smoking status: Never Smoker  . Smokeless tobacco: Never Used  Substance and Sexual Activity  . Alcohol use: No  . Drug use: No  . Sexual  activity: Never  Lifestyle  . Physical activity:    Days per week: Not on file    Minutes per session: Not on file  . Stress: Not on file  Relationships  . Social connections:    Talks on phone: Not on file    Gets together: Not on file    Attends religious service: Not on file    Active member of club or organization: Not on file    Attends meetings of clubs or organizations: Not on file    Relationship status: Not on file  Other Topics Concern  . Not on file  Social History Narrative  . Not on file    Outpatient Encounter Medications as of 09/17/2017  Medication Sig  . POMALYST 3 MG capsule TAKE 1 CAPSULE BY MOUTH DAILY FOR 21 DAYS ON FOLLOWED BY 7 DAYS OFF.  . rosuvastatin (CRESTOR) 5 MG tablet Take 1 tablet (5 mg total) by mouth daily.  . [DISCONTINUED] rosuvastatin (CRESTOR) 5 MG tablet Take 1 tablet (5 mg total) daily by mouth.   No facility-administered encounter medications on file as of 09/17/2017.     Review of Systems  Constitutional: Negative for appetite change  and unexpected weight change.  HENT: Negative for congestion and sinus pressure.   Respiratory: Negative for cough, chest tightness and shortness of breath.   Cardiovascular: Negative for chest pain, palpitations and leg swelling.  Gastrointestinal: Negative for abdominal pain, diarrhea, nausea and vomiting.  Genitourinary: Negative for difficulty urinating and dysuria.  Musculoskeletal: Negative for joint swelling and myalgias.  Skin: Negative for color change and rash.  Neurological: Negative for dizziness, light-headedness and headaches.  Psychiatric/Behavioral: Negative for agitation and dysphoric mood.       Objective:    Physical Exam  Constitutional: She appears well-developed and well-nourished. No distress.  HENT:  Nose: Nose normal.  Mouth/Throat: Oropharynx is clear and moist.  Neck: Neck supple. No thyromegaly present.  Cardiovascular: Normal rate and regular rhythm.  Pulmonary/Chest:  Breath sounds normal. No respiratory distress. She has no wheezes.  Abdominal: Soft. Bowel sounds are normal. There is no tenderness.  Musculoskeletal: She exhibits no edema or tenderness.  Lymphadenopathy:    She has no cervical adenopathy.  Skin: No rash noted. No erythema.  Psychiatric: She has a normal mood and affect. Her behavior is normal.    BP 114/62 (BP Location: Left Arm, Patient Position: Sitting, Cuff Size: Normal)   Pulse (!) 51   Temp 98.1 F (36.7 C) (Oral)   Resp 18   Wt 158 lb 12.8 oz (72 kg)   SpO2 98%   BMI 24.72 kg/m  Wt Readings from Last 3 Encounters:  09/17/17 158 lb 12.8 oz (72 kg)  07/02/17 161 lb 1.9 oz (73.1 kg)  01/23/17 163 lb 6.4 oz (74.1 kg)     Lab Results  Component Value Date   WBC 8.1 01/21/2017   HGB 11.3 (L) 01/21/2017   HCT 34.4 (L) 01/21/2017   PLT 256.0 01/21/2017   GLUCOSE 122 (H) 01/21/2017   CHOL 154 09/17/2017   TRIG 58.0 09/17/2017   HDL 55.30 09/17/2017   LDLCALC 87 09/17/2017   ALT 8 01/21/2017   AST 14 01/21/2017   NA 136 01/21/2017   K 3.5 01/21/2017   CL 104 01/21/2017   CREATININE 0.66 01/21/2017   BUN 12 01/21/2017   CO2 22 01/21/2017   TSH 1.51 01/21/2017   INR 1.0 04/01/2014    US Thyroid  Result Date: 07/11/2016 CLINICAL DATA:  Thyroid fullness. EXAM: THYROID ULTRASOUND TECHNIQUE: Ultrasound examination of the thyroid gland and adjacent soft tissues was performed. COMPARISON:  None. FINDINGS: Parenchymal Echotexture: Mildly heterogenous Isthmus: 0.6 cm in the AP dimension Right lobe: 4.6 x 1.9 x 2.0 cm Left lobe: 5.0 x 1.5 x 1.2 cm _________________________________________________________ Estimated total number of nodules >/= 1 cm: 1 Number of spongiform nodules >/=  2 cm not described below (TR1): 0 Number of mixed cystic and solid nodules >/= 1.5 cm not described below ([place]): 0 _________________________________________________________ Nodule # 1: Location: Right; Inferior Maximum size: 2.5 cm; Other 2  dimensions: 1.7 x 1.8 cm Composition: mixed cystic and solid (1) Echogenicity: hypoechoic (2) Shape: not taller-than-wide (0) Margins: smooth (0) Echogenic foci: none (0) ACR TI-RADS total points: 3. ACR TI-RADS risk category: TR3 (3 points). ACR TI-RADS recommendations: **Given size (>/= 2.5 cm) and appearance, fine needle aspiration of this mildly suspicious nodule should be considered based on TI-RADS criteria. _________________________________________________________ No discrete nodules in left thyroid lobe. IMPRESSION: 2.5 cm complex nodule in the inferior right thyroid lobe. This nodule does meet criteria for biopsy. The above is in keeping with the ACR TI-RADS recommendations - J Am Coll Radiol  [id number]. Electronically Signed   By: Markus Daft M.D.   On: 07/11/2016 12:24       Assessment & Plan:   Problem List Items Addressed This Visit    Anemia    S/p transplant.  Followed by oncology.        Hypercholesterolemia - Primary    On crestor.  Low cholesterol diet and exercise.  Recent liver panel wnl.  Check lipid panel today.        Relevant Medications   rosuvastatin (CRESTOR) 5 MG tablet   Other Relevant Orders   Lipid panel (Completed)   Multiple myeloma in remission [facility])    S/p second transplant.  Just saw oncology.  Felt stable.  Recommended f/u in 4 months.        Relevant Medications   POMALYST 3 MG capsule   Thyroid fullness    Found to have thyroid nodule.  Being followed by endocrinology. Stable.  Recommended f/u in 6 months.            Einar Pheasant, MD

## 2017-09-19 ENCOUNTER — Encounter: Payer: Self-pay | Admitting: Internal Medicine

## 2017-09-21 ENCOUNTER — Encounter: Payer: Self-pay | Admitting: Internal Medicine

## 2017-09-21 NOTE — Assessment & Plan Note (Signed)
Found to have thyroid nodule.  Being followed by endocrinology. Stable.  Recommended f/u in 6 months.

## 2017-09-21 NOTE — Assessment & Plan Note (Signed)
S/p transplant.  Followed by oncology.

## 2017-09-21 NOTE — Assessment & Plan Note (Signed)
On crestor.  Low cholesterol diet and exercise.  Recent liver panel wnl.  Check lipid panel today.

## 2017-09-21 NOTE — Assessment & Plan Note (Signed)
S/p second transplant.  Just saw oncology.  Felt stable.  Recommended f/u in 4 months.

## 2017-10-07 DIAGNOSIS — C9001 Multiple myeloma in remission: Secondary | ICD-10-CM | POA: Diagnosis not present

## 2017-11-06 DIAGNOSIS — D808 Other immunodeficiencies with predominantly antibody defects: Secondary | ICD-10-CM | POA: Diagnosis not present

## 2017-11-06 DIAGNOSIS — Z23 Encounter for immunization: Secondary | ICD-10-CM | POA: Diagnosis not present

## 2017-11-06 DIAGNOSIS — E7800 Pure hypercholesterolemia, unspecified: Secondary | ICD-10-CM | POA: Diagnosis not present

## 2017-11-06 DIAGNOSIS — Z9481 Bone marrow transplant status: Secondary | ICD-10-CM | POA: Diagnosis not present

## 2017-11-06 DIAGNOSIS — C9001 Multiple myeloma in remission: Secondary | ICD-10-CM | POA: Diagnosis not present

## 2017-11-06 DIAGNOSIS — Z6825 Body mass index (BMI) 25.0-25.9, adult: Secondary | ICD-10-CM | POA: Diagnosis not present

## 2017-12-08 DIAGNOSIS — C9001 Multiple myeloma in remission: Secondary | ICD-10-CM | POA: Diagnosis not present

## 2018-01-01 DIAGNOSIS — C9001 Multiple myeloma in remission: Secondary | ICD-10-CM | POA: Diagnosis not present

## 2018-01-01 DIAGNOSIS — Z6825 Body mass index (BMI) 25.0-25.9, adult: Secondary | ICD-10-CM | POA: Diagnosis not present

## 2018-02-05 DIAGNOSIS — Z961 Presence of intraocular lens: Secondary | ICD-10-CM | POA: Diagnosis not present

## 2018-02-16 DIAGNOSIS — C9001 Multiple myeloma in remission: Secondary | ICD-10-CM | POA: Diagnosis not present

## 2018-03-04 DIAGNOSIS — Z6825 Body mass index (BMI) 25.0-25.9, adult: Secondary | ICD-10-CM | POA: Diagnosis not present

## 2018-03-04 DIAGNOSIS — E041 Nontoxic single thyroid nodule: Secondary | ICD-10-CM | POA: Diagnosis not present

## 2018-03-04 DIAGNOSIS — M810 Age-related osteoporosis without current pathological fracture: Secondary | ICD-10-CM | POA: Diagnosis not present

## 2018-03-19 ENCOUNTER — Ambulatory Visit
Admission: EM | Admit: 2018-03-19 | Discharge: 2018-03-19 | Disposition: A | Discharge status: Home / Self Care | Payer: Medicare Other | Attending: Family Medicine | Admitting: Family Medicine

## 2018-03-19 ENCOUNTER — Other Ambulatory Visit: Payer: Self-pay

## 2018-03-19 ENCOUNTER — Encounter: Payer: Self-pay | Admitting: Gynecology

## 2018-03-19 DIAGNOSIS — R5383 Other fatigue: Secondary | ICD-10-CM | POA: Insufficient documentation

## 2018-03-19 DIAGNOSIS — E860 Dehydration: Secondary | ICD-10-CM | POA: Insufficient documentation

## 2018-03-19 NOTE — ED Triage Notes (Signed)
Per patient x 5 days just not feeling well. Per patient has multiple myleoma and believe the medication weaken her immune system

## 2018-03-19 NOTE — Discharge Instructions (Signed)
Please drink more fluids to keep your urine the color of water or very pale yellow.  Eat foods to help with your weakness.  If you are not improving or worsen go to the emergency room or your primary care physician.

## 2018-03-19 NOTE — ED Provider Notes (Signed)
[facility]    CSN: [id number] Arrival date & time: 03/19/18  2536     History   Chief Complaint No chief complaint on file.   HPI April Chapman is a 68 y.o. female.   HPI  68 year old female presents stating that for 5 days she has not felt well.  Does not offer any specific symptoms.  People have told her she has a cold but she disagrees.  Does exhibit a runny nose but she states this is mainly from crying because she is so frustrated.  Is had no fever but does subscribe to having occasional chills.  Stated that this all started with an episode of diarrhea on Sunday.  She states that she is felt so badly she has not eaten anything for the last 2 days.  Appears depressed and does cry during the examination.  Respiratory symptoms sinus symptoms has had no nausea vomiting and only one episode of diarrhea as described above.  She has no vaginal discharge.  She is not running fever.          Past Medical History:  Diagnosis Date  . Chronic kidney disease H/O   stones  . History of chicken pox   . Multiple myeloma (HCC)    s/p stem cell transplant (08/2010)    Patient Active Problem List   Diagnosis Date Noted  . Abdominal pain 01/26/2017  . Anemia 01/26/2017  . Tachycardia 04/21/2016  . Thyroid fullness 04/15/2016  . Hypercholesterolemia 04/15/2016  . Health care maintenance 11/14/2015  . Multiple myeloma in remission ([place]) 03/23/2015  . Toenail fungus 10/24/2013  . Numbness of toes 10/22/2013  . Shoulder pain, left 04/27/2013  . Elbow pain, right 04/27/2013  . Heel pain 04/27/2013  . Cat scratch 04/27/2013  . Leg skin lesion, left 04/27/2013  . Multiple myeloma ([place]) 01/04/2013    Past Surgical History:  Procedure Laterality Date  . APPENDECTOMY  03/2015  . NO PAST SURGERIES      OB History    Gravida  2   Para  2   Term      Preterm      AB      Living        SAB      TAB      Ectopic      Multiple      Live Births              Home Medications    Prior to Admission medications   Medication Sig Start Date End Date Taking? Authorizing Provider  POMALYST 3 MG capsule TAKE 1 CAPSULE BY MOUTH DAILY FOR 21 DAYS ON FOLLOWED BY 7 DAYS OFF. 09/09/17  Yes [provider]  rosuvastatin (CRESTOR) 5 MG tablet Take 1 tablet (5 mg total) by mouth daily. 09/17/17  Yes Einar Pheasant, MD    Family History Family History  Problem Relation Age of Onset  . Mental illness Mother   . Hypercholesterolemia Sister   . Colon cancer Neg Hx   . Breast cancer Neg Hx     Social History Social History   Tobacco Use  . Smoking status: Never Smoker  . Smokeless tobacco: Never Used  Substance Use Topics  . Alcohol use: No  . Drug use: No     Allergies   Revlimid [lenalidomide]   Review of Systems Review of Systems  Constitutional: Positive for activity change, appetite change, chills and fatigue. Negative for fever.  HENT: Positive for rhinorrhea.  Negative for congestion, ear pain, facial swelling, sinus pressure, sinus pain and sore throat.   Respiratory: Negative for cough.   All other systems reviewed and are negative.    Physical Exam Triage Vital Signs ED Triage Vitals  Enc Vitals Group     BP 03/19/18 0938 (!) 112/54     Pulse Rate 03/19/18 0938 68     Resp 03/19/18 0938 16     Temp 03/19/18 0938 98.7 F (37.1 C)     Temp Source 03/19/18 0938 Oral     SpO2 03/19/18 0938 100 %     Weight 03/19/18 0936 162 lb (73.5 kg)     Height 03/19/18 0936 '5\' 7"'  (1.702 m)     Head Circumference --      Peak Flow --      Pain Score 03/19/18 0936 5     Pain Loc --      Pain Edu? --      Excl. in [place]? --    No data found.  Updated Vital Signs BP (!) 112/54 (BP Location: Left Arm)   Pulse 68   Temp 98.7 F (37.1 C) (Oral)   Resp 16   Ht '5\' 7"'  (1.702 m)   Wt 162 lb (73.5 kg)   SpO2 100%   BMI 25.37 kg/m   Visual Acuity Right Eye Distance:   Left Eye Distance:   Bilateral Distance:     Right Eye Near:   Left Eye Near:    Bilateral Near:     Physical Exam Vitals signs and nursing note reviewed.  Constitutional:      General: She is not in acute distress.    Appearance: Normal appearance. She is normal weight. She is not ill-appearing, toxic-appearing or diaphoretic.  HENT:     Head: Normocephalic.     Right Ear: Tympanic membrane and ear canal normal.     Left Ear: Tympanic membrane and ear canal normal.     Nose: Nose normal. No congestion or rhinorrhea.     Mouth/Throat:     Mouth: Mucous membranes are moist.     Pharynx: No oropharyngeal exudate or posterior oropharyngeal erythema.  Eyes:     General:        Right eye: No discharge.        Left eye: No discharge.     Conjunctiva/sclera: Conjunctivae normal.     Pupils: Pupils are equal, round, and reactive to light.  Neck:     Musculoskeletal: Normal range of motion.  Pulmonary:     Effort: Pulmonary effort is normal.     Breath sounds: Normal breath sounds.  Abdominal:     General: Abdomen is flat.  Musculoskeletal: Normal range of motion.  Lymphadenopathy:     Cervical: No cervical adenopathy.  Skin:    General: Skin is warm and dry.  Neurological:     General: No focal deficit present.     Mental Status: She is alert and oriented to person, place, and time.  Psychiatric:        Mood and Affect: Mood normal.        Behavior: Behavior normal.        Thought Content: Thought content normal.        Judgment: Judgment normal.      UC Treatments / Results  Labs (all labs ordered are listed, but only abnormal results are displayed) Labs Reviewed - No data to display  EKG None  Radiology No results found.  Procedures  Procedures (including critical care time)  Medications Ordered in UC Medications - No data to display  Initial Impression / Assessment and Plan / UC Course  I have reviewed the triage vital signs and the nursing notes.  Pertinent labs & imaging results that were  available during my care of the patient were reviewed by me and considered in my medical decision making (see chart for details).   I have explained to the patient that the examination today is negative for any specific problem other than the turgor of her skin and the history that she has not been drinking or eating properly.  I feel that this may be the source of her fatigue and not feeling well.  It does not seem that anything has manifest specifically  accounting for her fatigue and she may have an element of depression.  I have advised her to watch the trends of her illness.  She needs to drink more fluid in order to keep her urine clear or a very pale yellow.  I have encouraged her to start eating again.  If she is not improving or worsen she should follow-up with her primary care physician or the emergency department   Final Clinical Impressions(s) / UC Diagnoses   Final diagnoses:  Fatigue, unspecified type  Dehydration     Discharge Instructions     Please drink more fluids to keep your urine the color of water or very pale yellow.  Eat foods to help with your weakness.  If you are not improving or worsen go to the emergency room or your primary care physician.   ED Prescriptions    None     Controlled Substance Prescriptions [place] Controlled Substance Registry consulted? Not Applicable   Lorin Picket, PA-C 03/19/18 1103

## 2018-05-21 DIAGNOSIS — C9001 Multiple myeloma in remission: Secondary | ICD-10-CM | POA: Diagnosis not present

## 2018-05-27 DIAGNOSIS — C9001 Multiple myeloma in remission: Secondary | ICD-10-CM | POA: Diagnosis not present

## 2018-06-09 DIAGNOSIS — G5762 Lesion of plantar nerve, left lower limb: Secondary | ICD-10-CM | POA: Diagnosis not present

## 2018-06-09 DIAGNOSIS — M79672 Pain in left foot: Secondary | ICD-10-CM | POA: Diagnosis not present

## 2018-07-02 DIAGNOSIS — C9001 Multiple myeloma in remission: Secondary | ICD-10-CM | POA: Diagnosis not present

## 2018-07-03 ENCOUNTER — Encounter: Payer: Medicare Other | Admitting: Internal Medicine

## 2018-07-06 ENCOUNTER — Ambulatory Visit: Payer: Medicare Other

## 2018-09-03 ENCOUNTER — Ambulatory Visit: Payer: Medicare Other

## 2018-09-17 DIAGNOSIS — C9001 Multiple myeloma in remission: Secondary | ICD-10-CM | POA: Diagnosis not present

## 2018-09-23 ENCOUNTER — Ambulatory Visit (INDEPENDENT_AMBULATORY_CARE_PROVIDER_SITE_OTHER): Payer: Medicare Other

## 2018-09-23 ENCOUNTER — Other Ambulatory Visit: Payer: Self-pay

## 2018-09-23 DIAGNOSIS — Z0000 Encounter for general adult medical examination without abnormal findings: Secondary | ICD-10-CM

## 2018-09-23 NOTE — Progress Notes (Signed)
Subjective:   April Chapman is a 68 y.o. female who presents for Medicare Annual (Subsequent) preventive examination.  Review of Systems:  No ROS.  Medicare Wellness Virtual Visit.  Visual/audio telehealth visit, UTA vital signs.   See social history for additional risk factors.   Cardiac Risk Factors include: advanced age (>45mn, >>66women)     Objective:     Vitals: There were no vitals taken for this visit.  There is no height or weight on file to calculate BMI.  Advanced Directives 09/23/2018 03/19/2018 07/02/2017 11/13/2015  Does Patient Have a Medical Advance Directive? Yes No Yes Yes  Type of [place]amed[place]of [place] will - [place] AgencyLiving will Living will;Healthcare Power of Attorney  Does patient want to make changes to medical advance directive? No - Patient declined - No - Patient declined -  Copy of [place]? No - copy requested - No - copy requested -    Tobacco Social History   Tobacco Use  Smoking Status Never Smoker  Smokeless Tobacco Never Used     Counseling given: Not Answered   Clinical Intake:  Pre-visit preparation completed: Yes        Diabetes: No  How often do you need to have someone help you when you read instructions, pamphlets, or other written materials from your doctor or pharmacy?: 1 - Never  Interpreter Needed?: No     Past Medical History:  Diagnosis Date  . Chronic kidney disease H/O   stones  . History of chicken pox   . Multiple myeloma (HCC)    s/p stem cell transplant (08/2010)   Past Surgical History:  Procedure Laterality Date  . APPENDECTOMY  03/2015  . NO PAST SURGERIES     Family History  Problem Relation Age of Onset  . Mental illness Mother   . Hypercholesterolemia Sister   . Colon cancer Neg Hx   . Breast cancer Neg Hx    Social History   Socioeconomic History  . Marital status: Widowed    Spouse name: Not on file  .  Number of children: 2  . Years of education: Not on file  . Highest education level: Not on file  Occupational History  . 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 Financial resource strain: Not hard at all  . Food insecurity    Worry: Never true    Inability: Never true  . Transportation needs    Medical: No    Non-medical: No  Tobacco Use  . Smoking status: Never Smoker  . Smokeless tobacco: Never Used  Substance and Sexual Activity  . Alcohol use: No  . Drug use: No  . Sexual activity: Never  Lifestyle  . Physical activity    Days per week: 4 days    Minutes per session: 20 min  . Stress: Not at all  Relationships  . Social [place] phone: Not on file    Gets together: Not on file    Attends religious service: Not on file    Active member of club or organization: Not on file    Attends meetings of clubs or organizations: Not on file    Relationship status: Not on file  Other Topics Concern  . Not on file  Social History Narrative  . Not on file    Outpatient Encounter Medications as of 09/23/2018  Medication Sig  .  POMALYST 3 MG capsule TAKE 1 CAPSULE BY MOUTH DAILY FOR 21 DAYS ON FOLLOWED BY 7 DAYS OFF.  . rosuvastatin (CRESTOR) 5 MG tablet Take 1 tablet (5 mg total) by mouth daily. (Patient not taking: Reported on 09/23/2018)   No facility-administered encounter medications on file as of 09/23/2018.     Activities of Daily Living In your present state of health, do you have any difficulty performing the following activities: 09/23/2018  Hearing? N  Vision? N  Difficulty concentrating or making decisions? N  Walking or climbing stairs? N  Dressing or bathing? N  Doing errands, shopping? N  Preparing Food and eating ? N  Using the Toilet? N  In the past six months, have you accidently leaked urine? N  Do you have problems with loss of bowel control? N  Managing your Medications? N  Managing your Finances? N  Housekeeping or  managing your Housekeeping? N  Some recent data might be hidden    Patient Care Team: Einar Pheasant, MD as PCP - General (Internal Medicine)    Assessment:   This is a routine wellness examination for April Chapman.  I connected with patient 09/23/18 at 10:30 AM EDT by a video/audio enabled telemedicine application and verified that I am speaking with the correct person using two identifiers. Patient stated full name and DOB. Patient gave permission to continue with virtual visit. Patient's location was at home and Nurse's location was at [place] office.   Cpe scheduled with pcp.  Health Screenings  Cologuard- 05/2016 Glaucoma -none Bone Density- [facility] -02/2018 Hearing -demonstrates normal hearing during visit. Hemoglobin A1C - 03/2014 Cholesterol - 09/2017  Dental- dentures Vision- visits within the last 12 months.  Social  Alcohol intake - no       Smoking history- never    Smokers in home? none Illicit drug use? none Physical activity- Yard work, shoveling, balance exercises Diet - Regular Sexually Active -never BMI- discussed the importance of a healthy diet, water intake and the benefits of aerobic exercise.  Educational material provided.   Safety  Patient feels safe at home- yes Patient does have smoke detectors at home- yes Patient does wear sunscreen or protective clothing when in direct sunlight -yes Patient does wear seat belt when in a moving vehicle -yes Patient drives- yes  [id number] precautions and sickness symptoms discussed.   Activities of Daily Living Patient denies needing assistance with: driving, household chores, feeding themselves, getting from bed to chair, getting to the toilet, bathing/showering, dressing, managing money, or preparing meals.  No new identified risk were noted.    Depression Screen Patient denies losing interest in daily life, feeling hopeless, or crying easily over simple problems.   Medication-taking as directed and without issues.    Fall Screen Patient denies being afraid of falling or falling in the last year.   Memory Screen Patient is alert.  Patient denies difficulty focusing, concentrating or misplacing items. Correctly identified the president of the [place], season and recall.  Immunizations The following Immunizations were discussed: Influenza, shingles, pneumonia, and tetanus.   Other Providers Patient Care Team: Einar Pheasant, MD as PCP - General (Internal Medicine)  Exercise Activities and Dietary recommendations Current Exercise Habits: Home exercise routine  Goals    . DIET - INCREASE WATER INTAKE     Stay hydrated       Fall Risk Fall Risk  09/23/2018 07/02/2017 06/04/2016 11/13/2015  Falls in the past year? 0 No No Yes  Number falls in past yr: - - -  1  Injury with Fall? - - - No   Depression Screen PHQ 2/9 Scores 09/23/2018 07/02/2017 06/04/2016 11/13/2015  PHQ - 2 Score 0 0 0 0     Cognitive Function     6CIT Screen 09/23/2018  What Year? 0 points  What month? 0 points  What time? 0 points  Count back from 20 0 points  Months in reverse 0 points  Repeat phrase 0 points  Total Score 0    Immunization History  Administered Date(s) Administered  . DTaP 05/29/2011, 08/15/2011, 12/06/2011, 02/20/2012  . DTaP / Hep B / IPV 05/15/2017  . Hepatitis B, adult 05/29/2011, 08/15/2011, 12/06/2011  . HiB (PRP-T) 05/15/2017  . IPV 05/29/2011, 08/15/2011, 12/06/2011  . Influenza,inj,Quad PF,6+ Mos 03/23/2015  . MMR 03/23/2015  . Pneumococcal Conjugate-13 05/29/2011, 08/15/2011, 12/06/2011, 02/20/2012, 05/15/2017  . Pneumococcal Polysaccharide-23 03/23/2015  . Zoster Recombinat (Shingrix) 05/15/2017   Screening Tests Health Maintenance  Topic Date Due  . TETANUS/TDAP  03/14/1970  . DEXA SCAN  03/14/2016  . MAMMOGRAM  07/16/2019 (Originally 03/14/2001)  . INFLUENZA VACCINE  10/17/2018  . Fecal DNA (Cologuard)  05/21/2019  . PNA vac Low Risk Adult (2 of 2 - PPSV23) 03/22/2020  . Hepatitis  C Screening  Completed      Plan:    End of life planning; Advance aging; Advanced directives discussed.  Copy of current HCPOA/Living Will requested.    I have personally reviewed and noted the following in the patient's chart:   . Medical and social history . Use of alcohol, tobacco or illicit drugs  . Current medications and supplements . Functional ability and status . Nutritional status . Physical activity . Advanced directives . List of other physicians . Hospitalizations, surgeries, and ER visits in previous 12 months . Vitals . Screenings to include cognitive, depression, and falls . Referrals and appointments  In addition, I have reviewed and discussed with patient certain preventive protocols, quality metrics, and best practice recommendations. A written personalized care plan for preventive services as well as general preventive health recommendations were provided to patient.     Varney Biles, LPN  0/05/1592   Reviewed above information.  Agree with assessment and plan.    Dr Nicki Reaper

## 2018-09-23 NOTE — Patient Instructions (Addendum)
  April Chapman , Thank you for taking time to come for your Medicare Wellness Visit. I appreciate your ongoing commitment to your health goals. Please review the following plan we discussed and let me know if I can assist you in the future.   These are the goals we discussed: Goals      Patient Stated   . Follow up with Primary Care Provider (pt-stated)     Cpe Eye exam        This is a list of the screening recommended for you and due dates:  Health Maintenance  Topic Date Due  . Tetanus Vaccine  03/14/1970  . DEXA scan (bone density measurement)  03/14/2016  . Mammogram  07/16/2019*  . Flu Shot  10/17/2018  . Cologuard (Stool DNA test)  05/21/2019  . Pneumonia vaccines (2 of 2 - PPSV23) 03/22/2020  .  Hepatitis C: One time screening is recommended by Center for Disease Control  (CDC) for  adults born from [birth year] through 1965.   Completed  *Topic was postponed. The date shown is not the original due date.

## 2018-09-29 ENCOUNTER — Ambulatory Visit: Payer: Medicare Other

## 2018-10-01 DIAGNOSIS — G629 Polyneuropathy, unspecified: Secondary | ICD-10-CM | POA: Diagnosis not present

## 2018-10-01 DIAGNOSIS — C9001 Multiple myeloma in remission: Secondary | ICD-10-CM | POA: Diagnosis not present

## 2018-10-01 DIAGNOSIS — T451X5A Adverse effect of antineoplastic and immunosuppressive drugs, initial encounter: Secondary | ICD-10-CM | POA: Diagnosis not present

## 2018-10-01 DIAGNOSIS — E7800 Pure hypercholesterolemia, unspecified: Secondary | ICD-10-CM | POA: Diagnosis not present

## 2018-10-01 DIAGNOSIS — Z23 Encounter for immunization: Secondary | ICD-10-CM | POA: Diagnosis not present

## 2018-10-01 DIAGNOSIS — Z6825 Body mass index (BMI) 25.0-25.9, adult: Secondary | ICD-10-CM | POA: Diagnosis not present

## 2018-10-01 DIAGNOSIS — D701 Agranulocytosis secondary to cancer chemotherapy: Secondary | ICD-10-CM | POA: Diagnosis not present

## 2018-10-01 DIAGNOSIS — G620 Drug-induced polyneuropathy: Secondary | ICD-10-CM | POA: Diagnosis not present

## 2018-10-01 DIAGNOSIS — Z9481 Bone marrow transplant status: Secondary | ICD-10-CM | POA: Diagnosis not present

## 2018-10-06 ENCOUNTER — Encounter: Payer: Self-pay | Admitting: Internal Medicine

## 2018-10-07 ENCOUNTER — Other Ambulatory Visit: Payer: Self-pay

## 2018-10-07 MED ORDER — ROSUVASTATIN CALCIUM 5 MG PO TABS: 5.0000 mg | ORAL_TABLET | Freq: Every day | ORAL | 0 refills | Status: DC

## 2018-10-07 NOTE — Telephone Encounter (Signed)
Pt screened to come in for cpe on friday

## 2018-10-07 NOTE — Telephone Encounter (Signed)
30 day of cholesterol medication sent in

## 2018-10-09 ENCOUNTER — Other Ambulatory Visit: Payer: Self-pay

## 2018-10-09 ENCOUNTER — Encounter: Payer: Self-pay | Admitting: Internal Medicine

## 2018-10-09 ENCOUNTER — Ambulatory Visit (INDEPENDENT_AMBULATORY_CARE_PROVIDER_SITE_OTHER): Payer: Medicare Other | Admitting: Internal Medicine

## 2018-10-09 DIAGNOSIS — H9312 Tinnitus, left ear: Secondary | ICD-10-CM | POA: Diagnosis not present

## 2018-10-09 DIAGNOSIS — E0789 Other specified disorders of thyroid: Secondary | ICD-10-CM | POA: Diagnosis not present

## 2018-10-09 DIAGNOSIS — C9001 Multiple myeloma in remission: Secondary | ICD-10-CM

## 2018-10-09 DIAGNOSIS — Z9882 Breast implant status: Secondary | ICD-10-CM | POA: Diagnosis not present

## 2018-10-09 DIAGNOSIS — Z0000 Encounter for general adult medical examination without abnormal findings: Secondary | ICD-10-CM

## 2018-10-09 DIAGNOSIS — E7800 Pure hypercholesterolemia, unspecified: Secondary | ICD-10-CM | POA: Diagnosis not present

## 2018-10-09 DIAGNOSIS — D649 Anemia, unspecified: Secondary | ICD-10-CM

## 2018-10-09 LAB — LIPID PANEL
Cholesterol: 197 mg/dL (ref 0–200)
HDL: 52 mg/dL (ref >39.00)
LDL Cholesterol: 130 mg/dL — ABNORMAL HIGH (ref 0–99)
NonHDL: 144.67
Total CHOL/HDL Ratio: 4
Triglycerides: 71 mg/dL (ref 0.0–149.0)
VLDL: 14.2 mg/dL (ref 0.0–40.0)

## 2018-10-09 LAB — TSH: TSH: 0.8 u[IU]/mL (ref 0.35–4.50)

## 2018-10-09 NOTE — Assessment & Plan Note (Addendum)
Physical today 10/08/28.  cologuard negative - 05/20/16.  Declines mammogram.

## 2018-10-09 NOTE — Progress Notes (Signed)
Patient ID: April Chapman, female   DOB: [birth year], 68 y.o.   MRN: [id number]   Subjective:    Patient ID: April Chapman, female    DOB: 1950/07/31, 68 y.o.   MRN: [id number]  HPI  Patient here for her physical exam.  She reports she is doing relatively well. No fever.  No congestion or cough.  No sob.  No chest pain.  No acid reflux.  No abdominal pain.  Bowels moving.  Saw oncology 06/2018 for f/u - history of multiple myelomas s/p second autologous transplant.  Maintained on pomalidomide.  Doing well.  Some side effects related to her treatment - hair loss and cramping.  Has not started supplements suggested by Dr Garnette Scheuermann.  Discussed with her today.  She saw Dr Vickki Muff - foot pain.  She desires no further intervention or treatment.  Discussed stretches.  She is concerned regarding her breast implants.  Interested in discussing having removed.  Request referral.  She declines mammogram.  Also reports persistent tinnitus - left ear.  States she can intermittently hear her heart beat in left ear.  Denies loss of hearing.  Discussed ent referral.    Past Medical History:  Diagnosis Date  . Chronic kidney disease H/O   stones  . History of chicken pox   . Multiple myeloma (HCC)    s/p stem cell transplant (08/2010)   Past Surgical History:  Procedure Laterality Date  . APPENDECTOMY  03/2015  . NO PAST SURGERIES     Family History  Problem Relation Age of Onset  . Mental illness Mother   . Hypercholesterolemia Sister   . Colon cancer Neg Hx   . Breast cancer Neg Hx    Social History   Socioeconomic History  . Marital status: Widowed    Spouse name: Not on file  . Number of children: 2  . Years of education: Not on file  . Highest education level: Not on file  Occupational History  . Occupation: retired    Fish farm manager: [employer]  Social Needs  . Financial resource strain: Not hard at all  . Food insecurity    Worry: Never true    Inability: Never true  . Transportation  needs    Medical: No    Non-medical: No  Tobacco Use  . Smoking status: Never Smoker  . Smokeless tobacco: Never Used  Substance and Sexual Activity  . Alcohol use: No  . Drug use: No  . Sexual activity: Never  Lifestyle  . Physical activity    Days per week: 4 days    Minutes per session: 20 min  . Stress: Not at all  Relationships  . Social Herbalist on phone: Not on file    Gets together: Not on file    Attends religious service: Not on file    Active member of club or organization: Not on file    Attends meetings of clubs or organizations: Not on file    Relationship status: Not on file  Other Topics Concern  . Not on file  Social History Narrative  . Not on file    Outpatient Encounter Medications as of 10/09/2018  Medication Sig  . pomalidomide (POMALYST) 2 MG capsule Take 1 capsule by mouth daily on days 1-21 then 7 days rest  . rosuvastatin (CRESTOR) 5 MG tablet Take 1 tablet (5 mg total) by mouth daily.  . [DISCONTINUED] POMALYST 3 MG capsule TAKE 1 CAPSULE BY MOUTH DAILY FOR 21  DAYS ON FOLLOWED BY 7 DAYS OFF.   No facility-administered encounter medications on file as of 10/09/2018.     Review of Systems  Constitutional: Negative for appetite change and unexpected weight change.  HENT: Positive for tinnitus. Negative for congestion and sinus pressure.   Eyes: Negative for pain and visual disturbance.  Respiratory: Negative for cough, chest tightness and shortness of breath.   Cardiovascular: Negative for chest pain, palpitations and leg swelling.  Gastrointestinal: Negative for abdominal pain, diarrhea and nausea.  Genitourinary: Negative for difficulty urinating and dysuria.  Musculoskeletal: Negative for joint swelling and myalgias.       Cramping as outlined.  Better once she is up and moving.  Does not limit her activity.    Skin: Negative for color change and rash.  Neurological: Negative for dizziness, light-headedness and headaches.   Hematological: Negative for adenopathy. Does not bruise/bleed easily.  Psychiatric/Behavioral: Negative for agitation and dysphoric mood.       Objective:    Physical Exam Constitutional:      General: She is not in acute distress.    Appearance: Normal appearance.  HENT:     Right Ear: External ear normal. There is no impacted cerumen.     Left Ear: External ear normal. There is no impacted cerumen.  Eyes:     General: No scleral icterus.       Right eye: No discharge.        Left eye: No discharge.     Conjunctiva/sclera: Conjunctivae normal.  Neck:     Musculoskeletal: Neck supple. No muscular tenderness.     Thyroid: No thyromegaly.  Cardiovascular:     Rate and Rhythm: Normal rate and regular rhythm.  Pulmonary:     Effort: No respiratory distress.     Breath sounds: Normal breath sounds. No wheezing.     Comments: Breasts:  No nipple discharge or nipple retraction present.  Could not appreciate any distinct nodules or axillary adenopathy.  Implants in place.   Abdominal:     General: Bowel sounds are normal.     Palpations: Abdomen is soft.     Tenderness: There is no abdominal tenderness.  Musculoskeletal:        General: No swelling or tenderness.  Lymphadenopathy:     Cervical: No cervical adenopathy.  Skin:    Findings: No erythema or rash.  Neurological:     Mental Status: She is alert.  Psychiatric:        Mood and Affect: Mood normal.        Behavior: Behavior normal.    BP 118/70   Pulse (!) 57   Temp 98.5 F (36.9 C) (Oral)   Resp 16   Ht '5\' 7"'  (1.702 m)   Wt 165 lb (74.8 kg)   SpO2 97%   BMI 25.84 kg/m  Wt Readings from Last 3 Encounters:  10/09/18 165 lb (74.8 kg)  03/19/18 162 lb (73.5 kg)  09/17/17 158 lb 12.8 oz (72 kg)     Lab Results  Component Value Date   WBC 8.1 01/21/2017   HGB 11.3 (L) 01/21/2017   HCT 34.4 (L) 01/21/2017   PLT 256.0 01/21/2017   GLUCOSE 122 (H) 01/21/2017   CHOL 197 10/09/2018   TRIG 71.0 10/09/2018    HDL 52.00 10/09/2018   LDLCALC 130 (H) 10/09/2018   ALT 8 01/21/2017   AST 14 01/21/2017   NA 136 01/21/2017   K 3.5 01/21/2017   CL 104 01/21/2017  CREATININE 0.66 01/21/2017   BUN 12 01/21/2017   CO2 22 01/21/2017   TSH 0.80 10/09/2018   INR 1.0 04/01/2014       Assessment & Plan:   Problem List Items Addressed This Visit    Anemia    S/p autologous transplant.  Followed by oncology.  They are following cbc.       H/O bilateral breast implants    History of breast implants.  Desires to have removed.  Request referral to surgery to discuss.        Relevant Orders   Ambulatory referral to [place] care maintenance    Physical today 10/08/28.  cologuard negative - 05/20/16.  Declines mammogram.       Hypercholesterolemia    On crestor.  Low cholesterol diet and exercise.  Follow lipid panel and liver function tests.        Relevant Orders   TSH (Completed)   Lipid panel (Completed)   Multiple myeloma ([place])    She is now s/p her second autologous transplant.  Doing well.  Followed by oncology.  Side effects of treatment discussed with Dr Garnette Scheuermann.  Has not started supplements.  Discussed with her today.        Relevant Medications   pomalidomide (POMALYST) 2 MG capsule   Thyroid fullness    Thyroid nodule found.  Has seen endocrinology.  Continue f/u with endocrinology.        Tinnitus    Persistent ringing in left ear. Also hears heart beat in ear.  No headache.  No dizziness.  Blood pressure ok.  Refer to ENT.        Relevant Orders   Ambulatory referral to ENT      I spent 40 minutes with the patient.  Time spent discussing her current symptoms and concerns regarding her current treatment and concerns regarding persistent tinnitus, cramping, etc.    Einar Pheasant, MD

## 2018-10-10 ENCOUNTER — Encounter: Payer: Self-pay | Admitting: Internal Medicine

## 2018-10-10 DIAGNOSIS — H9319 Tinnitus, unspecified ear: Secondary | ICD-10-CM | POA: Insufficient documentation

## 2018-10-10 DIAGNOSIS — Z9882 Breast implant status: Secondary | ICD-10-CM | POA: Insufficient documentation

## 2018-10-10 NOTE — Assessment & Plan Note (Signed)
Thyroid nodule found.  Has seen endocrinology.  Continue f/u with endocrinology.

## 2018-10-10 NOTE — Assessment & Plan Note (Signed)
On crestor.  Low cholesterol diet and exercise.  Follow lipid panel and liver function tests.   

## 2018-10-10 NOTE — Assessment & Plan Note (Signed)
S/p autologous transplant.  Followed by oncology.  They are following cbc.

## 2018-10-10 NOTE — Assessment & Plan Note (Signed)
History of breast implants.  Desires to have removed.  Request referral to surgery to discuss.

## 2018-10-10 NOTE — Assessment & Plan Note (Signed)
She is now s/p her second autologous transplant.  Doing well.  Followed by oncology.  Side effects of treatment discussed with Dr Garnette Scheuermann.  Has not started supplements.  Discussed with her today.

## 2018-10-10 NOTE — Assessment & Plan Note (Signed)
Persistent ringing in left ear. Also hears heart beat in ear.  No headache.  No dizziness.  Blood pressure ok.  Refer to ENT.

## 2018-10-12 ENCOUNTER — Other Ambulatory Visit: Payer: Self-pay

## 2018-10-12 MED ORDER — ROSUVASTATIN CALCIUM 5 MG PO TABS: 5.0000 mg | ORAL_TABLET | Freq: Every day | ORAL | 2 refills | Status: DC

## 2018-10-14 ENCOUNTER — Other Ambulatory Visit: Payer: Self-pay | Admitting: Otolaryngology

## 2018-10-14 DIAGNOSIS — H90A22 Sensorineural hearing loss, unilateral, left ear, with restricted hearing on the contralateral side: Secondary | ICD-10-CM | POA: Diagnosis not present

## 2018-10-14 DIAGNOSIS — IMO0001 Reserved for inherently not codable concepts without codable children: Secondary | ICD-10-CM

## 2018-10-14 DIAGNOSIS — H9042 Sensorineural hearing loss, unilateral, left ear, with unrestricted hearing on the contralateral side: Secondary | ICD-10-CM

## 2018-10-14 DIAGNOSIS — H93A9 Pulsatile tinnitus, unspecified ear: Secondary | ICD-10-CM

## 2018-10-30 ENCOUNTER — Ambulatory Visit
Admission: RE | Admit: 2018-10-30 | Discharge: 2018-10-30 | Disposition: A | Discharge status: Home / Self Care | Payer: Medicare Other | Source: Ambulatory Visit | Attending: Otolaryngology | Admitting: Otolaryngology

## 2018-10-30 ENCOUNTER — Other Ambulatory Visit: Payer: Self-pay

## 2018-10-30 DIAGNOSIS — H9042 Sensorineural hearing loss, unilateral, left ear, with unrestricted hearing on the contralateral side: Secondary | ICD-10-CM | POA: Insufficient documentation

## 2018-10-30 DIAGNOSIS — H93A9 Pulsatile tinnitus, unspecified ear: Secondary | ICD-10-CM

## 2018-10-30 DIAGNOSIS — IMO0001 Reserved for inherently not codable concepts without codable children: Secondary | ICD-10-CM

## 2018-10-30 DIAGNOSIS — I6501 Occlusion and stenosis of right vertebral artery: Secondary | ICD-10-CM | POA: Diagnosis not present

## 2018-10-30 LAB — POCT I-STAT CREATININE: Creatinine, Ser: 0.8 mg/dL (ref 0.44–1.00)

## 2018-10-30 MED ORDER — GADOBUTROL 1 MMOL/ML IV SOLN
7.0000 mL | Freq: Once | INTRAVENOUS | Status: AC | PRN
  Administered 2018-10-30: 7 mL via INTRAVENOUS

## 2018-11-09 DIAGNOSIS — L570 Actinic keratosis: Secondary | ICD-10-CM | POA: Diagnosis not present

## 2018-11-09 DIAGNOSIS — L821 Other seborrheic keratosis: Secondary | ICD-10-CM | POA: Diagnosis not present

## 2018-11-19 DIAGNOSIS — H2511 Age-related nuclear cataract, right eye: Secondary | ICD-10-CM | POA: Diagnosis not present

## 2018-12-03 DIAGNOSIS — C9001 Multiple myeloma in remission: Secondary | ICD-10-CM | POA: Diagnosis not present

## 2018-12-03 DIAGNOSIS — Z6826 Body mass index (BMI) 26.0-26.9, adult: Secondary | ICD-10-CM | POA: Diagnosis not present

## 2018-12-03 DIAGNOSIS — G629 Polyneuropathy, unspecified: Secondary | ICD-10-CM | POA: Diagnosis not present

## 2018-12-03 DIAGNOSIS — E039 Hypothyroidism, unspecified: Secondary | ICD-10-CM | POA: Diagnosis not present

## 2018-12-03 DIAGNOSIS — M722 Plantar fascial fibromatosis: Secondary | ICD-10-CM | POA: Diagnosis not present

## 2019-01-11 DIAGNOSIS — H25011 Cortical age-related cataract, right eye: Secondary | ICD-10-CM | POA: Diagnosis not present

## 2019-01-11 DIAGNOSIS — H2511 Age-related nuclear cataract, right eye: Secondary | ICD-10-CM | POA: Diagnosis not present

## 2019-01-11 DIAGNOSIS — H40013 Open angle with borderline findings, low risk, bilateral: Secondary | ICD-10-CM | POA: Diagnosis not present

## 2019-01-11 DIAGNOSIS — H26492 Other secondary cataract, left eye: Secondary | ICD-10-CM | POA: Diagnosis not present

## 2019-01-11 DIAGNOSIS — H52213 Irregular astigmatism, bilateral: Secondary | ICD-10-CM | POA: Diagnosis not present

## 2019-01-13 DIAGNOSIS — C9001 Multiple myeloma in remission: Secondary | ICD-10-CM | POA: Diagnosis not present

## 2019-01-20 DIAGNOSIS — C9001 Multiple myeloma in remission: Secondary | ICD-10-CM | POA: Diagnosis not present

## 2019-01-20 DIAGNOSIS — H2512 Age-related nuclear cataract, left eye: Secondary | ICD-10-CM | POA: Diagnosis not present

## 2019-02-16 DIAGNOSIS — H25811 Combined forms of age-related cataract, right eye: Secondary | ICD-10-CM | POA: Diagnosis not present

## 2019-03-15 ENCOUNTER — Telehealth: Payer: Self-pay | Admitting: Internal Medicine

## 2019-03-15 NOTE — Telephone Encounter (Signed)
Pt is wanting to get a referral to see Dr.Solum at [facility]

## 2019-03-18 NOTE — Telephone Encounter (Signed)
Left message for patient to return call to office. 

## 2019-03-18 NOTE — Telephone Encounter (Signed)
May not need appt depending on reason why she desires referral to Dr Fredna Dow.

## 2019-03-18 NOTE — Telephone Encounter (Signed)
Patient has changed her mind she will stay with Dr. Loney Loh.

## 2019-03-22 DIAGNOSIS — E041 Nontoxic single thyroid nodule: Secondary | ICD-10-CM | POA: Diagnosis not present

## 2019-03-26 DIAGNOSIS — Z9882 Breast implant status: Secondary | ICD-10-CM | POA: Diagnosis not present

## 2019-04-08 DIAGNOSIS — C9101 Acute lymphoblastic leukemia, in remission: Secondary | ICD-10-CM | POA: Diagnosis not present

## 2019-04-08 DIAGNOSIS — Z9481 Bone marrow transplant status: Secondary | ICD-10-CM | POA: Diagnosis not present

## 2019-04-08 DIAGNOSIS — Z9882 Breast implant status: Secondary | ICD-10-CM | POA: Diagnosis not present

## 2019-04-08 DIAGNOSIS — E7800 Pure hypercholesterolemia, unspecified: Secondary | ICD-10-CM | POA: Diagnosis not present

## 2019-04-08 DIAGNOSIS — Z23 Encounter for immunization: Secondary | ICD-10-CM | POA: Diagnosis not present

## 2019-04-08 DIAGNOSIS — C9001 Multiple myeloma in remission: Secondary | ICD-10-CM | POA: Diagnosis not present

## 2019-04-08 DIAGNOSIS — Z79899 Other long term (current) drug therapy: Secondary | ICD-10-CM | POA: Diagnosis not present

## 2019-04-08 DIAGNOSIS — R921 Mammographic calcification found on diagnostic imaging of breast: Secondary | ICD-10-CM | POA: Diagnosis not present

## 2019-04-12 ENCOUNTER — Ambulatory Visit (INDEPENDENT_AMBULATORY_CARE_PROVIDER_SITE_OTHER): Payer: Medicare Other | Admitting: Internal Medicine

## 2019-04-12 ENCOUNTER — Other Ambulatory Visit: Payer: Self-pay

## 2019-04-12 DIAGNOSIS — Z9882 Breast implant status: Secondary | ICD-10-CM | POA: Diagnosis not present

## 2019-04-12 DIAGNOSIS — E7800 Pure hypercholesterolemia, unspecified: Secondary | ICD-10-CM | POA: Diagnosis not present

## 2019-04-12 DIAGNOSIS — E0789 Other specified disorders of thyroid: Secondary | ICD-10-CM

## 2019-04-12 DIAGNOSIS — C9001 Multiple myeloma in remission: Secondary | ICD-10-CM

## 2019-04-12 DIAGNOSIS — H9312 Tinnitus, left ear: Secondary | ICD-10-CM | POA: Diagnosis not present

## 2019-04-12 DIAGNOSIS — D649 Anemia, unspecified: Secondary | ICD-10-CM

## 2019-04-12 DIAGNOSIS — R200 Anesthesia of skin: Secondary | ICD-10-CM | POA: Diagnosis not present

## 2019-04-12 NOTE — Progress Notes (Signed)
Patient ID: April Chapman, female   DOB: [birth year], 69 y.o.   MRN: [id number]   Virtual Visit via video Note  This visit type was conducted due to national recommendations for restrictions regarding the COVID-19 pandemic (e.g. social distancing).  This format is felt to be most appropriate for this patient at this time.  All issues noted in this document were discussed and addressed.  No physical exam was performed (except for noted visual exam findings with Video Visits).   I connected with Franklyn Lor by a video enabled telemedicine application  and verified that I am speaking with the correct person using two identifiers. Location patient: home Location provider: work Persons participating in the virtual visit: patient, provider  The limitations, risks, security and privacy concerns of performing an evaluation and management service by video and the availability of in person appointments have been discussed.  The patient expressed understanding and agreed to proceed.   Reason for visit: scheduled follow up.   HPI: She reports she is doing relatively well.  Just evaluated by Dr Iven Finn 04/08/19 - stable.  Note states myeloma remains in CR.  Saw Dr Stasia Cavalier 03/26/19 for evaluation breat implants.  Had implants placed 1982. He requested mammogram prior to removal.  Plans for removal if ok.  She is also followed by Dr Loney Loh for f/u thyroid nodule.  He planned to check TFTs and thyroglobulin with plans if thyroglobulin increasing, f/u ultrasound.  Continues f/u with Dr Loney Loh.   Also followed by Dr Katina Dung.  Has been on pomalyst.  Some neuropathy.  He discussed starting cymbalta.  She has not started.  Wanted to hold on any further medication.  Stable.  Tries to stay active.  No chest pain or sob reported.  Eating.  No abdominal pain or bowel change reported.  Tinnitus - persistent, but stable.  Handling stress.     ROS: See pertinent positives and negatives per HPI.  Past Medical  History:  Diagnosis Date  . Chronic kidney disease H/O   stones  . History of chicken pox   . Multiple myeloma (HCC)    s/p stem cell transplant (08/2010)    Past Surgical History:  Procedure Laterality Date  . APPENDECTOMY  03/2015  . APPENDECTOMY      Family History  Problem Relation Age of Onset  . Mental illness Mother   . Hypercholesterolemia Sister   . Colon cancer Neg Hx   . Breast cancer Neg Hx     SOCIAL HX: reviewed.    Current Outpatient Medications:  .  pomalidomide (POMALYST) 2 MG capsule, Take 1 capsule by mouth daily on days 1-21 then 7 days rest, Disp: , Rfl:  .  rosuvastatin (CRESTOR) 5 MG tablet, Take 1 tablet (5 mg total) by mouth daily., Disp: 90 tablet, Rfl: 2  EXAM:  GENERAL: alert, oriented, appears well and in no acute distress  HEENT: atraumatic, conjunttiva clear, no obvious abnormalities on inspection of external nose and ears  NECK: normal movements of the head and neck  LUNGS: on inspection no signs of respiratory distress, breathing rate appears normal, no obvious gross SOB, gasping or wheezing  CV: no obvious cyanosis  PSYCH/NEURO: pleasant and cooperative, no obvious depression or anxiety, speech and thought processing grossly intact  ASSESSMENT AND PLAN:  Discussed the following assessment and plan:  Anemia S/p autologous transplant.  Followed by oncology.  They are following cbc.   H/O bilateral breast implants History of breast implants.  Seeing Dr Stasia Cavalier.  He recommended mammogram prior to removal.    Hypercholesterolemia On crestor.  Low cholesterol diet and exercise.  Follow lipid panel and liver function tests.    Multiple myeloma (HCC) S/p second autologous transplant. Doing well.  Followed by oncology.  Sees Dr Garnette Scheuermann.    Numbness of toes With neuropathy.  Dr Lucianne Lei DeVenter had discussed starting cymbalta.  She desires not to start at this time.  Follow.    Thyroid fullness Seeing Dr Loney Loh for f/u thyroid  nodule.  Just had f/u and labs.    Tinnitus Stable.     I discussed the assessment and treatment plan with the patient. The patient was provided an opportunity to ask questions and all were answered. The patient agreed with the plan and demonstrated an understanding of the instructions.   The patient was advised to call back or seek an in-person evaluation if the symptoms worsen or if the condition fails to improve as anticipated.   Einar Pheasant, MD

## 2019-04-17 ENCOUNTER — Encounter: Payer: Self-pay | Admitting: Internal Medicine

## 2019-04-17 NOTE — Assessment & Plan Note (Signed)
On crestor.  Low cholesterol diet and exercise.  Follow lipid panel and liver function tests.   

## 2019-04-17 NOTE — Assessment & Plan Note (Signed)
S/p autologous transplant.  Followed by oncology.  They are following cbc.

## 2019-04-17 NOTE — Assessment & Plan Note (Signed)
Seeing Dr Loney Loh for f/u thyroid nodule.  Just had f/u and labs.

## 2019-04-17 NOTE — Assessment & Plan Note (Signed)
History of breast implants.  Seeing Dr Stasia Cavalier.  He recommended mammogram prior to removal.

## 2019-04-17 NOTE — Assessment & Plan Note (Signed)
Stable

## 2019-04-17 NOTE — Assessment & Plan Note (Signed)
S/p second autologous transplant. Doing well.  Followed by oncology.  Sees Dr Garnette Scheuermann.

## 2019-04-17 NOTE — Assessment & Plan Note (Signed)
With neuropathy.  Dr Lucianne Lei DeVenter had discussed starting cymbalta.  She desires not to start at this time.  Follow.

## 2019-04-22 DIAGNOSIS — Z6826 Body mass index (BMI) 26.0-26.9, adult: Secondary | ICD-10-CM | POA: Diagnosis not present

## 2019-04-22 DIAGNOSIS — C9000 Multiple myeloma not having achieved remission: Secondary | ICD-10-CM | POA: Diagnosis not present

## 2019-04-22 DIAGNOSIS — C9001 Multiple myeloma in remission: Secondary | ICD-10-CM | POA: Diagnosis not present

## 2019-05-31 ENCOUNTER — Telehealth: Payer: Self-pay | Admitting: Internal Medicine

## 2019-05-31 DIAGNOSIS — E041 Nontoxic single thyroid nodule: Secondary | ICD-10-CM

## 2019-05-31 NOTE — Telephone Encounter (Signed)
Pt called in and needs a new Endocrinologist. She said they one she has is retiring and she had discussed with Dr. Nicki Reaper at her last appointment that she might want to change. She would like to see Dr. Gabriel Carina @ [facility] sometime after May when she is back in the state. Will you please send a referral to them?

## 2019-05-31 NOTE — Telephone Encounter (Signed)
Pt last seen 1/25. Would like to be referred to Dr Gabriel Carina since her endocrinologist is retiring

## 2019-06-01 NOTE — Telephone Encounter (Signed)
Order placed for endocrinology referral.  

## 2019-06-01 NOTE — Addendum Note (Signed)
Addended by: Alisa Graff on: 06/01/2019 04:41 AM   Modules accepted: Orders

## 2019-06-01 NOTE — Telephone Encounter (Signed)
I have placed the order for endocrinology referral.  Pt prefers to schedule endocrinology appt for 07/2019.  Thanks.

## 2019-07-13 DIAGNOSIS — E041 Nontoxic single thyroid nodule: Secondary | ICD-10-CM | POA: Diagnosis not present

## 2019-07-26 DIAGNOSIS — G629 Polyneuropathy, unspecified: Secondary | ICD-10-CM | POA: Diagnosis not present

## 2019-07-26 DIAGNOSIS — M810 Age-related osteoporosis without current pathological fracture: Secondary | ICD-10-CM | POA: Diagnosis not present

## 2019-07-26 DIAGNOSIS — C9001 Multiple myeloma in remission: Secondary | ICD-10-CM | POA: Diagnosis not present

## 2019-07-26 DIAGNOSIS — R21 Rash and other nonspecific skin eruption: Secondary | ICD-10-CM | POA: Diagnosis not present

## 2019-07-26 DIAGNOSIS — C9000 Multiple myeloma not having achieved remission: Secondary | ICD-10-CM | POA: Diagnosis not present

## 2019-07-26 DIAGNOSIS — Z6825 Body mass index (BMI) 25.0-25.9, adult: Secondary | ICD-10-CM | POA: Diagnosis not present

## 2019-09-10 ENCOUNTER — Telehealth: Payer: Self-pay

## 2019-09-10 NOTE — Telephone Encounter (Signed)
Left message for patient to return call back. Patient is due for cologuard, need to see if patient is ok to do again or want a colonoscopy.

## 2019-09-24 ENCOUNTER — Ambulatory Visit (INDEPENDENT_AMBULATORY_CARE_PROVIDER_SITE_OTHER): Payer: Medicare Other

## 2019-09-24 VITALS — Ht 67.5 in | Wt 166.0 lb

## 2019-09-24 DIAGNOSIS — Z1211 Encounter for screening for malignant neoplasm of colon: Secondary | ICD-10-CM

## 2019-09-24 DIAGNOSIS — Z0000 Encounter for general adult medical examination without abnormal findings: Secondary | ICD-10-CM | POA: Diagnosis not present

## 2019-09-24 NOTE — Progress Notes (Addendum)
Subjective:   April Chapman is a 69 y.o. female who presents for Medicare Annual (Subsequent) preventive examination.  Review of Systems    No ROS.  Medicare Wellness Virtual Visit.   Cardiac Risk Factors include: advanced age (>8mn, >>78women)     Objective:    Today's Vitals   09/24/19 1039  Weight: 166 lb (75.3 kg)  Height: 5' 7.5" (1.715 m)   Body mass index is 25.62 kg/m.  Advanced Directives 09/24/2019 09/23/2018 03/19/2018 07/02/2017 11/13/2015  Does Patient Have a Medical Advance Directive? Yes Yes No Yes Yes  Type of AParamedicof [place]ure [place] will [place] will - [place] will Living will;Healthcare Power of Attorney  Does patient want to make changes to medical advance directive? No - Patient declined No - Patient declined - No - Patient declined -  Copy of HIrwinin Chart? No - copy requested No - copy requested - No - copy requested -    Current Medications (verified) Outpatient Encounter Medications as of 09/24/2019  Medication Sig   rosuvastatin (CRESTOR) 5 MG tablet Take 1 tablet (5 mg total) by mouth daily. (Patient not taking: Reported on 09/24/2019)   valACYclovir (VALTREX) 500 MG tablet    [DISCONTINUED] pomalidomide (POMALYST) 2 MG capsule Take 1 capsule by mouth daily on days 1-21 then 7 days rest   No facility-administered encounter medications on file as of 09/24/2019.    Allergies (verified) Revlimid [lenalidomide] and Pomalidomide   History: Past Medical History:  Diagnosis Date   Chronic kidney disease H/O   stones   History of chicken pox    Multiple myeloma (HCC)    s/p stem cell transplant (08/2010)   Past Surgical History:  Procedure Laterality Date   APPENDECTOMY  03/2015   APPENDECTOMY     Family History  Problem Relation Age of Onset   Mental illness Mother    Hypercholesterolemia Sister    Colon cancer Neg Hx    Breast  cancer Neg Hx    Social History   Socioeconomic History   Marital status: Widowed    Spouse name: Not on file   Number of children: 2   Years of education: Not on file   Highest education level: Not on file  Occupational History   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[employer]  Tobacco Use   Smoking status: Never Smoker   Smokeless tobacco: Never Used  VScientific laboratory technicianUse: Never used  Substance and Sexual Activity   Alcohol use: No   Drug use: No   Sexual activity: Never  Other Topics Concern   Not on file  Social History Narrative   Not on file   Social Determinants of Health   Financial Resource Strain:    Difficulty of Paying Living Expenses:   Food Insecurity:    Worried About RCharity fundraiserin the Last Year:    RArboriculturistin the Last Year:   Transportation Needs:    LFilm/video editor(Medical):    Lack of Transportation (Non-Medical):   Physical Activity:    Days of Exercise per Week:    Minutes of Exercise per Session:   Stress:    Feeling of Stress :   Social Connections: Unknown   Frequency of Communication with Friends and Family: More than three times a week   Frequency of Social Gatherings with Friends and Family: More than three  times a week   Attends Religious Services: Not on file   Active Member of Clubs or Organizations: Yes   Attends Archivist Meetings: Not on file   Marital Status: Widowed    Tobacco Counseling Counseling given: Not Answered   Clinical Intake:  Pre-visit preparation completed: Yes        Diabetes: No  How often do you need to have someone help you when you read instructions, pamphlets, or other written materials from your doctor or pharmacy?: 1 - Never  Interpreter Needed?: No      Activities of Daily Living In your present state of health, do you have any difficulty performing the following activities: 09/24/2019  Hearing? N  Vision? N  Difficulty  concentrating or making decisions? N  Walking or climbing stairs? N  Dressing or bathing? N  Doing errands, shopping? N  Preparing Food and eating ? N  Using the Toilet? N  In the past six months, have you accidently leaked urine? N  Do you have problems with loss of bowel control? N  Managing your Medications? N  Managing your Finances? N  Housekeeping or managing your Housekeeping? N  Some recent data might be hidden    Patient Care Team: Einar Pheasant, MD as PCP - General (Internal Medicine)  Indicate any recent Medical Services you may have received from other than [facility] providers in the past year (date may be approximate).     Assessment:   This is a routine wellness examination for April Chapman.  I connected with April Chapman today by telephone and verified that I am speaking with the correct person using two identifiers. Location patient: home Location provider: work Persons participating in the virtual visit: patient, Marine scientist.    I discussed the limitations, risks, security and privacy concerns of performing an evaluation and management service by telephone and the availability of in person appointments. The patient expressed understanding and verbally consented to this telephonic visit.    Interactive audio and video telecommunications were attempted between this provider and patient, however failed, due to patient having technical difficulties OR patient did not have access to video capability.  We continued and completed visit with audio only.  Some vital signs may be absent or patient reported.   Hearing/Vision screen  Hearing Screening   125Hz 250Hz 500Hz 1000Hz 2000Hz 3000Hz 4000Hz 6000Hz 8000Hz  Right ear:           Left ear:           Comments: Patient is able to hear conversational tones without difficulty.  No issues reported.   Vision Screening Comments: Followed by Dr. Ellin Mayhew Wears corrective lenses Visual acuity not assessed, virtual visit.  They have seen their  ophthalmologist in the last 12 months.     Dietary issues and exercise activities discussed: Current Exercise Habits: Home exercise routine, Intensity: Mild  Regular diet Good water intake  Goals      Patient Stated     Follow up with Primary Care Provider (pt-stated)      As needed      Depression Screen PHQ 2/9 Scores 09/24/2019 04/12/2019 09/23/2018 07/02/2017 06/04/2016 11/13/2015  PHQ - 2 Score 0 0 0 0 0 0    Fall Risk Fall Risk  09/24/2019 04/12/2019 09/23/2018 07/02/2017 06/04/2016  Falls in the past year? 0 0 0 No No  Number falls in past yr: 0 - - - -  Injury with Fall? - - - - -  Follow up Falls evaluation  completed - - - -   Handrails in use when climbing stairs? Yes  Home free of loose throw rugs in walkways, pet beds, electrical cords, etc? Yes  Adequate lighting in your home to reduce risk of falls? Yes   ASSISTIVE DEVICES UTILIZED TO PREVENT FALLS: Use of a cane, walker or w/c? No  Grab bars in the bathroom? No  Shower chair or bench in shower? No  Elevated toilet seat or a handicapped toilet? No   TIMED UP AND GO:  Was the test performed? No . Virtual visit.  Cognitive Function: Patient is alert and oriented x3.  Denies difficulty focusing, concentrating, making decisions, memory loss.   MMSE - Mini Mental State Exam 09/24/2019  Not completed: Unable to complete     6CIT Screen 09/23/2018  What Year? 0 points  What month? 0 points  What time? 0 points  Count back from 20 0 points  Months in reverse 0 points  Repeat phrase 0 points  Total Score 0   Immunizations Immunization History  Administered Date(s) Administered   DTaP 05/29/2011, 08/15/2011, 12/06/2011, 02/20/2012   DTaP / Hep B / IPV 05/15/2017, 11/06/2017, 10/01/2018   Hepatitis B, adult 05/29/2011, 08/15/2011, 12/06/2011   HiB (PRP-T) 05/15/2017, 11/06/2017, 10/01/2018   IPV 05/29/2011, 08/15/2011, 12/06/2011   Influenza,inj,Quad PF,6+ Mos 03/23/2015   MMR 03/23/2015, 04/08/2019    Pneumococcal Conjugate-13 05/29/2011, 08/15/2011, 12/06/2011, 02/20/2012, 05/15/2017, 11/06/2017, 10/01/2018   Pneumococcal Polysaccharide-23 03/23/2015, 04/08/2019   Zoster Recombinat (Shingrix) 05/15/2017, 11/06/2017   Covid vaccine- declined.  Tdap- notes this was recently received, agrees to bring immunization record to next scheduled appointment. See record for Dtap.  Cologuard- consent to order.   Health Maintenance Health Maintenance  Topic Date Due   TETANUS/TDAP  Never done   Fecal DNA (Cologuard)  05/21/2019   COVID-19 Vaccine (1) 10/10/2019 (Originally 03/15/1963)   INFLUENZA VACCINE  10/17/2019   MAMMOGRAM  04/07/2021   DEXA SCAN  Completed   Hepatitis C Screening  Completed   PNA vac Low Risk Adult  Completed   Dental Screening: Recommended annual dental exams for proper oral hygiene  Community Resource Referral / Chronic Care Management: CRR required this visit?  No   CCM required this visit?  No     Plan:   Keep all routine maintenance appointments.   Cpe 10/14/19 @ 1130; plans to fast. Patient requests lipid panel. Notes she has not taken crestor in several months.   I have personally reviewed and noted the following in the patients chart:    Medical and social history  Use of alcohol, tobacco or illicit drugs   Current medications and supplements  Functional ability and status  Nutritional status  Physical activity  Advanced directives  List of other physicians  Hospitalizations, surgeries, and ER visits in previous 12 months  Vitals  Screenings to include cognitive, depression, and falls  Referrals and appointments  In addition, I have reviewed and discussed with patient certain preventive protocols, quality metrics, and best practice recommendations. A written personalized care plan for preventive services as well as general preventive health recommendations were provided to patient via mychart.     Varney Biles, LPN   03/23/6058     Reviewed above information.  Agree with assessment and plan.  Order signed for cologuard.    Dr Nicki Reaper

## 2019-09-24 NOTE — Patient Instructions (Addendum)
[name] , Thank you for taking time to come for your Medicare Wellness Visit. I appreciate your ongoing commitment to your health goals. Please review the following plan we discussed and let me know if I can assist you in the future.   These are the goals we discussed: Goals      Patient Stated   .  Follow up with Primary Care Provider (pt-stated)      As needed       This is a list of the screening recommended for you and due dates:  Health Maintenance  Topic Date Due  . Tetanus Vaccine  Never done  . Cologuard (Stool DNA test)  05/21/2019  . COVID-19 Vaccine (1) 10/10/2019*  . Flu Shot  10/17/2019  . Mammogram  04/07/2021  . DEXA scan (bone density measurement)  Completed  .  Hepatitis C: One time screening is recommended by Center for Disease Control  (CDC) for  adults born from [birth year] through 1965.   Completed  . Pneumonia vaccines  Completed  *Topic was postponed. The date shown is not the original due date.    Immunizations Immunization History  Administered Date(s) Administered  . DTaP 05/29/2011, 08/15/2011, 12/06/2011, 02/20/2012  . DTaP / Hep B / IPV 05/15/2017, 11/06/2017, 10/01/2018  . Hepatitis B, adult 05/29/2011, 08/15/2011, 12/06/2011  . HiB (PRP-T) 05/15/2017, 11/06/2017, 10/01/2018  . IPV 05/29/2011, 08/15/2011, 12/06/2011  . Influenza,inj,Quad PF,6+ Mos 03/23/2015  . MMR 03/23/2015, 04/08/2019  . Pneumococcal Conjugate-13 05/29/2011, 08/15/2011, 12/06/2011, 02/20/2012, 05/15/2017, 11/06/2017, 10/01/2018  . Pneumococcal Polysaccharide-23 03/23/2015, 04/08/2019  . Zoster Recombinat (Shingrix) 05/15/2017, 11/06/2017   Advanced directives: End of life planning; Advance aging; Advanced directives discussed.  Copy of current HCPOA/Living Will requested.    Conditions/risks identified: none new  Follow up in one year for your annual wellness visit   Keep all routine maintenance appointments.   Cpe 10/14/19 @ 1130  Colorguard- ordered  Preventive Care  24 Years and Older, Female Preventive care refers to lifestyle choices and visits with your health care provider that can promote health and wellness. What does preventive care include?  A yearly physical exam. This is also called an annual well check.  Dental exams once or twice a year.  Routine eye exams. Ask your health care provider how often you should have your eyes checked.  Personal lifestyle choices, including:  Daily care of your teeth and gums.  Regular physical activity.  Eating a healthy diet.  Avoiding tobacco and drug use.  Limiting alcohol use.  Practicing safe sex.  Taking low-dose aspirin every day.  Taking vitamin and mineral supplements as recommended by your health care provider. What happens during an annual well check? The services and screenings done by your health care provider during your annual well check will depend on your age, overall health, lifestyle risk factors, and family history of disease. Counseling  Your health care provider may ask you questions about your:  Alcohol use.  Tobacco use.  Drug use.  Emotional well-being.  Home and relationship well-being.  Sexual activity.  Eating habits.  History of falls.  Memory and ability to understand (cognition).  Work and work Statistician.  Reproductive health. Screening  You may have the following tests or measurements:  Height, weight, and BMI.  Blood pressure.  Lipid and cholesterol levels. These may be checked every 5 years, or more frequently if you are over 44 years old.  Skin check.  Lung cancer screening. You may have this screening every year  starting at age 73 if you have a 30-pack-year history of smoking and currently smoke or have quit within the past 15 years.  Fecal occult blood test (FOBT) of the stool. You may have this test every year starting at age 80.  Flexible sigmoidoscopy or colonoscopy. You may have a sigmoidoscopy every 5 years or a colonoscopy  every 10 years starting at age 44.  Hepatitis C blood test.  Hepatitis B blood test.  Sexually transmitted disease (STD) testing.  Diabetes screening. This is done by checking your blood sugar (glucose) after you have not eaten for a while (fasting). You may have this done every 1-3 years.  Bone density scan. This is done to screen for osteoporosis. You may have this done starting at age 43.  Mammogram. This may be done every 1-2 years. Talk to your health care provider about how often you should have regular mammograms. Talk with your health care provider about your test results, treatment options, and if necessary, the need for more tests. Vaccines  Your health care provider may recommend certain vaccines, such as:  Influenza vaccine. This is recommended every year.  Tetanus, diphtheria, and acellular pertussis (Tdap, Td) vaccine. You may need a Td booster every 10 years.  Zoster vaccine. You may need this after age 67.  Pneumococcal 13-valent conjugate (PCV13) vaccine. One dose is recommended after age 75.  Pneumococcal polysaccharide (PPSV23) vaccine. One dose is recommended after age 53. Talk to your health care provider about which screenings and vaccines you need and how often you need them. This information is not intended to replace advice given to you by your health care provider. Make sure you discuss any questions you have with your health care provider. Document Released: 03/31/2015 Document Revised: 11/22/2015 Document Reviewed: 01/03/2015 Elsevier Interactive Patient Education  2017 [place] Prevention in the Home Falls can cause injuries. They can happen to people of all ages. There are many things you can do to make your home safe and to help prevent falls. What can I do on the outside of my home?  Regularly fix the edges of walkways and driveways and fix any cracks.  Remove anything that might make you trip as you walk through a door, such as a  raised step or threshold.  Trim any bushes or trees on the path to your home.  Use bright outdoor lighting.  Clear any walking paths of anything that might make someone trip, such as rocks or tools.  Regularly check to see if handrails are loose or broken. Make sure that both sides of any steps have handrails.  Any raised decks and porches should have guardrails on the edges.  Have any leaves, snow, or ice cleared regularly.  Use sand or salt on walking paths during winter.  Clean up any spills in your garage right away. This includes oil or grease spills. What can I do in the bathroom?  Use night lights.  Install grab bars by the toilet and in the tub and shower. Do not use towel bars as grab bars.  Use non-skid mats or decals in the tub or shower.  If you need to sit down in the shower, use a plastic, non-slip stool.  Keep the floor dry. Clean up any water that spills on the floor as soon as it happens.  Remove soap buildup in the tub or shower regularly.  Attach bath mats securely with double-sided non-slip rug tape.  Do not have throw rugs and other  things on the floor that can make you trip. What can I do in the bedroom?  Use night lights.  Make sure that you have a light by your bed that is easy to reach.  Do not use any sheets or blankets that are too big for your bed. They should not hang down onto the floor.  Have a firm chair that has side arms. You can use this for support while you get dressed.  Do not have throw rugs and other things on the floor that can make you trip. What can I do in the kitchen?  Clean up any spills right away.  Avoid walking on wet floors.  Keep items that you use a lot in easy-to-reach places.  If you need to reach something above you, use a strong step stool that has a grab bar.  Keep electrical cords out of the way.  Do not use floor polish or wax that makes floors slippery. If you must use wax, use non-skid floor  wax.  Do not have throw rugs and other things on the floor that can make you trip. What can I do with my stairs?  Do not leave any items on the stairs.  Make sure that there are handrails on both sides of the stairs and use them. Fix handrails that are broken or loose. Make sure that handrails are as long as the stairways.  Check any carpeting to make sure that it is firmly attached to the stairs. Fix any carpet that is loose or worn.  Avoid having throw rugs at the top or bottom of the stairs. If you do have throw rugs, attach them to the floor with carpet tape.  Make sure that you have a light switch at the top of the stairs and the bottom of the stairs. If you do not have them, ask someone to add them for you. What else can I do to help prevent falls?  Wear shoes that:  Do not have high heels.  Have rubber bottoms.  Are comfortable and fit you well.  Are closed at the toe. Do not wear sandals.  If you use a stepladder:  Make sure that it is fully opened. Do not climb a closed stepladder.  Make sure that both sides of the stepladder are locked into place.  Ask someone to hold it for you, if possible.  Clearly mark and make sure that you can see:  Any grab bars or handrails.  First and last steps.  Where the edge of each step is.  Use tools that help you move around (mobility aids) if they are needed. These include:  Canes.  Walkers.  Scooters.  Crutches.  Turn on the lights when you go into a dark area. Replace any light bulbs as soon as they burn out.  Set up your furniture so you have a clear path. Avoid moving your furniture around.  If any of your floors are uneven, fix them.  If there are any pets around you, be aware of where they are.  Review your medicines with your doctor. Some medicines can make you feel dizzy. This can increase your chance of falling. Ask your doctor what other things that you can do to help prevent falls. This information is  not intended to replace advice given to you by your health care provider. Make sure you discuss any questions you have with your health care provider. Document Released: 12/29/2008 Document Revised: 08/10/2015 Document Reviewed: 04/08/2014 Elsevier Interactive Patient  Education  2017 [employer].

## 2019-09-28 DIAGNOSIS — Z6825 Body mass index (BMI) 25.0-25.9, adult: Secondary | ICD-10-CM | POA: Diagnosis not present

## 2019-09-28 DIAGNOSIS — C9000 Multiple myeloma not having achieved remission: Secondary | ICD-10-CM | POA: Diagnosis not present

## 2019-09-28 DIAGNOSIS — T451X5A Adverse effect of antineoplastic and immunosuppressive drugs, initial encounter: Secondary | ICD-10-CM | POA: Diagnosis not present

## 2019-09-28 DIAGNOSIS — C9002 Multiple myeloma in relapse: Secondary | ICD-10-CM | POA: Diagnosis not present

## 2019-09-28 DIAGNOSIS — R21 Rash and other nonspecific skin eruption: Secondary | ICD-10-CM | POA: Diagnosis not present

## 2019-09-28 DIAGNOSIS — M79673 Pain in unspecified foot: Secondary | ICD-10-CM | POA: Diagnosis not present

## 2019-09-28 DIAGNOSIS — G629 Polyneuropathy, unspecified: Secondary | ICD-10-CM | POA: Diagnosis not present

## 2019-10-14 ENCOUNTER — Other Ambulatory Visit: Payer: Self-pay

## 2019-10-14 ENCOUNTER — Encounter: Payer: Self-pay | Admitting: Internal Medicine

## 2019-10-14 ENCOUNTER — Ambulatory Visit (INDEPENDENT_AMBULATORY_CARE_PROVIDER_SITE_OTHER): Payer: Medicare Other | Admitting: Internal Medicine

## 2019-10-14 DIAGNOSIS — Z0000 Encounter for general adult medical examination without abnormal findings: Secondary | ICD-10-CM

## 2019-10-14 DIAGNOSIS — C9001 Multiple myeloma in remission: Secondary | ICD-10-CM

## 2019-10-14 DIAGNOSIS — M79642 Pain in left hand: Secondary | ICD-10-CM

## 2019-10-14 DIAGNOSIS — E7800 Pure hypercholesterolemia, unspecified: Secondary | ICD-10-CM | POA: Diagnosis not present

## 2019-10-14 DIAGNOSIS — Z9882 Breast implant status: Secondary | ICD-10-CM

## 2019-10-14 DIAGNOSIS — R200 Anesthesia of skin: Secondary | ICD-10-CM | POA: Diagnosis not present

## 2019-10-14 DIAGNOSIS — E0789 Other specified disorders of thyroid: Secondary | ICD-10-CM | POA: Diagnosis not present

## 2019-10-14 DIAGNOSIS — D649 Anemia, unspecified: Secondary | ICD-10-CM | POA: Diagnosis not present

## 2019-10-14 NOTE — Progress Notes (Signed)
Patient ID: April Chapman, female   DOB: [birth year], 69 y.o.   MRN: [id number]   Subjective:    Patient ID: April Chapman, female    DOB: [birth year], 69 y.o.   MRN: [id number]  HPI This visit occurred during the SARS-CoV-2 public health emergency.  Safety protocols were in place, including screening questions prior to the visit, additional usage of staff PPE, and extensive cleaning of exam room while observing appropriate contact time as indicated for disinfecting solutions.  Patient with past history of multiple myeloma, nephrolithiasis, hypercholesterolemia.  She comes in today to follow up on these issues as well as for a complete physical exam.  She reports she is doing relatively well.  Followed by oncology [facility]).  Evaluated 04/2019.  Is s/p second autologous transplant.  No evidence of progressive disease.  Has neuropathy.  Plans to start gabapentin.  Has rx.  Plans to start '300mg'$  q hs.  Increased pain.  Feels like razor blades.  Request handicap form.  No chest pain or sob reported.  No abdominal pain or bowel change reported.  Some pain/tenderness - left hand.  Affects her using.  Discussed referral to ortho.  Handling stress.     Past Medical History:  Diagnosis Date  . Chronic kidney disease H/O   stones  . History of chicken pox   . Multiple myeloma (HCC)    s/p stem cell transplant (08/2010)   Past Surgical History:  Procedure Laterality Date  . APPENDECTOMY  03/2015  . APPENDECTOMY     Family History  Problem Relation Age of Onset  . Mental illness Mother   . Hypercholesterolemia Sister   . Colon cancer Neg Hx   . Breast cancer Neg Hx    Social History   Socioeconomic History  . Marital status: Widowed    Spouse name: Not on file  . Number of children: 2  . Years of education: Not on file  . Highest education level: Not on file  Occupational History  . Occupation: retired    Fish farm manager: [place]  Tobacco Use  . Smoking status: Never Smoker  . Smokeless  tobacco: Never Used  Vaping Use  . Vaping Use: Never used  Substance and Sexual Activity  . Alcohol use: No  . Drug use: No  . Sexual activity: Never  Other Topics Concern  . Not on file  Social History Narrative  . Not on file   Social Determinants of Health   Financial Resource Strain:   . Difficulty of Paying Living Expenses:   Food Insecurity:   . Worried About Charity fundraiser in the Last Year:   . Arboriculturist in the Last Year:   Transportation Needs:   . Film/video editor (Medical):   Marland Kitchen Lack of Transportation (Non-Medical):   Physical Activity:   . Days of Exercise per Week:   . Minutes of Exercise per Session:   Stress:   . Feeling of Stress :   Social Connections: Unknown  . Frequency of Communication with Friends and Family: More than three times a week  . Frequency of Social Gatherings with Friends and Family: More than three times a week  . Attends Religious Services: Not on file  . Active Member of Clubs or Organizations: Yes  . Attends Archivist Meetings: Not on file  . Marital Status: Widowed    Outpatient Encounter Medications as of 10/14/2019  Medication Sig  . rosuvastatin (CRESTOR) 5 MG tablet Take 1  tablet (5 mg total) by mouth daily. (Patient not taking: Reported on 09/24/2019)  . valACYclovir (VALTREX) 500 MG tablet    No facility-administered encounter medications on file as of 10/14/2019.    Review of Systems  Constitutional: Negative for appetite change and unexpected weight change.  HENT: Negative for congestion and sinus pressure.   Eyes: Negative for pain and visual disturbance.  Respiratory: Negative for cough, chest tightness and shortness of breath.   Cardiovascular: Negative for chest pain, palpitations and leg swelling.  Gastrointestinal: Negative for abdominal pain, diarrhea, nausea and vomiting.  Genitourinary: Negative for difficulty urinating and dysuria.  Musculoskeletal: Negative for back pain and joint  swelling.  Skin: Negative for color change and rash.  Neurological: Negative for dizziness and headaches.       Problems with pain - neuropathy as outlined.    Hematological: Negative for adenopathy. Does not bruise/bleed easily.  Psychiatric/Behavioral: Negative for agitation and dysphoric mood.       Objective:    Physical Exam Vitals reviewed.  Constitutional:      General: She is not in acute distress.    Appearance: Normal appearance. She is well-developed.  HENT:     Head: Normocephalic and atraumatic.     Right Ear: External ear normal.     Left Ear: External ear normal.  Eyes:     General: No scleral icterus.       Right eye: No discharge.        Left eye: No discharge.     Conjunctiva/sclera: Conjunctivae normal.  Neck:     Thyroid: No thyromegaly.  Cardiovascular:     Rate and Rhythm: Normal rate and regular rhythm.  Pulmonary:     Effort: No tachypnea, accessory muscle usage or respiratory distress.     Breath sounds: Normal breath sounds. No decreased breath sounds or wheezing.  Chest:     Breasts:        Right: No inverted nipple, mass, nipple discharge or tenderness (no axillary adenopathy).        Left: No inverted nipple, mass, nipple discharge or tenderness (no axilarry adenopathy).  Abdominal:     General: Bowel sounds are normal.     Palpations: Abdomen is soft.     Tenderness: There is no abdominal tenderness.  Musculoskeletal:        General: No tenderness.     Cervical back: Neck supple. No tenderness.  Lymphadenopathy:     Cervical: No cervical adenopathy.  Skin:    General: Skin is warm.     Findings: No erythema or rash.  Neurological:     Mental Status: She is alert and oriented to person, place, and time.  Psychiatric:        Mood and Affect: Mood normal.        Behavior: Behavior normal.     BP 122/78   Pulse 72   Temp 98.2 F (36.8 C)   Resp 16   Ht '5\' 8"'$  (1.727 m)   Wt 162 lb (73.5 kg)   SpO2 98%   BMI 24.63 kg/m  Wt  Readings from Last 3 Encounters:  10/14/19 162 lb (73.5 kg)  09/24/19 166 lb (75.3 kg)  04/12/19 166 lb (75.3 kg)     Lab Results  Component Value Date   WBC 8.1 01/21/2017   HGB 11.3 (L) 01/21/2017   HCT 34.4 (L) 01/21/2017   PLT 256.0 01/21/2017   GLUCOSE 122 (H) 01/21/2017   CHOL 197 10/09/2018   TRIG  71.0 10/09/2018   HDL 52.00 10/09/2018   LDLCALC 130 (H) 10/09/2018   ALT 8 01/21/2017   AST 14 01/21/2017   NA 136 01/21/2017   K 3.5 01/21/2017   CL 104 01/21/2017   CREATININE 0.80 10/30/2018   BUN 12 01/21/2017   CO2 22 01/21/2017   TSH 0.80 10/09/2018   INR 1.0 04/01/2014    MR ANGIO HEAD WO CONTRAST  Result Date: 10/30/2018 CLINICAL DATA:  Patient states history of tinnitus and hearing loss in left ear, 6-7 years, progressive, started hearing heartbeat in left ear 6 months ago. EXAM: MRI HEAD WITHOUT CONTRAST MRA HEAD WITHOUT CONTRAST TECHNIQUE: Multiplanar, multiecho pulse sequences of the brain and surrounding structures were obtained without intravenous contrast. An internal auditory canal protocol was utilized. Angiographic images of the head were obtained using MRA technique without contrast. CONTRAST:  7 mL Gadavist COMPARISON:  No pertinent prior studies available for comparison. FINDINGS: MRI HEAD FINDINGS Brain: No evidence of acute infarct or intracranial mass. No midline shift or extra-axial collection. There are three small nonspecific foci of T2/FLAIR hyperintensity within the right frontal lobe white matter. A chronic microhemorrhage is also present within the right frontal lobe white matter. No other focal parenchymal signal abnormality. No abnormal intracranial enhancement. No cerebellopontine angle mass or internal auditory canal lesion. The seventh and eighth cranial nerves are unremarkable bilaterally. Vascular: Reported separately Skull and upper cervical spine: Normal marrow signal. Sinuses/Orbits: The imaged globes and orbits demonstrate no acute  abnormality. Trace ethmoid sinus mucosal thickening. Trace fluid within right mastoid air cells. MRA HEAD FINDINGS The bilateral intracranial internal carotid arteries are patent. Apparent moderate narrowing of the distal lacerum/proximal cavernous right ICA. Otherwise, no significant stenosis within the intracranial internal carotid arteries. The bilateral middle and anterior cerebral arteries are patent without significant proximal stenosis. No anterior circulation aneurysm is identified. The distal right vertebral artery is non dominant and diminutive beyond the origin of the right PICA. Apparent focal stenosis within the distal right vertebral artery, which is difficult to quantify due to small vessel size. The distal left vertebral artery is dominant and widely patent, as is the basilar artery. The right PCA is patent without significant proximal stenosis. Predominantly fetal origin of the left PCA. The left PCA is patent without significant proximal stenosis. No posterior circulation aneurysm is identified. IMPRESSION: MRI BRAIN: - No evidence of acute intracranial abnormality. - No cerebellopontine angle mass or internal auditory canal lesion. - Three small signal changes within the right frontal lobe white matter are nonspecific, but may reflect minimal chronic small vessel ischemic disease. MRA HEAD: - An apparent moderate focal narrowing of the lacerum/cavernous right ICA is likely artifactual given the appearance of this vessel on concurrent brain MRI. - Apparent focal stenosis within the non-dominant distal right vertebral artery. This stenosis is difficult to quantify due to small vessel size. - Otherwise, no significant proximal arterial stenosis is demonstrated. - No intracranial aneurysm is identified. Electronically Signed   By: Kellie Simmering   On: 10/30/2018 16:32   MR BRAIN/IAC W WO CONTRAST  Result Date: 10/30/2018 CLINICAL DATA:  Patient states history of tinnitus and hearing loss in left  ear, 6-7 years, progressive, started hearing heartbeat in left ear 6 months ago. EXAM: MRI HEAD WITHOUT CONTRAST MRA HEAD WITHOUT CONTRAST TECHNIQUE: Multiplanar, multiecho pulse sequences of the brain and surrounding structures were obtained without intravenous contrast. An internal auditory canal protocol was utilized. Angiographic images of the head were obtained using MRA technique  without contrast. CONTRAST:  7 mL Gadavist COMPARISON:  No pertinent prior studies available for comparison. FINDINGS: MRI HEAD FINDINGS Brain: No evidence of acute infarct or intracranial mass. No midline shift or extra-axial collection. There are three small nonspecific foci of T2/FLAIR hyperintensity within the right frontal lobe white matter. A chronic microhemorrhage is also present within the right frontal lobe white matter. No other focal parenchymal signal abnormality. No abnormal intracranial enhancement. No cerebellopontine angle mass or internal auditory canal lesion. The seventh and eighth cranial nerves are unremarkable bilaterally. Vascular: Reported separately Skull and upper cervical spine: Normal marrow signal. Sinuses/Orbits: The imaged globes and orbits demonstrate no acute abnormality. Trace ethmoid sinus mucosal thickening. Trace fluid within right mastoid air cells. MRA HEAD FINDINGS The bilateral intracranial internal carotid arteries are patent. Apparent moderate narrowing of the distal lacerum/proximal cavernous right ICA. Otherwise, no significant stenosis within the intracranial internal carotid arteries. The bilateral middle and anterior cerebral arteries are patent without significant proximal stenosis. No anterior circulation aneurysm is identified. The distal right vertebral artery is non dominant and diminutive beyond the origin of the right PICA. Apparent focal stenosis within the distal right vertebral artery, which is difficult to quantify due to small vessel size. The distal left vertebral artery is  dominant and widely patent, as is the basilar artery. The right PCA is patent without significant proximal stenosis. Predominantly fetal origin of the left PCA. The left PCA is patent without significant proximal stenosis. No posterior circulation aneurysm is identified. IMPRESSION: MRI BRAIN: - No evidence of acute intracranial abnormality. - No cerebellopontine angle mass or internal auditory canal lesion. - Three small signal changes within the right frontal lobe white matter are nonspecific, but may reflect minimal chronic small vessel ischemic disease. MRA HEAD: - An apparent moderate focal narrowing of the lacerum/cavernous right ICA is likely artifactual given the appearance of this vessel on concurrent brain MRI. - Apparent focal stenosis within the non-dominant distal right vertebral artery. This stenosis is difficult to quantify due to small vessel size. - Otherwise, no significant proximal arterial stenosis is demonstrated. - No intracranial aneurysm is identified. Electronically Signed   By: Kellie Simmering   On: 10/30/2018 16:32       Assessment & Plan:   Problem List Items Addressed This Visit    Anemia    Follow cbc.       H/O bilateral breast implants    Saw Dr Stasia Cavalier.  Mammogram 03/2019 - no suspicious mass.        Hand pain, left    Persistent hand pain.  Limits use at times.  Refer to ortho.       Relevant Orders   Ambulatory referral to Orthopedic Surgery   Health care maintenance    Physical today 10/13/19.  cologuard negative 05/20/16.  Mammogram - 03/2019      Hypercholesterolemia    Off crestor.  Low cholesterol diet and exercise. Follow lipid panel.       Relevant Orders   Lipid panel   TSH   Comprehensive metabolic panel   Multiple myeloma in remission [facility])    S/p second transplant.  Followed by oncology.  Stable.       Numbness of toes    With neuropathy. Plans to start gabapentin as outlined.  Follow.        Relevant Orders   Vitamin B12   Thyroid  fullness    Evaluated by Dr Loney Loh.  Aliannah Holstrom, MD 

## 2019-10-17 ENCOUNTER — Encounter: Payer: Self-pay | Admitting: Internal Medicine

## 2019-10-17 DIAGNOSIS — M79642 Pain in left hand: Secondary | ICD-10-CM | POA: Insufficient documentation

## 2019-10-17 NOTE — Assessment & Plan Note (Signed)
Persistent hand pain.  Limits use at times.  Refer to ortho.

## 2019-10-17 NOTE — Assessment & Plan Note (Signed)
S/p second transplant.  Followed by oncology.  Stable.

## 2019-10-17 NOTE — Assessment & Plan Note (Addendum)
Physical today 10/13/19.  cologuard negative 05/20/16.  Mammogram - 03/2019

## 2019-10-17 NOTE — Assessment & Plan Note (Signed)
Off crestor.  Low cholesterol diet and exercise.  Follow lipid panel.  

## 2019-10-17 NOTE — Assessment & Plan Note (Signed)
Follow cbc.  

## 2019-10-17 NOTE — Assessment & Plan Note (Signed)
With neuropathy. Plans to start gabapentin as outlined.  Follow.

## 2019-10-17 NOTE — Assessment & Plan Note (Signed)
Evaluated by Dr Loney Loh.

## 2019-10-17 NOTE — Assessment & Plan Note (Addendum)
Saw Dr Stasia Cavalier.  Mammogram 03/2019 - no suspicious mass.

## 2019-10-21 ENCOUNTER — Telehealth: Payer: Medicare Other | Admitting: Physician Assistant

## 2019-10-21 DIAGNOSIS — N3000 Acute cystitis without hematuria: Secondary | ICD-10-CM | POA: Diagnosis not present

## 2019-10-21 MED ORDER — CEPHALEXIN 500 MG PO CAPS: 500.0000 mg | ORAL_CAPSULE | Freq: Two times a day (BID) | ORAL | 0 refills | Status: AC

## 2019-10-21 NOTE — Progress Notes (Addendum)
We are sorry that you are not feeling well.  Here is how we plan to help!  Based on what you shared with me it looks like you most likely have a simple urinary tract infection.  A UTI (Urinary Tract Infection) is a bacterial infection of the bladder.  Most cases of urinary tract infections are simple to treat but a key part of your care is to encourage you to drink plenty of fluids and watch your symptoms carefully.  I have prescribed Keflex 500 mg twice a day for 7 days.  Your symptoms should gradually improve. Call us if the burning in your urine worsens, you develop worsening fever, back pain or pelvic pain or if your symptoms do not resolve after completing the antibiotic.  Urinary tract infections can be prevented by drinking plenty of water to keep your body hydrated.  Also be sure when you wipe, wipe from front to back and don't hold it in!  If possible, empty your bladder every 4 hours.    This appointment required 5 minutes of patient care (this includes precharting, chart review, review of results, diagnosis and sending scripts, etc.).

## 2019-10-26 DIAGNOSIS — M65332 Trigger finger, left middle finger: Secondary | ICD-10-CM | POA: Diagnosis not present

## 2019-10-26 DIAGNOSIS — M720 Palmar fascial fibromatosis [Dupuytren]: Secondary | ICD-10-CM | POA: Diagnosis not present

## 2019-10-26 DIAGNOSIS — M65331 Trigger finger, right middle finger: Secondary | ICD-10-CM | POA: Diagnosis not present

## 2019-11-08 DIAGNOSIS — L821 Other seborrheic keratosis: Secondary | ICD-10-CM | POA: Diagnosis not present

## 2019-11-08 DIAGNOSIS — L570 Actinic keratosis: Secondary | ICD-10-CM | POA: Diagnosis not present

## 2019-11-12 ENCOUNTER — Other Ambulatory Visit: Payer: Self-pay

## 2019-11-12 ENCOUNTER — Ambulatory Visit
Admission: RE | Admit: 2019-11-12 | Discharge: 2019-11-12 | Disposition: A | Discharge status: Home / Self Care | Payer: Medicare Other | Source: Ambulatory Visit | Attending: Internal Medicine | Admitting: Internal Medicine

## 2019-11-12 VITALS — BP 152/90 | HR 81 | Temp 98.0°F | Resp 18 | Ht 68.0 in | Wt 162.0 lb

## 2019-11-12 DIAGNOSIS — L239 Allergic contact dermatitis, unspecified cause: Secondary | ICD-10-CM

## 2019-11-12 MED ORDER — METHYLPREDNISOLONE SODIUM SUCC 40 MG IJ SOLR
40.0000 mg | Freq: Once | INTRAMUSCULAR | Status: AC
  Administered 2019-11-12: 40 mg via INTRAMUSCULAR

## 2019-11-12 MED ORDER — METHYLPREDNISOLONE 4 MG PO TBPK: ORAL_TABLET | ORAL | 0 refills | Status: DC

## 2019-11-12 NOTE — ED Provider Notes (Addendum)
[facility]    CSN: [id number] Arrival date & time: 11/12/19  0815      History   Chief Complaint Chief Complaint  Patient presents with  . Rash  . Allergic Reaction    HPI April Chapman is a 69 y.o. female. who presents  With her daughter complaining with swelling and rash on her face which started yesterday and this am her L lids are swollen. She had been working in the yard 2 days ago, mainly with her poinsetas. She does not recall seeing any poison IV there. She has itching and mild pain on the areas of discomfort. Denies trouble swallowing, or breathing. She does not have a rash anywhere else but her neck and face.     Past Medical History:  Diagnosis Date  . Chronic kidney disease H/O   stones  . History of chicken pox   . Multiple myeloma (HCC)    s/p stem cell transplant (08/2010)    Patient Active Problem List   Diagnosis Date Noted  . Hand pain, left 10/17/2019  . H/O bilateral breast implants 10/10/2018  . Tinnitus 10/10/2018  . Abdominal pain 01/26/2017  . Anemia 01/26/2017  . Tachycardia 04/21/2016  . Thyroid fullness 04/15/2016  . Hypercholesterolemia 04/15/2016  . Health care maintenance 11/14/2015  . Multiple myeloma in remission ([place]) 03/23/2015  . Toenail fungus 10/24/2013  . Numbness of toes 10/22/2013  . Shoulder pain, left 04/27/2013  . Elbow pain, right 04/27/2013  . Heel pain 04/27/2013  . Cat scratch 04/27/2013  . Leg skin lesion, left 04/27/2013  . Multiple myeloma ([place]) 01/04/2013    Past Surgical History:  Procedure Laterality Date  . APPENDECTOMY  03/2015  . APPENDECTOMY      OB History    Gravida  2   Para  2   Term      Preterm      AB      Living        SAB      TAB      Ectopic      Multiple      Live Births               Home Medications    Prior to Admission medications   Medication Sig Start Date End Date Taking? Authorizing Provider  rosuvastatin (CRESTOR) 5 MG tablet Take 1  tablet (5 mg total) by mouth daily. Patient not taking: Reported on 09/24/2019 10/12/18   Einar Pheasant, MD  valACYclovir Estell Harpin) 500 MG tablet  05/11/19   [provider]    Family History Family History  Problem Relation Age of Onset  . Mental illness Mother   . Hypercholesterolemia Sister   . Colon cancer Neg Hx   . Breast cancer Neg Hx     Social History Social History   Tobacco Use  . Smoking status: Never Smoker  . Smokeless tobacco: Never Used  Vaping Use  . Vaping Use: Never used  Substance Use Topics  . Alcohol use: No  . Drug use: No     Allergies   Revlimid [lenalidomide] and Pomalidomide   Review of Systems Review of Systems  Constitutional: Negative for appetite change, chills and fever.  HENT: Negative for congestion, postnasal drip, rhinorrhea, sore throat, trouble swallowing and voice change.   Eyes: Negative for pain, discharge and visual disturbance.       Has eye lid itching and swelling L>R  Respiratory: Negative for shortness of breath.  Cardiovascular: Negative for chest pain.  Gastrointestinal: Negative for abdominal pain, diarrhea, nausea and vomiting.  Musculoskeletal: Negative for myalgias.  Skin: Positive for rash.  Neurological: Negative for dizziness.  Hematological: Negative for adenopathy.     Physical Exam Triage Vital Signs ED Triage Vitals [11/12/19 0820]  Enc Vitals Group     BP (!) 152/90     Pulse Rate 81     Resp 18     Temp 98 F (36.7 C)     Temp Source Oral     SpO2 98 %     Weight 162 lb (73.5 kg)     Height '5\' 8"'  (1.727 m)     Head Circumference      Peak Flow      Pain Score 0     Pain Loc      Pain Edu?      Excl. in Jud?    No data found.  Updated Vital Signs BP (!) 152/90 (BP Location: Left Arm)   Pulse 81   Temp 98 F (36.7 C) (Oral)   Resp 18   Ht '5\' 8"'  (1.727 m)   Wt 162 lb (73.5 kg)   SpO2 98%   BMI 24.63 kg/m   Visual Acuity Right Eye Distance:   Left Eye Distance:     Bilateral Distance:    Right Eye Near:   Left Eye Near:    Bilateral Near:     Physical Exam Constitutional:      General: She is not in acute distress.    Appearance: She is not toxic-appearing.  HENT:     Head: Atraumatic.     Right Ear: External ear normal.     Left Ear: External ear normal.     Nose: Nose normal.  Eyes:     General: No scleral icterus.    Extraocular Movements: Extraocular movements intact.     Conjunctiva/sclera: Conjunctivae normal.     Pupils: Pupils are equal, round, and reactive to light.  Cardiovascular:     Rate and Rhythm: Normal rate and regular rhythm.  Pulmonary:     Effort: Pulmonary effort is normal.     Breath sounds: Normal breath sounds.  Musculoskeletal:        General: Normal range of motion.     Cervical back: Neck supple.  Skin:    General: Skin is warm and dry.     Findings: Rash present.     Comments: Has moderate swelling of her L upper and lower lids and cheek, much milder of the R lids. Has some crusting/scab on R face by her nose with no oozing. The rash on her face and neck are slightly raised and erythematous and warm, but not hot. Has a fine papular sensation to palpation. None present on arms, chest or legs.   Neurological:     Mental Status: She is alert and oriented to person, place, and time.     Gait: Gait normal.  Psychiatric:        Mood and Affect: Mood normal.        Behavior: Behavior normal.        Thought Content: Thought content normal.        Judgment: Judgment normal.      UC Treatments / Results  Labs (all labs ordered are listed, but only abnormal results are displayed) Labs Reviewed - No data to display  EKG   Radiology No results found.  Procedures Procedures (including critical care time)  Medications Ordered in UC Medications - No data to display  Initial Impression / Assessment and Plan / UC Course  I have reviewed the triage vital signs and the nursing notes. She was given  solumedrol 40 mg IM here and is to start the medrol dose pack tomorrow as directed. See instructions.  Final Clinical Impressions(s) / UC Diagnoses   Final diagnoses:  None   Discharge Instructions   None    ED Prescriptions    None     PDMP not reviewed this encounter.   Shelby Mattocks, PA-C 11/12/19 8020    Rodriguez-Southworth, Sunday Spillers, PA-C 11/12/19 [phone]

## 2019-11-12 NOTE — Discharge Instructions (Signed)
You may continue the Benadryl as needed for itching Apply ice on swelling area for 5-10 minutes 3-5 times a day for 1-2 days.

## 2019-11-12 NOTE — ED Triage Notes (Signed)
Patient presents to [facility] and started noticing a rash and redness on her face. Patient states that area is extremely itchy and has caused swelling to her face. States that she feels like her throat is itchy as well. States that she has taken benadryl for 24 hours.

## 2019-11-30 DIAGNOSIS — C9000 Multiple myeloma not having achieved remission: Secondary | ICD-10-CM | POA: Diagnosis not present

## 2019-11-30 DIAGNOSIS — Z6825 Body mass index (BMI) 25.0-25.9, adult: Secondary | ICD-10-CM | POA: Diagnosis not present

## 2019-12-16 DIAGNOSIS — M6702 Short Achilles tendon (acquired), left ankle: Secondary | ICD-10-CM | POA: Diagnosis not present

## 2019-12-16 DIAGNOSIS — G620 Drug-induced polyneuropathy: Secondary | ICD-10-CM | POA: Diagnosis not present

## 2019-12-16 DIAGNOSIS — R600 Localized edema: Secondary | ICD-10-CM | POA: Diagnosis not present

## 2019-12-16 DIAGNOSIS — M810 Age-related osteoporosis without current pathological fracture: Secondary | ICD-10-CM | POA: Diagnosis not present

## 2019-12-16 DIAGNOSIS — M1990 Unspecified osteoarthritis, unspecified site: Secondary | ICD-10-CM | POA: Diagnosis not present

## 2019-12-16 DIAGNOSIS — M24575 Contracture, left foot: Secondary | ICD-10-CM | POA: Diagnosis not present

## 2019-12-16 DIAGNOSIS — R609 Edema, unspecified: Secondary | ICD-10-CM | POA: Diagnosis not present

## 2019-12-16 DIAGNOSIS — C9000 Multiple myeloma not having achieved remission: Secondary | ICD-10-CM | POA: Diagnosis not present

## 2019-12-16 DIAGNOSIS — C9001 Multiple myeloma in remission: Secondary | ICD-10-CM | POA: Diagnosis not present

## 2019-12-16 DIAGNOSIS — T451X5S Adverse effect of antineoplastic and immunosuppressive drugs, sequela: Secondary | ICD-10-CM | POA: Diagnosis not present

## 2019-12-16 DIAGNOSIS — N189 Chronic kidney disease, unspecified: Secondary | ICD-10-CM | POA: Diagnosis not present

## 2019-12-16 DIAGNOSIS — M722 Plantar fascial fibromatosis: Secondary | ICD-10-CM | POA: Diagnosis not present

## 2019-12-16 DIAGNOSIS — E7800 Pure hypercholesterolemia, unspecified: Secondary | ICD-10-CM | POA: Diagnosis not present

## 2020-01-13 ENCOUNTER — Other Ambulatory Visit (INDEPENDENT_AMBULATORY_CARE_PROVIDER_SITE_OTHER): Payer: Medicare Other

## 2020-01-13 ENCOUNTER — Other Ambulatory Visit: Payer: Self-pay

## 2020-01-13 DIAGNOSIS — E7800 Pure hypercholesterolemia, unspecified: Secondary | ICD-10-CM

## 2020-01-13 DIAGNOSIS — R200 Anesthesia of skin: Secondary | ICD-10-CM | POA: Diagnosis not present

## 2020-01-13 LAB — COMPREHENSIVE METABOLIC PANEL
ALT: 10 U/L (ref 0–35)
AST: 16 U/L (ref 0–37)
Albumin: 4 g/dL (ref 3.5–5.2)
Alkaline Phosphatase: 101 U/L (ref 39–117)
BUN: 20 mg/dL (ref 6–23)
CO2: 27 mEq/L (ref 19–32)
Calcium: 9.1 mg/dL (ref 8.4–10.5)
Chloride: 105 mEq/L (ref 96–112)
Creatinine, Ser: 0.79 mg/dL (ref 0.40–1.20)
GFR: 76.64 mL/min (ref >60.00)
Glucose, Bld: 91 mg/dL (ref 70–99)
Potassium: 3.9 mEq/L (ref 3.5–5.1)
Sodium: 139 mEq/L (ref 135–145)
Total Bilirubin: 0.7 mg/dL (ref 0.2–1.2)
Total Protein: 6 g/dL (ref 6.0–8.3)

## 2020-01-13 LAB — LIPID PANEL
Cholesterol: 205 mg/dL — ABNORMAL HIGH (ref 0–200)
HDL: 58.5 mg/dL (ref >39.00)
LDL Cholesterol: 131 mg/dL — ABNORMAL HIGH (ref 0–99)
NonHDL: 146.88
Total CHOL/HDL Ratio: 4
Triglycerides: 78 mg/dL (ref 0.0–149.0)
VLDL: 15.6 mg/dL (ref 0.0–40.0)

## 2020-01-13 LAB — TSH: TSH: 1.39 u[IU]/mL (ref 0.35–4.50)

## 2020-01-13 LAB — VITAMIN B12: Vitamin B-12: 270 pg/mL (ref 211–911)

## 2020-01-14 ENCOUNTER — Other Ambulatory Visit: Payer: Medicare Other

## 2020-01-18 ENCOUNTER — Other Ambulatory Visit: Payer: Self-pay

## 2020-01-18 MED ORDER — ROSUVASTATIN CALCIUM 5 MG PO TABS
5.0000 mg | ORAL_TABLET | Freq: Every day | ORAL | 2 refills | Status: DC
Start: 2020-01-18 — End: 2020-11-29

## 2020-02-08 DIAGNOSIS — T451X5A Adverse effect of antineoplastic and immunosuppressive drugs, initial encounter: Secondary | ICD-10-CM | POA: Diagnosis not present

## 2020-02-08 DIAGNOSIS — M722 Plantar fascial fibromatosis: Secondary | ICD-10-CM | POA: Diagnosis not present

## 2020-02-08 DIAGNOSIS — M79673 Pain in unspecified foot: Secondary | ICD-10-CM | POA: Diagnosis not present

## 2020-02-08 DIAGNOSIS — Z6825 Body mass index (BMI) 25.0-25.9, adult: Secondary | ICD-10-CM | POA: Diagnosis not present

## 2020-02-08 DIAGNOSIS — G629 Polyneuropathy, unspecified: Secondary | ICD-10-CM | POA: Diagnosis not present

## 2020-02-08 DIAGNOSIS — R21 Rash and other nonspecific skin eruption: Secondary | ICD-10-CM | POA: Diagnosis not present

## 2020-02-08 DIAGNOSIS — C9000 Multiple myeloma not having achieved remission: Secondary | ICD-10-CM | POA: Diagnosis not present

## 2020-02-08 DIAGNOSIS — M24542 Contracture, left hand: Secondary | ICD-10-CM | POA: Diagnosis not present

## 2020-02-29 ENCOUNTER — Other Ambulatory Visit: Payer: Medicare Other

## 2020-03-10 ENCOUNTER — Ambulatory Visit
Admission: EM | Admit: 2020-03-10 | Discharge: 2020-03-10 | Disposition: A | Discharge status: Home / Self Care | Payer: Medicare Other | Attending: Sports Medicine | Admitting: Sports Medicine

## 2020-03-10 ENCOUNTER — Encounter: Payer: Self-pay | Admitting: Emergency Medicine

## 2020-03-10 ENCOUNTER — Ambulatory Visit (INDEPENDENT_AMBULATORY_CARE_PROVIDER_SITE_OTHER): Payer: Medicare Other

## 2020-03-10 ENCOUNTER — Other Ambulatory Visit: Payer: Self-pay

## 2020-03-10 ENCOUNTER — Ambulatory Visit
Admission: EM | Admit: 2020-03-10 | Disposition: A | Discharge status: Home / Self Care | Payer: Self-pay | Source: Home / Self Care

## 2020-03-10 DIAGNOSIS — R059 Cough, unspecified: Secondary | ICD-10-CM | POA: Diagnosis not present

## 2020-03-10 DIAGNOSIS — Z20822 Contact with and (suspected) exposure to covid-19: Secondary | ICD-10-CM | POA: Insufficient documentation

## 2020-03-10 DIAGNOSIS — J189 Pneumonia, unspecified organism: Secondary | ICD-10-CM

## 2020-03-10 LAB — RESP PANEL BY RT-PCR (FLU A&B, COVID) ARPGX2
Influenza A by PCR: NEGATIVE
Influenza B by PCR: NEGATIVE
SARS Coronavirus 2 by RT PCR: NEGATIVE

## 2020-03-10 MED ORDER — BENZONATATE 100 MG PO CAPS: 100.0000 mg | ORAL_CAPSULE | Freq: Three times a day (TID) | ORAL | 0 refills | Status: DC

## 2020-03-10 MED ORDER — ALBUTEROL SULFATE HFA 108 (90 BASE) MCG/ACT IN AERS: 2.0000 | INHALATION_SPRAY | RESPIRATORY_TRACT | 0 refills | Status: DC | PRN

## 2020-03-10 MED ORDER — AMOXICILLIN-POT CLAVULANATE 875-125 MG PO TABS: 1.0000 | ORAL_TABLET | Freq: Two times a day (BID) | ORAL | 0 refills | Status: DC

## 2020-03-10 NOTE — ED Triage Notes (Signed)
Patient c/o cough and chest congestion for the past 12 days. Patient reports fever last night.

## 2020-03-10 NOTE — Discharge Instructions (Addendum)
I recommended we go ahead and check her for influenza and Covid.  She was negative in the office today. Given the abnormal chest exam on auscultation with bilateral rhonchi, I recommended getting a chest x-ray to rule out a pneumonia.  X-ray was independently read by myself.  Does show probable infiltrate versus atelectasis.  We will treat accordingly. We will give her Augmentin 875 twice daily for 10 days. Given her cough and her respiratory exam I will go ahead and give her an albuterol inhaler to be used as directed. We will also prescribe Tessalon Perles for her cough. She can use over-the-counter Mucinex, over-the-counter Delsym or Robitussin.  She can also use Tylenol Motrin for fever discomfort. We will give her educational handout on community-acquired pneumonia.  I did discuss that if she got acutely short of breath or was having difficulty progressing on antibiotic therapy that she should seek out medical attention either here or in an emergency room setting.  She voiced verbal understanding. she told me that she has not been vaccinated against Covid, with her multiple myeloma history I encouraged her to go ahead and get that taken care of sooner rather than later. Follow-up as needed.

## 2020-03-10 NOTE — ED Provider Notes (Signed)
[facility]    CSN: [id number] Arrival date & time: 03/10/20  1401      History   Chief Complaint Chief Complaint  Patient presents with  . Cough    HPI April Chapman is a 69 y.o. female.   Patient is a pleasant 69-year-old female who presents for evaluation of 12 days of URI type symptoms.  She says that she has had a cough this productive and thought it would get better but it is not getting better and in fact may be a little bit worse.  She also has associated congestion with postnasal drip.  She denies any shortness of breath chest pain or ear pain.  She says that her chest does cause her some discomfort from all of the coughing.  She is noted some fever and chills.  She has no history of asthma and no smoking history.  No nausea vomiting diarrhea.  No urinary symptoms or abdominal pain.  Given the fact she is not improving she comes in today for to the urgent care for evaluation.  No red flag signs or symptoms.     Past Medical History:  Diagnosis Date  . Chronic kidney disease H/O   stones  . History of chicken pox   . Multiple myeloma (HCC)    s/p stem cell transplant (08/2010)    Patient Active Problem List   Diagnosis Date Noted  . Hand pain, left 10/17/2019  . H/O bilateral breast implants 10/10/2018  . Tinnitus 10/10/2018  . Abdominal pain 01/26/2017  . Anemia 01/26/2017  . Tachycardia 04/21/2016  . Thyroid fullness 04/15/2016  . Hypercholesterolemia 04/15/2016  . Health care maintenance 11/14/2015  . Multiple myeloma in remission (HCC) 03/23/2015  . Toenail fungus 10/24/2013  . Numbness of toes 10/22/2013  . Shoulder pain, left 04/27/2013  . Elbow pain, right 04/27/2013  . Heel pain 04/27/2013  . Cat scratch 04/27/2013  . Leg skin lesion, left 04/27/2013  . Multiple myeloma (HCC) 01/04/2013    Past Surgical History:  Procedure Laterality Date  . APPENDECTOMY  03/2015  . APPENDECTOMY      OB History    Gravida  2   Para  2    Term      Preterm      AB      Living        SAB      IAB      Ectopic      Multiple      Live Births               Home Medications    Prior to Admission medications   Medication Sig Start Date End Date Taking? Authorizing Provider  rosuvastatin (CRESTOR) 5 MG tablet Take 1 tablet (5 mg total) by mouth daily. 01/18/20  Yes Scott, Charlene, MD  amoxicillin-clavulanate (AUGMENTIN) 875-125 MG tablet Take 1 tablet by mouth every 12 (twelve) hours. 03/10/20   Barnes, Kenneth, MD  methylPREDNISolone (MEDROL DOSEPAK) 4 MG TBPK tablet Take as directed per pack starting 8/28 11/12/19   Rodriguez-Southworth, Sylvia, PA-C    Family History Family History  Problem Relation Age of Onset  . Mental illness Mother   . Hypercholesterolemia Sister   . Colon cancer Neg Hx   . Breast cancer Neg Hx     Social History Social History   Tobacco Use  . Smoking status: Never Smoker  . Smokeless tobacco: Never Used  Vaping Use  . Vaping Use: Never used    Substance Use Topics  . Alcohol use: No  . Drug use: No     Allergies   Revlimid [lenalidomide] and Pomalidomide   Review of Systems Review of Systems  Constitutional: Positive for chills, fatigue and fever. Negative for activity change, appetite change and diaphoresis.  HENT: Positive for congestion, postnasal drip and rhinorrhea. Negative for ear pain, sinus pressure, sinus pain and sore throat.   Respiratory: Positive for cough. Negative for chest tightness, shortness of breath and stridor.   Cardiovascular: Negative for chest pain.  Gastrointestinal: Negative for abdominal pain.  Genitourinary: Negative for dysuria.  Skin: Negative for color change, pallor, rash and wound.  Neurological: Negative for dizziness, facial asymmetry, weakness, light-headedness, numbness and headaches.  All other systems reviewed and are negative.    Physical Exam Triage Vital Signs ED Triage Vitals  Enc Vitals Group     BP 03/10/20  1441 117/77     Pulse Rate 03/10/20 1441 70     Resp 03/10/20 1441 16     Temp 03/10/20 1441 98.7 F (37.1 C)     Temp Source 03/10/20 1441 Oral     SpO2 03/10/20 1441 98 %     Weight 03/10/20 1438 162 lb (73.5 kg)     Height 03/10/20 1438 5' 8" (1.727 m)     Head Circumference --      Peak Flow --      Pain Score 03/10/20 1438 0     Pain Loc --      Pain Edu? --      Excl. in [place]? --    No data found.  Updated Vital Signs BP 117/77 (BP Location: Right Arm)   Pulse 70   Temp 98.7 F (37.1 C) (Oral)   Resp 16   Ht 5' 8" (1.727 m)   Wt 73.5 kg   SpO2 98%   BMI 24.63 kg/m   Visual Acuity Right Eye Distance:   Left Eye Distance:   Bilateral Distance:    Right Eye Near:   Left Eye Near:    Bilateral Near:     Physical Exam Vitals and nursing note reviewed.  Constitutional:      General: She is not in acute distress.    Appearance: Normal appearance. She is not ill-appearing, toxic-appearing or diaphoretic.     Comments: Patient is coughing throughout the entire history and physical exam.  HENT:     Head: Normocephalic and atraumatic.     Right Ear: Tympanic membrane normal.     Left Ear: Tympanic membrane normal.     Nose: Nose normal.     Mouth/Throat:     Mouth: Mucous membranes are moist.     Pharynx: Posterior oropharyngeal erythema present. No oropharyngeal exudate.  Eyes:     Extraocular Movements: Extraocular movements intact.     Conjunctiva/sclera: Conjunctivae normal.     Pupils: Pupils are equal, round, and reactive to light.  Cardiovascular:     Rate and Rhythm: Normal rate and regular rhythm.     Pulses: Normal pulses.     Heart sounds: Normal heart sounds. No murmur heard. No friction rub. No gallop.   Pulmonary:     Effort: Pulmonary effort is normal. No respiratory distress.     Breath sounds: No stridor. Rhonchi present. No wheezing or rales.  Chest:     Chest wall: No tenderness.  Musculoskeletal:     Cervical back: Normal range of  motion and neck supple. No rigidity or tenderness.  Lymphadenopathy:     Cervical: Cervical adenopathy present.  Skin:    General: Skin is warm and dry.     Capillary Refill: Capillary refill takes less than 2 seconds.     Coloration: Skin is not pale.     Findings: No erythema or rash.  Neurological:     General: No focal deficit present.     Mental Status: She is alert and oriented to person, place, and time.      UC Treatments / Results  Labs (all labs ordered are listed, but only abnormal results are displayed) Labs Reviewed  RESP PANEL BY RT-PCR (FLU A&B, COVID) [id number]    EKG   Radiology DG Chest 2 View  Result Date: 03/10/2020 CLINICAL DATA:  Cough for 12 days EXAM: CHEST - 2 VIEW COMPARISON:  04/02/2014 FINDINGS: Normal cardiac silhouette. Linear opacities posterior to the heart. No pleural fluid. No pneumothorax. No acute osseous abnormality. Breast implants noted. IMPRESSION: LEFT lower lobe atelectasis versus infiltrate Electronically Signed   By: Suzy Bouchard M.D.   On: 03/10/2020 15:33    Procedures Procedures (including critical care time)  Medications Ordered in UC Medications - No data to display  Initial Impression / Assessment and Plan / UC Course  I have reviewed the triage vital signs and the nursing notes.  Pertinent labs & imaging results that were available during my care of the patient were reviewed by me and considered in my medical decision making (see chart for details).   Clinical impression:  69 year old female with almost 2 weeks of cough that is productive at times with associated congestion and postnasal drip.  Patient has associated fevers and chills.  She has attempted conservative management in outpatient setting with over-the-counter meds but thinks as though her symptoms have actually worsened.  Treatment plan: 1.  The findings and treatment plan were discussed in detail with the patient.  Patient was in agreement. 2.  I  recommended we go ahead and check her for influenza and Covid.  She was negative in the office today. 3.  Given the abnormal chest exam on auscultation with bilateral rhonchi, I recommended getting a chest x-ray to rule out a pneumonia.  X-ray was independently read by myself.  Does show probable infiltrate versus atelectasis.  We will treat accordingly. 4.  We will give her Augmentin 875 twice daily for 10 days. 5.  Given her cough and her respiratory exam I will go ahead and give her an albuterol inhaler to be used as directed. 6.  We will also prescribe Tessalon Perles for her cough. 7.  She can use over-the-counter Mucinex, over-the-counter Delsym or Robitussin.  She can also use Tylenol Motrin for fever discomfort. 8.  We will give her educational handout on community-acquired pneumonia.  I did discuss that if she got acutely short of breath or was having difficulty progressing on antibiotic therapy that she should seek out medical attention either here or in an emergency room setting.  She voiced verbal understanding. 9 she told me that she has not been vaccinated against Covid, with her multiple myeloma history I encouraged her to go ahead and get that taken care of sooner rather than later. 10.  Follow-up as needed.     Final Clinical Impressions(s) / UC Diagnoses   Final diagnoses:  Community acquired pneumonia of left lower lobe of lung  Cough     Discharge Instructions     I recommended we go ahead and check her for influenza and  Covid.  She was negative in the office today. Given the abnormal chest exam on auscultation with bilateral rhonchi, I recommended getting a chest x-ray to rule out a pneumonia.  X-ray was independently read by myself.  Does show probable infiltrate versus atelectasis.  We will treat accordingly. We will give her Augmentin 875 twice daily for 10 days. Given her cough and her respiratory exam I will go ahead and give her an albuterol inhaler to be used as  directed. We will also prescribe Tessalon Perles for her cough. She can use over-the-counter Mucinex, over-the-counter Delsym or Robitussin.  She can also use Tylenol Motrin for fever discomfort. We will give her educational handout on community-acquired pneumonia.  I did discuss that if she got acutely short of breath or was having difficulty progressing on antibiotic therapy that she should seek out medical attention either here or in an emergency room setting.  She voiced verbal understanding. she told me that she has not been vaccinated against Covid, with her multiple myeloma history I encouraged her to go ahead and get that taken care of sooner rather than later. Follow-up as needed.    ED Prescriptions    Medication Sig Dispense Auth. Provider   amoxicillin-clavulanate (AUGMENTIN) 875-125 MG tablet Take 1 tablet by mouth every 12 (twelve) hours. 20 tablet Barnes, Kenneth, MD     PDMP not reviewed this encounter.   Barnes, Kenneth, MD 03/10/20 1557  

## 2020-03-28 DIAGNOSIS — E041 Nontoxic single thyroid nodule: Secondary | ICD-10-CM | POA: Diagnosis not present

## 2020-04-04 DIAGNOSIS — E041 Nontoxic single thyroid nodule: Secondary | ICD-10-CM | POA: Diagnosis not present

## 2020-04-10 DIAGNOSIS — C9000 Multiple myeloma not having achieved remission: Secondary | ICD-10-CM | POA: Diagnosis not present

## 2020-04-10 DIAGNOSIS — G620 Drug-induced polyneuropathy: Secondary | ICD-10-CM | POA: Diagnosis not present

## 2020-04-10 DIAGNOSIS — T451X5A Adverse effect of antineoplastic and immunosuppressive drugs, initial encounter: Secondary | ICD-10-CM | POA: Diagnosis not present

## 2020-04-10 DIAGNOSIS — Z6825 Body mass index (BMI) 25.0-25.9, adult: Secondary | ICD-10-CM | POA: Diagnosis not present

## 2020-04-10 DIAGNOSIS — Z79899 Other long term (current) drug therapy: Secondary | ICD-10-CM | POA: Diagnosis not present

## 2020-04-10 DIAGNOSIS — T4275XA Adverse effect of unspecified antiepileptic and sedative-hypnotic drugs, initial encounter: Secondary | ICD-10-CM | POA: Diagnosis not present

## 2020-04-10 DIAGNOSIS — M722 Plantar fascial fibromatosis: Secondary | ICD-10-CM | POA: Diagnosis not present

## 2020-04-10 DIAGNOSIS — R252 Cramp and spasm: Secondary | ICD-10-CM | POA: Diagnosis not present

## 2020-04-10 DIAGNOSIS — Z9484 Stem cells transplant status: Secondary | ICD-10-CM | POA: Diagnosis not present

## 2020-04-10 DIAGNOSIS — C9002 Multiple myeloma in relapse: Secondary | ICD-10-CM | POA: Diagnosis not present

## 2020-04-10 DIAGNOSIS — L270 Generalized skin eruption due to drugs and medicaments taken internally: Secondary | ICD-10-CM | POA: Diagnosis not present

## 2020-04-14 ENCOUNTER — Other Ambulatory Visit: Payer: Medicare Other

## 2020-04-17 ENCOUNTER — Encounter: Payer: Self-pay | Admitting: *Deleted

## 2020-04-17 ENCOUNTER — Ambulatory Visit: Payer: Medicare Other | Admitting: Internal Medicine

## 2020-05-23 ENCOUNTER — Other Ambulatory Visit: Payer: Self-pay

## 2020-05-23 ENCOUNTER — Ambulatory Visit
Admission: RE | Admit: 2020-05-23 | Discharge: 2020-05-23 | Disposition: A | Discharge status: Home / Self Care | Payer: Medicare Other | Source: Ambulatory Visit | Attending: Sports Medicine | Admitting: Sports Medicine

## 2020-05-23 VITALS — BP 132/89 | HR 76 | Temp 97.7°F | Resp 18 | Ht 68.0 in | Wt 162.0 lb

## 2020-05-23 DIAGNOSIS — R350 Frequency of micturition: Secondary | ICD-10-CM

## 2020-05-23 DIAGNOSIS — R3915 Urgency of urination: Secondary | ICD-10-CM | POA: Diagnosis not present

## 2020-05-23 DIAGNOSIS — N390 Urinary tract infection, site not specified: Secondary | ICD-10-CM

## 2020-05-23 DIAGNOSIS — R300 Dysuria: Secondary | ICD-10-CM

## 2020-05-23 LAB — URINALYSIS, COMPLETE (UACMP) WITH MICROSCOPIC
Bilirubin Urine: NEGATIVE
Glucose, UA: NEGATIVE mg/dL
Ketones, ur: NEGATIVE mg/dL
Nitrite: POSITIVE — AB
Protein, ur: 30 mg/dL — AB
Specific Gravity, Urine: 1.025 (ref 1.005–1.030)
WBC, UA: >50 WBC/hpf (ref 0–5)
pH: 5.5 (ref 5.0–8.0)

## 2020-05-23 MED ORDER — NITROFURANTOIN MONOHYD MACRO 100 MG PO CAPS: 100.0000 mg | ORAL_CAPSULE | Freq: Two times a day (BID) | ORAL | 0 refills | Status: DC

## 2020-05-23 NOTE — ED Triage Notes (Signed)
Patient states that she has been having painful urination with urgency and frequency x 2 weeks.

## 2020-05-23 NOTE — ED Provider Notes (Signed)
[facility]    CSN: [id number] Arrival date & time: 05/23/20  1135      History   Chief Complaint Chief Complaint  Patient presents with  . Dysuria    HPI April Chapman is a 70 y.o. female.   Pleasant 70 year old female who presents for evaluation of the above issue.  She reports that she has had 2 weeks of burning on urination increased frequency and urgency and decreased volume with each void.  No blood noted.  She was hoping that over-the-counter remedies would help but since they have not she comes in today for initial evaluation.  No fever shakes chills.  No abdominal pain noted.  No chest pain shortness of breath.  No red flag signs or symptoms.  She reports that she is only had one other UTI in her history and that was years ago.  She has had a history of renal stones and had a urological procedure done by Dr. Eliberto Ivory in the past.  No other issues or problems are offered.     Past Medical History:  Diagnosis Date  . Chronic kidney disease H/O   stones  . History of chicken pox   . Multiple myeloma (HCC)    s/p stem cell transplant (08/2010)    Patient Active Problem List   Diagnosis Date Noted  . Hand pain, left 10/17/2019  . H/O bilateral breast implants 10/10/2018  . Tinnitus 10/10/2018  . Abdominal pain 01/26/2017  . Anemia 01/26/2017  . Tachycardia 04/21/2016  . Thyroid fullness 04/15/2016  . Hypercholesterolemia 04/15/2016  . Health care maintenance 11/14/2015  . Multiple myeloma in remission ([place]) 03/23/2015  . Toenail fungus 10/24/2013  . Numbness of toes 10/22/2013  . Shoulder pain, left 04/27/2013  . Elbow pain, right 04/27/2013  . Heel pain 04/27/2013  . Cat scratch 04/27/2013  . Leg skin lesion, left 04/27/2013  . Multiple myeloma ([place]) 01/04/2013    Past Surgical History:  Procedure Laterality Date  . APPENDECTOMY  03/2015  . APPENDECTOMY      OB History    Gravida  2   Para  2   Term      Preterm      AB       Living        SAB      IAB      Ectopic      Multiple      Live Births               Home Medications    Prior to Admission medications   Medication Sig Start Date End Date Taking? Authorizing Provider  nitrofurantoin, macrocrystal-monohydrate, (MACROBID) 100 MG capsule Take 1 capsule (100 mg total) by mouth 2 (two) times daily. 05/23/20  Yes Verda Cumins, MD  albuterol (VENTOLIN HFA) 108 (90 Base) MCG/ACT inhaler Inhale 2 puffs into the lungs every 4 (four) hours as needed for wheezing or shortness of breath (cough). 03/10/20   Verda Cumins, MD  amoxicillin-clavulanate (AUGMENTIN) 875-125 MG tablet Take 1 tablet by mouth every 12 (twelve) hours. 03/10/20   Verda Cumins, MD  benzonatate (TESSALON) 100 MG capsule Take 1 capsule (100 mg total) by mouth every 8 (eight) hours. 03/10/20   Verda Cumins, MD  methylPREDNISolone (MEDROL DOSEPAK) 4 MG TBPK tablet Take as directed per pack starting 8/28 11/12/19   Rodriguez-Southworth, Sunday Spillers, PA-C  rosuvastatin (CRESTOR) 5 MG tablet Take 1 tablet (5 mg total) by mouth daily. 01/18/20   Einar Pheasant, MD  Family History Family History  Problem Relation Age of Onset  . Mental illness Mother   . Hypercholesterolemia Sister   . Colon cancer Neg Hx   . Breast cancer Neg Hx     Social History Social History   Tobacco Use  . Smoking status: Never Smoker  . Smokeless tobacco: Never Used  Vaping Use  . Vaping Use: Never used  Substance Use Topics  . Alcohol use: No  . Drug use: No     Allergies   Revlimid [lenalidomide] and Pomalidomide   Review of Systems Review of Systems  Constitutional: Negative for activity change, appetite change, chills, diaphoresis, fatigue and fever.  HENT: Negative.  Negative for congestion, ear discharge and ear pain.   Eyes: Negative.   Respiratory: Negative.  Negative for cough and shortness of breath.   Cardiovascular: Negative.  Negative for chest pain and palpitations.   Gastrointestinal: Negative.  Negative for abdominal pain, constipation, diarrhea, nausea and vomiting.  Genitourinary: Positive for decreased urine volume, dysuria, frequency and urgency. Negative for flank pain, hematuria, pelvic pain, vaginal bleeding, vaginal discharge and vaginal pain.  Musculoskeletal: Negative.  Negative for myalgias.  Skin: Negative.  Negative for rash.  Neurological: Negative.   All other systems reviewed and are negative.    Physical Exam Triage Vital Signs ED Triage Vitals  Enc Vitals Group     BP 05/23/20 1152 132/89     Pulse Rate 05/23/20 1152 76     Resp 05/23/20 1152 18     Temp 05/23/20 1152 97.7 F (36.5 C)     Temp Source 05/23/20 1152 Oral     SpO2 05/23/20 1152 100 %     Weight 05/23/20 1150 162 lb 0.6 oz (73.5 kg)     Height 05/23/20 1150 '5\' 8"'  (1.727 m)     Head Circumference --      Peak Flow --      Pain Score 05/23/20 1149 9     Pain Loc --      Pain Edu? --      Excl. in [place]? --    No data found.  Updated Vital Signs BP 132/89 (BP Location: Left Arm)   Pulse 76   Temp 97.7 F (36.5 C) (Oral)   Resp 18   Ht '5\' 8"'  (1.727 m)   Wt 73.5 kg   SpO2 100%   BMI 24.64 kg/m   Visual Acuity Right Eye Distance:   Left Eye Distance:   Bilateral Distance:    Right Eye Near:   Left Eye Near:    Bilateral Near:     Physical Exam Vitals and nursing note reviewed.  Constitutional:      General: She is not in acute distress.    Appearance: Normal appearance. She is not ill-appearing, toxic-appearing or diaphoretic.  HENT:     Head: Normocephalic and atraumatic.     Mouth/Throat:     Mouth: Mucous membranes are moist.     Pharynx: No oropharyngeal exudate or posterior oropharyngeal erythema.  Eyes:     Extraocular Movements: Extraocular movements intact.     Conjunctiva/sclera: Conjunctivae normal.     Pupils: Pupils are equal, round, and reactive to light.  Cardiovascular:     Rate and Rhythm: Normal rate.     Pulses: Normal  pulses.     Heart sounds: Normal heart sounds. No murmur heard. No friction rub. No gallop.   Pulmonary:     Effort: Pulmonary effort is normal. No respiratory distress.  Breath sounds: Normal breath sounds. No stridor. No wheezing, rhonchi or rales.  Abdominal:     General: Bowel sounds are normal. There is no distension.     Palpations: Abdomen is soft.     Tenderness: There is no abdominal tenderness. There is no right CVA tenderness, left CVA tenderness, guarding or rebound.  Musculoskeletal:     Cervical back: Normal range of motion and neck supple.  Skin:    General: Skin is warm and dry.     Capillary Refill: Capillary refill takes less than 2 seconds.     Findings: No rash.  Neurological:     General: No focal deficit present.     Mental Status: She is alert and oriented to person, place, and time.      UC Treatments / Results  Labs (all labs ordered are listed, but only abnormal results are displayed) Labs Reviewed  URINE CULTURE - Abnormal; Notable for the following components:      Result Value   Culture >=100,000 COLONIES/mL ESCHERICHIA COLI (*)    Organism ID, Bacteria ESCHERICHIA COLI (*)    All other components within normal limits  URINALYSIS, COMPLETE (UACMP) WITH MICROSCOPIC - Abnormal; Notable for the following components:   APPearance CLOUDY (*)    Hgb urine dipstick MODERATE (*)    Protein, ur 30 (*)    Nitrite POSITIVE (*)    Leukocytes,Ua LARGE (*)    Bacteria, UA MANY (*)    All other components within normal limits    EKG   Radiology No results found.  Procedures Procedures (including critical care time)  Medications Ordered in UC Medications - No data to display  Initial Impression / Assessment and Plan / UC Course  I have reviewed the triage vital signs and the nursing notes.  Pertinent labs & imaging results that were available during my care of the patient were reviewed by me and considered in my medical decision making (see  chart for details).  Clinical impression: 2 weeks of UTI symptoms including burning on urination, increased frequency, increased urgency, and decreased volume with each void.  Treatment plan: 1.  The findings and treatment plan were discussed in detail with the patient.  Patient was in agreement. 2.  Recommended getting a UA.  Results above.  It shows leukocytes, nitrites, and bacteria.  Consistent with a UTI.  We will send off the culture. 3.  We will start her on Macrobid.  Pending culture results this antibiotic may be changed.  I informed her of that. 4.  Educational handouts provided. 5.  I have asked her to flush her system with plenty of water. 6.  Over-the-counter medicines including Azo.  She can also use cranberry juice. 7.  Over-the-counter medicines including Tylenol or Motrin for any discomfort. 8.  If she develops worsening symptoms, flank pain, significant fevers, abdominal pain then she should go to the ER as she may be developing a ascending infection.  She voiced verbal understanding. 9.  Follow-up here as needed.   Final Clinical Impressions(s) / UC Diagnoses   Final diagnoses:  Lower urinary tract infectious disease  Dysuria  Urinary frequency  Urinary urgency     Discharge Instructions     Your urine shows that you have a urinary tract infection. I sent off your urine culture.  Someone may contact you and change her antibiotic if the one that I chose today is resistant. Please see educational handout. Please hydrate and flush her system with at least 8  glasses of water a day. You can also use cranberry juice and/or Azo for your symptoms over-the-counter. You can also use Tylenol and/or Motrin for any discomfort.  I hope you get to feeling better, Dr. Drema Dallas    ED Prescriptions    Medication Sig Dispense Auth. Provider   nitrofurantoin, macrocrystal-monohydrate, (MACROBID) 100 MG capsule Take 1 capsule (100 mg total) by mouth 2 (two) times daily. 10  capsule Verda Cumins, MD     PDMP not reviewed this encounter.   Verda Cumins, MD 05/25/20 [phone]

## 2020-05-23 NOTE — Discharge Instructions (Signed)
Your urine shows that you have a urinary tract infection. I sent off your urine culture.  Someone may contact you and change her antibiotic if the one that I chose today is resistant. Please see educational handout. Please hydrate and flush her system with at least 8 glasses of water a day. You can also use cranberry juice and/or Azo for your symptoms over-the-counter. You can also use Tylenol and/or Motrin for any discomfort.  I hope you get to feeling better, Dr. Drema Dallas

## 2020-05-25 LAB — URINE CULTURE: Culture: >=100000 — AB

## 2020-06-13 DIAGNOSIS — T451X5A Adverse effect of antineoplastic and immunosuppressive drugs, initial encounter: Secondary | ICD-10-CM | POA: Diagnosis not present

## 2020-06-13 DIAGNOSIS — Z79899 Other long term (current) drug therapy: Secondary | ICD-10-CM | POA: Diagnosis not present

## 2020-06-13 DIAGNOSIS — Z9221 Personal history of antineoplastic chemotherapy: Secondary | ICD-10-CM | POA: Diagnosis not present

## 2020-06-13 DIAGNOSIS — G629 Polyneuropathy, unspecified: Secondary | ICD-10-CM | POA: Diagnosis not present

## 2020-06-13 DIAGNOSIS — C9002 Multiple myeloma in relapse: Secondary | ICD-10-CM | POA: Diagnosis not present

## 2020-06-13 DIAGNOSIS — Z9484 Stem cells transplant status: Secondary | ICD-10-CM | POA: Diagnosis not present

## 2020-06-13 DIAGNOSIS — R21 Rash and other nonspecific skin eruption: Secondary | ICD-10-CM | POA: Diagnosis not present

## 2020-06-13 DIAGNOSIS — C9000 Multiple myeloma not having achieved remission: Secondary | ICD-10-CM | POA: Diagnosis not present

## 2020-06-13 DIAGNOSIS — M722 Plantar fascial fibromatosis: Secondary | ICD-10-CM | POA: Diagnosis not present

## 2020-06-13 DIAGNOSIS — Z6826 Body mass index (BMI) 26.0-26.9, adult: Secondary | ICD-10-CM | POA: Diagnosis not present

## 2020-07-04 DIAGNOSIS — L821 Other seborrheic keratosis: Secondary | ICD-10-CM | POA: Diagnosis not present

## 2020-07-18 DIAGNOSIS — L578 Other skin changes due to chronic exposure to nonionizing radiation: Secondary | ICD-10-CM | POA: Diagnosis not present

## 2020-07-18 DIAGNOSIS — L570 Actinic keratosis: Secondary | ICD-10-CM | POA: Diagnosis not present

## 2020-07-18 DIAGNOSIS — L821 Other seborrheic keratosis: Secondary | ICD-10-CM | POA: Diagnosis not present

## 2020-08-17 DIAGNOSIS — U071 COVID-19: Secondary | ICD-10-CM | POA: Diagnosis not present

## 2020-09-05 DIAGNOSIS — L270 Generalized skin eruption due to drugs and medicaments taken internally: Secondary | ICD-10-CM | POA: Diagnosis not present

## 2020-09-05 DIAGNOSIS — T451X5A Adverse effect of antineoplastic and immunosuppressive drugs, initial encounter: Secondary | ICD-10-CM | POA: Diagnosis not present

## 2020-09-05 DIAGNOSIS — G620 Drug-induced polyneuropathy: Secondary | ICD-10-CM | POA: Diagnosis not present

## 2020-09-05 DIAGNOSIS — C9000 Multiple myeloma not having achieved remission: Secondary | ICD-10-CM | POA: Diagnosis not present

## 2020-09-05 DIAGNOSIS — Z6825 Body mass index (BMI) 25.0-25.9, adult: Secondary | ICD-10-CM | POA: Diagnosis not present

## 2020-09-17 ENCOUNTER — Ambulatory Visit: Payer: Self-pay

## 2020-09-26 ENCOUNTER — Ambulatory Visit (INDEPENDENT_AMBULATORY_CARE_PROVIDER_SITE_OTHER): Payer: Medicare Other

## 2020-09-26 ENCOUNTER — Other Ambulatory Visit: Payer: Self-pay

## 2020-09-26 VITALS — Ht 68.0 in | Wt 162.0 lb

## 2020-09-26 DIAGNOSIS — Z1211 Encounter for screening for malignant neoplasm of colon: Secondary | ICD-10-CM | POA: Diagnosis not present

## 2020-09-26 DIAGNOSIS — Z0000 Encounter for general adult medical examination without abnormal findings: Secondary | ICD-10-CM

## 2020-09-26 NOTE — Patient Instructions (Addendum)
April Chapman , Thank you for taking time to come for your Medicare Wellness Visit. I appreciate your ongoing commitment to your health goals. Please review the following plan we discussed and let me know if I can assist you in the future.   These are the goals we discussed:  Goals       Patient Stated     Follow up with Primary Care Provider (pt-stated)      As needed         This is a list of the screening recommended for you and due dates:  Health Maintenance  Topic Date Due   Cologuard (Stool DNA test)  05/21/2019   Flu Shot  10/16/2020   Mammogram  04/07/2021   Tetanus Vaccine  10/30/2029   DEXA scan (bone density measurement)  Completed   Hepatitis C Screening: USPSTF Recommendation to screen - Ages 53-79 yo.  Completed   Pneumonia vaccines  Completed   Zoster (Shingles) Vaccine  Completed   HPV Vaccine  Aged Out   COVID-19 Vaccine  Discontinued    Advanced directives: End of life planning; Advance aging; Advanced directives discussed.  Copy of current HCPOA/Living Will requested.    Conditions/risks identified: none new  Follow up in one year for your annual wellness visit    Preventive Care 65 Years and Older, Female Preventive care refers to lifestyle choices and visits with your health care provider that can promote health and wellness. What does preventive care include? A yearly physical exam. This is also called an annual well check. Dental exams once or twice a year. Routine eye exams. Ask your health care provider how often you should have your eyes checked. Personal lifestyle choices, including: Daily care of your teeth and gums. Regular physical activity. Eating a healthy diet. Avoiding tobacco and drug use. Limiting alcohol use. Practicing safe sex. Taking low-dose aspirin every day. Taking vitamin and mineral supplements as recommended by your health care provider. What happens during an annual well check? The services and screenings done by your  health care provider during your annual well check will depend on your age, overall health, lifestyle risk factors, and family history of disease. Counseling  Your health care provider may ask you questions about your: Alcohol use. Tobacco use. Drug use. Emotional well-being. Home and relationship well-being. Sexual activity. Eating habits. History of falls. Memory and ability to understand (cognition). Work and work Statistician. Reproductive health. Screening  You may have the following tests or measurements: Height, weight, and BMI. Blood pressure. Lipid and cholesterol levels. These may be checked every 5 years, or more frequently if you are over 42 years old. Skin check. Lung cancer screening. You may have this screening every year starting at age 56 if you have a 30-pack-year history of smoking and currently smoke or have quit within the past 15 years. Fecal occult blood test (FOBT) of the stool. You may have this test every year starting at age 64. Flexible sigmoidoscopy or colonoscopy. You may have a sigmoidoscopy every 5 years or a colonoscopy every 10 years starting at age 44. Hepatitis C blood test. Hepatitis B blood test. Sexually transmitted disease (STD) testing. Diabetes screening. This is done by checking your blood sugar (glucose) after you have not eaten for a while (fasting). You may have this done every 1-3 years. Bone density scan. This is done to screen for osteoporosis. You may have this done starting at age 87. Mammogram. This may be done every 1-2 years. Talk to  your health care provider about how often you should have regular mammograms. Talk with your health care provider about your test results, treatment options, and if necessary, the need for more tests. Vaccines  Your health care provider may recommend certain vaccines, such as: Influenza vaccine. This is recommended every year. Tetanus, diphtheria, and acellular pertussis (Tdap, Td) vaccine. You may  need a Td booster every 10 years. Zoster vaccine. You may need this after age 76. Pneumococcal 13-valent conjugate (PCV13) vaccine. One dose is recommended after age 37. Pneumococcal polysaccharide (PPSV23) vaccine. One dose is recommended after age 66. Talk to your health care provider about which screenings and vaccines you need and how often you need them. This information is not intended to replace advice given to you by your health care provider. Make sure you discuss any questions you have with your health care provider. Document Released: 03/31/2015 Document Revised: 11/22/2015 Document Reviewed: 01/03/2015 Elsevier Interactive Patient Education  2017 [place] Prevention in the Home Falls can cause injuries. They can happen to people of all ages. There are many things you can do to make your home safe and to help prevent falls. What can I do on the outside of my home? Regularly fix the edges of walkways and driveways and fix any cracks. Remove anything that might make you trip as you walk through a door, such as a raised step or threshold. Trim any bushes or trees on the path to your home. Use bright outdoor lighting. Clear any walking paths of anything that might make someone trip, such as rocks or tools. Regularly check to see if handrails are loose or broken. Make sure that both sides of any steps have handrails. Any raised decks and porches should have guardrails on the edges. Have any leaves, snow, or ice cleared regularly. Use sand or salt on walking paths during winter. Clean up any spills in your garage right away. This includes oil or grease spills. What can I do in the bathroom? Use night lights. Install grab bars by the toilet and in the tub and shower. Do not use towel bars as grab bars. Use non-skid mats or decals in the tub or shower. If you need to sit down in the shower, use a plastic, non-slip stool. Keep the floor dry. Clean up any water that spills on  the floor as soon as it happens. Remove soap buildup in the tub or shower regularly. Attach bath mats securely with double-sided non-slip rug tape. Do not have throw rugs and other things on the floor that can make you trip. What can I do in the bedroom? Use night lights. Make sure that you have a light by your bed that is easy to reach. Do not use any sheets or blankets that are too big for your bed. They should not hang down onto the floor. Have a firm chair that has side arms. You can use this for support while you get dressed. Do not have throw rugs and other things on the floor that can make you trip. What can I do in the kitchen? Clean up any spills right away. Avoid walking on wet floors. Keep items that you use a lot in easy-to-reach places. If you need to reach something above you, use a strong step stool that has a grab bar. Keep electrical cords out of the way. Do not use floor polish or wax that makes floors slippery. If you must use wax, use non-skid floor wax. Do not  have throw rugs and other things on the floor that can make you trip. What can I do with my stairs? Do not leave any items on the stairs. Make sure that there are handrails on both sides of the stairs and use them. Fix handrails that are broken or loose. Make sure that handrails are as long as the stairways. Check any carpeting to make sure that it is firmly attached to the stairs. Fix any carpet that is loose or worn. Avoid having throw rugs at the top or bottom of the stairs. If you do have throw rugs, attach them to the floor with carpet tape. Make sure that you have a light switch at the top of the stairs and the bottom of the stairs. If you do not have them, ask someone to add them for you. What else can I do to help prevent falls? Wear shoes that: Do not have high heels. Have rubber bottoms. Are comfortable and fit you well. Are closed at the toe. Do not wear sandals. If you use a stepladder: Make sure  that it is fully opened. Do not climb a closed stepladder. Make sure that both sides of the stepladder are locked into place. Ask someone to hold it for you, if possible. Clearly mark and make sure that you can see: Any grab bars or handrails. First and last steps. Where the edge of each step is. Use tools that help you move around (mobility aids) if they are needed. These include: Canes. Walkers. Scooters. Crutches. Turn on the lights when you go into a dark area. Replace any light bulbs as soon as they burn out. Set up your furniture so you have a clear path. Avoid moving your furniture around. If any of your floors are uneven, fix them. If there are any pets around you, be aware of where they are. Review your medicines with your doctor. Some medicines can make you feel dizzy. This can increase your chance of falling. Ask your doctor what other things that you can do to help prevent falls. This information is not intended to replace advice given to you by your health care provider. Make sure you discuss any questions you have with your health care provider. Document Released: 12/29/2008 Document Revised: 08/10/2015 Document Reviewed: 04/08/2014 Elsevier Interactive Patient Education  2017 [employer].

## 2020-09-26 NOTE — Progress Notes (Signed)
Subjective:   April Chapman is a 70 y.o. female who presents for Medicare Annual (Subsequent) preventive examination.  Review of Systems    No ROS.  Medicare Wellness Virtual Visit.  Visual/audio telehealth visit, UTA vital signs.   See social history for additional risk factors.   Cardiac Risk Factors include: advanced age (>76mn, >>59women)     Objective:    Today's Vitals   09/26/20 1042  Weight: 162 lb (73.5 kg)  Height: _0  (1.727 m)   Body mass index is 24.63 kg/m.  Advanced Directives 09/26/2020 05/23/2020 03/10/2020 11/12/2019 09/24/2019 09/23/2018 03/19/2018  Does Patient Have a Medical Advance Directive? Yes No No No Yes Yes No  Type of AParamedicof [place] will - - - [place] will [place] will -  Does patient want to make changes to medical advance directive? No - Patient declined - - - No - Patient declined No - Patient declined -  Copy of [place] Chart? No - copy requested - - - No - copy requested No - copy requested -    Current Medications (verified) Outpatient Encounter Medications as of 09/26/2020  Medication Sig   rosuvastatin (CRESTOR) 5 MG tablet Take 1 tablet (5 mg total) by mouth daily.   [DISCONTINUED] albuterol (VENTOLIN HFA) 108 (90 Base) MCG/ACT inhaler Inhale 2 puffs into the lungs every 4 (four) hours as needed for wheezing or shortness of breath (cough).   [DISCONTINUED] amoxicillin-clavulanate (AUGMENTIN) 875-125 MG tablet Take 1 tablet by mouth every 12 (twelve) hours.   [DISCONTINUED] benzonatate (TESSALON) 100 MG capsule Take 1 capsule (100 mg total) by mouth every 8 (eight) hours.   [DISCONTINUED] methylPREDNISolone (MEDROL DOSEPAK) 4 MG TBPK tablet Take as directed per pack starting 8/28   [DISCONTINUED] nitrofurantoin, macrocrystal-monohydrate, (MACROBID) 100 MG capsule Take 1 capsule (100 mg total) by mouth 2 (two) times daily.   No  facility-administered encounter medications on file as of 09/26/2020.    Allergies (verified) Revlimid [lenalidomide] and Pomalidomide   History: Past Medical History:  Diagnosis Date   Chronic kidney disease H/O   stones   History of chicken pox    Multiple myeloma (HCC)    s/p stem cell transplant (08/2010)   Past Surgical History:  Procedure Laterality Date   APPENDECTOMY  03/2015   APPENDECTOMY     Family History  Problem Relation Age of Onset   Mental illness Mother    Hypercholesterolemia Sister    Colon cancer Neg Hx    Breast cancer Neg Hx    Social History   Socioeconomic History   Marital status: Widowed    Spouse name: Not on file   Number of children: 2   Years of education: Not on file   Highest education level: Not on file  Occupational History   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[place]  Tobacco Use   Smoking status: Never   Smokeless tobacco: Never  Vaping Use   Vaping Use: Never used  Substance and Sexual Activity   Alcohol use: No   Drug use: No   Sexual activity: Never  Other Topics Concern   Not on file  Social History Narrative   Not on file   Social Determinants of Health   Financial Resource Strain: Low Risk    Difficulty of Paying Living Expenses: Not hard at all  Food Insecurity: No Food Insecurity   Worried About RCharity fundraiser  in the Last Year: Never true   [place] in the Last Year: Never true  Transportation Needs: No Transportation Needs   Lack of Transportation (Medical): No   Lack of Transportation (Non-Medical): No  Physical Activity: Insufficiently Active   Days of Exercise per Week: 4 days   Minutes of Exercise per Session: 20 min  Stress: No Stress Concern Present   Feeling of Stress : Not at all  Social Connections: Unknown   Frequency of Communication with Friends and Family: More than three times a week   Frequency of Social Gatherings with Friends and Family: More than three times a week    Attends Religious Services: Not on file   Active Member of Clubs or Organizations: Yes   Attends Archivist Meetings: Not on file   Marital Status: Widowed    Tobacco Counseling Counseling given: Not Answered   Clinical Intake:  Pre-visit preparation completed: Yes        Diabetes: No  How often do you need to have someone help you when you read instructions, pamphlets, or other written materials from your doctor or pharmacy?: 1 - Never  Interpreter Needed?: No      Activities of Daily Living In your present state of health, do you have any difficulty performing the following activities: 09/26/2020  Hearing? N  Vision? N  Difficulty concentrating or making decisions? N  Walking or climbing stairs? N  Dressing or bathing? N  Doing errands, shopping? N  Preparing Food and eating ? N  Using the Toilet? N  In the past six months, have you accidently leaked urine? N  Do you have problems with loss of bowel control? N  Managing your Medications? N  Managing your Finances? N  Housekeeping or managing your Housekeeping? N  Some recent data might be hidden    Patient Care Team: Einar Pheasant, MD as PCP - General (Internal Medicine)  Indicate any recent Medical Services you may have received from other than [facility] providers in the past year (date may be approximate).     Assessment:   This is a routine wellness examination for April Chapman.  I connected with April Chapman today by telephone and verified that I am speaking with the correct person using two identifiers. Location patient: home Location provider: work Persons participating in the virtual visit: patient, Marine scientist.    I discussed the limitations, risks, security and privacy concerns of performing an evaluation and management service by telephone and the availability of in person appointments.  The patient expressed understanding and verbally consented to this telephonic visit.    Interactive audio and video  telecommunications were attempted between this provider and patient, however failed, due to patient having technical difficulties OR patient did not have access to video capability.  We continued and completed visit with audio only.  Some vital signs may be absent or patient reported.   Hearing/Vision screen Hearing Screening - Comments:: Patient is able to hear conversational tones without difficulty.  No issues reported.   Vision Screening - Comments:: Followed by Dr. Ellin Mayhew They have seen their ophthalmologist in the last 12 months.    Dietary issues and exercise activities discussed: Current Exercise Habits: Home exercise routine, Time (Minutes): 20, Frequency (Times/Week): 4, Weekly Exercise (Minutes/Week): 80, Intensity: Mild Healthy diet Good water intake   Goals Addressed               This Visit's Progress     Patient Stated  Follow up with Primary Care Provider (pt-stated)        As needed        Depression Screen PHQ 2/9 Scores 09/26/2020 09/24/2019 04/12/2019 09/23/2018 07/02/2017 06/04/2016 11/13/2015  PHQ - 2 Score 0 0 0 0 0 0 0    Fall Risk Fall Risk  09/26/2020 09/24/2019 04/12/2019 09/23/2018 07/02/2017  Falls in the past year? 0 0 0 0 No  Number falls in past yr: 0 0 - - -  Injury with Fall? 0 - - - -  Follow up Falls evaluation completed Falls evaluation completed - - -   ASSISTIVE DEVICES UTILIZED TO PREVENT FALLS: Life alert? No  Use of a cane, walker or w/c? No   TIMED UP AND GO: Was the test performed? No .   Cognitive Function: Patient is alert and oriented x3.  Denies difficulty focusing, making decisions, memory loss.  MMSE/6CIT deferred. Normal by direct communication/observation.  MMSE - Mini Mental State Exam 09/24/2019  Not completed: Unable to complete     6CIT Screen 09/23/2018  What Year? 0 points  What month? 0 points  What time? 0 points  Count back from 20 0 points  Months in reverse 0 points  Repeat phrase 0 points  Total Score 0     Immunizations Immunization History  Administered Date(s) Administered   DTaP 05/29/2011, 08/15/2011, 12/06/2011, 02/20/2012   DTaP / Hep B / IPV 05/15/2017, 11/06/2017, 10/01/2018   Hepatitis B, adult 05/29/2011, 08/15/2011, 12/06/2011   HiB (PRP-T) 05/15/2017, 11/06/2017, 10/01/2018   IPV 05/29/2011, 08/15/2011, 12/06/2011   Influenza,inj,Quad PF,6+ Mos 03/23/2015   MMR 03/23/2015, 04/08/2019   Pneumococcal Conjugate-13 05/29/2011, 08/15/2011, 12/06/2011, 02/20/2012, 05/15/2017, 11/06/2017, 10/01/2018   Pneumococcal Polysaccharide-23 03/23/2015, 04/08/2019   Tdap 10/31/2019   Zoster Recombinat (Shingrix) 05/15/2017, 11/06/2017    Health Maintenance Health Maintenance  Topic Date Due   Fecal DNA (Cologuard)  05/21/2019   INFLUENZA VACCINE  10/16/2020   MAMMOGRAM  04/07/2021   TETANUS/TDAP  10/30/2029   DEXA SCAN  Completed   Hepatitis C Screening  Completed   PNA vac Low Risk Adult  Completed   Zoster Vaccines- Shingrix  Completed   HPV VACCINES  Aged Out   COVID-19 Vaccine  Discontinued   Cologuard- ordered per consent.   Lung Cancer Screening: (Low Dose CT Chest recommended if Age 19-80 years, 30 pack-year currently smoking OR have quit w/in 15years.) does not qualify.   Vision Screening: Recommended annual ophthalmology exams for early detection of glaucoma and other disorders of the eye. Is the patient up to date with their annual eye exam?  Yes   Dental Screening: Recommended annual dental exams for proper oral hygiene.  Community Resource Referral / Chronic Care Management: CRR required this visit?  No   CCM required this visit?  No      Plan:   Keep all routine maintenance appointments.   I have personally reviewed and noted the following in the patient's chart:   Medical and social history Use of alcohol, tobacco or illicit drugs  Current medications and supplements including opioid prescriptions. Patient is not currently taking opioid.  Functional  ability and status Nutritional status Physical activity Advanced directives List of other physicians Hospitalizations, surgeries, and ER visits in previous 12 months Vitals Screenings to include cognitive, depression, and falls Referrals and appointments  In addition, I have reviewed and discussed with patient certain preventive protocols, quality metrics, and best practice recommendations. A written personalized care plan for preventive services as well  as general preventive health recommendations were provided to patient.     Varney Biles, LPN   09/26/7869

## 2020-09-29 DIAGNOSIS — Z008 Encounter for other general examination: Secondary | ICD-10-CM | POA: Diagnosis not present

## 2020-09-29 DIAGNOSIS — D704 Cyclic neutropenia: Secondary | ICD-10-CM | POA: Diagnosis not present

## 2020-09-29 DIAGNOSIS — C9000 Multiple myeloma not having achieved remission: Secondary | ICD-10-CM | POA: Diagnosis not present

## 2020-10-25 ENCOUNTER — Other Ambulatory Visit: Payer: Self-pay

## 2020-10-25 ENCOUNTER — Ambulatory Visit
Admission: RE | Admit: 2020-10-25 | Discharge: 2020-10-25 | Disposition: A | Discharge status: Home / Self Care | Payer: Medicare Other | Source: Ambulatory Visit | Attending: Physician Assistant | Admitting: Physician Assistant

## 2020-10-25 ENCOUNTER — Ambulatory Visit (INDEPENDENT_AMBULATORY_CARE_PROVIDER_SITE_OTHER): Payer: Medicare Other

## 2020-10-25 VITALS — BP 111/47 | HR 76 | Temp 98.2°F | Resp 24

## 2020-10-25 DIAGNOSIS — R509 Fever, unspecified: Secondary | ICD-10-CM | POA: Diagnosis not present

## 2020-10-25 DIAGNOSIS — R062 Wheezing: Secondary | ICD-10-CM | POA: Diagnosis not present

## 2020-10-25 DIAGNOSIS — R059 Cough, unspecified: Secondary | ICD-10-CM | POA: Insufficient documentation

## 2020-10-25 DIAGNOSIS — J988 Other specified respiratory disorders: Secondary | ICD-10-CM | POA: Insufficient documentation

## 2020-10-25 DIAGNOSIS — Z20822 Contact with and (suspected) exposure to covid-19: Secondary | ICD-10-CM | POA: Diagnosis not present

## 2020-10-25 MED ORDER — DOXYCYCLINE HYCLATE 100 MG PO CAPS: 100.0000 mg | ORAL_CAPSULE | Freq: Two times a day (BID) | ORAL | 0 refills | Status: AC

## 2020-10-25 MED ORDER — ALBUTEROL SULFATE HFA 108 (90 BASE) MCG/ACT IN AERS: 1.0000 | INHALATION_SPRAY | Freq: Four times a day (QID) | RESPIRATORY_TRACT | 0 refills | Status: DC | PRN

## 2020-10-25 MED ORDER — CHERATUSSIN AC 100-10 MG/5ML PO SOLN: 10.0000 mL | Freq: Three times a day (TID) | ORAL | 0 refills | Status: DC | PRN

## 2020-10-25 NOTE — ED Triage Notes (Signed)
Patient reports she started feeling bad one week ago 10/16/2020.  Initially thought this was a summer cold, now patient is feeling much worse.  Patient has a cough and yellow/green phlegm.  Reports fever of 102 today.

## 2020-10-25 NOTE — ED Provider Notes (Signed)
[facility]    CSN: [id number] Arrival date & time: 10/25/20  7829      History   Chief Complaint Chief Complaint  Patient presents with   URI   Appointment    7:00    HPI April Chapman is a 70 y.o. female presenting for 10-day history of cough and nasal congestion. Patient admits to new onset fever and yellow/green sputum production today.  Patient also admits to wheezing especially at night and occasionally feeling short of breath when she lays down.  She is fatigued as well.  Denies any body aches, chest pain, sinus pain or pressure, sore throat.  No abdominal pain, nausea/vomiting or diarrhea.  Patient was in contact with a sick grandchild at onset of symptoms but denies any known COVID exposure.  She has not been vaccinated for COVID-19.  She has not been taking any medication for symptoms.  PMH significant for Multiple Myeloma with history of relapse.  No history of cardiopulmonary disease.  HPI  Past Medical History:  Diagnosis Date   Chronic kidney disease H/O   stones   History of chicken pox    Multiple myeloma ([place])    s/p stem cell transplant (08/2010)    Patient Active Problem List   Diagnosis Date Noted   Hand pain, left 10/17/2019   H/O bilateral breast implants 10/10/2018   Tinnitus 10/10/2018   Abdominal pain 01/26/2017   Anemia 01/26/2017   Tachycardia 04/21/2016   Thyroid fullness 04/15/2016   Hypercholesterolemia 04/15/2016   Health care maintenance 11/14/2015   Multiple myeloma in remission ([place]) 03/23/2015   Toenail fungus 10/24/2013   Numbness of toes 10/22/2013   Shoulder pain, left 04/27/2013   Elbow pain, right 04/27/2013   Heel pain 04/27/2013   Cat scratch 04/27/2013   Leg skin lesion, left 04/27/2013   Multiple myeloma ([place]) 01/04/2013    Past Surgical History:  Procedure Laterality Date   APPENDECTOMY  03/2015   APPENDECTOMY      OB History     Gravida  2   Para  2   Term      Preterm      AB       Living         SAB      IAB      Ectopic      Multiple      Live Births               Home Medications    Prior to Admission medications   Medication Sig Start Date End Date Taking? Authorizing Provider  albuterol (VENTOLIN HFA) 108 (90 Base) MCG/ACT inhaler Inhale 1-2 puffs into the lungs every 6 (six) hours as needed for up to 10 days for wheezing or shortness of breath. 10/25/20 11/04/20 Yes Danton Clap, PA-C  doxycycline (VIBRAMYCIN) 100 MG capsule Take 1 capsule (100 mg total) by mouth 2 (two) times daily for 7 days. 10/25/20 11/01/20 Yes Danton Clap, PA-C  guaiFENesin-codeine (CHERATUSSIN AC) 100-10 MG/5ML syrup Take 10 mLs by mouth 3 (three) times daily as needed for cough. 10/25/20  Yes Laurene Footman B, PA-C  ibuprofen (ADVIL) 200 MG tablet Take 200 mg by mouth every 6 (six) hours as needed.   Yes [provider]  rosuvastatin (CRESTOR) 5 MG tablet Take 1 tablet (5 mg total) by mouth daily. 01/18/20  Yes Einar Pheasant, MD    Family History Family History  Problem Relation Age of Onset  Mental illness Mother    Hypercholesterolemia Sister    Colon cancer Neg Hx    Breast cancer Neg Hx     Social History Social History   Tobacco Use   Smoking status: Never   Smokeless tobacco: Never  Vaping Use   Vaping Use: Never used  Substance Use Topics   Alcohol use: No   Drug use: No     Allergies   Revlimid [lenalidomide] and Pomalidomide   Review of Systems Review of Systems  Constitutional:  Positive for fatigue and fever. Negative for chills and diaphoresis.  HENT:  Positive for congestion and rhinorrhea. Negative for ear pain, sinus pressure, sinus pain and sore throat.   Respiratory:  Positive for cough, shortness of breath and wheezing.   Cardiovascular:  Negative for chest pain.  Gastrointestinal:  Negative for abdominal pain, nausea and vomiting.  Musculoskeletal:  Negative for arthralgias and myalgias.  Skin:  Negative for rash.   Neurological:  Negative for weakness and headaches.  Hematological:  Negative for adenopathy.    Physical Exam Triage Vital Signs ED Triage Vitals  Enc Vitals Group     BP 10/25/20 1835 (!) 111/47     Pulse Rate 10/25/20 1835 76     Resp 10/25/20 1835 (!) 24     Temp 10/25/20 1835 98.2 F (36.8 C)     Temp Source 10/25/20 1835 Oral     SpO2 10/25/20 1835 98 %     Weight --      Height --      Head Circumference --      Peak Flow --      Pain Score 10/25/20 1832 0     Pain Loc --      Pain Edu? --      Excl. in [place]? --    No data found.  Updated Vital Signs BP (!) 111/47 (BP Location: Left Arm)   Pulse 76   Temp 98.2 F (36.8 C) (Oral)   Resp (!) 24   SpO2 98%   Physical Exam Vitals and nursing note reviewed.  Constitutional:      General: She is not in acute distress.    Appearance: Normal appearance. She is ill-appearing. She is not toxic-appearing.  HENT:     Head: Normocephalic and atraumatic.     Nose: Congestion present.     Mouth/Throat:     Mouth: Mucous membranes are moist.     Pharynx: Oropharynx is clear.  Eyes:     General: No scleral icterus.       Right eye: No discharge.        Left eye: No discharge.     Conjunctiva/sclera: Conjunctivae normal.  Cardiovascular:     Rate and Rhythm: Normal rate and regular rhythm.     Heart sounds: Normal heart sounds.  Pulmonary:     Effort: Pulmonary effort is normal. No respiratory distress.     Breath sounds: Wheezing (diffusely throughout) and rhonchi (diffusely throughout) present. No rales.  Musculoskeletal:     Cervical back: Neck supple.  Skin:    General: Skin is dry.  Neurological:     General: No focal deficit present.     Mental Status: She is alert. Mental status is at baseline.     Motor: No weakness.     Gait: Gait normal.  Psychiatric:        Mood and Affect: Mood normal.        Behavior: Behavior normal.  Thought Content: Thought content normal.     UC Treatments / Results   Labs (all labs ordered are listed, but only abnormal results are displayed) Labs Reviewed  SARS CORONAVIRUS 2 (TAT 6-24 HRS)    EKG   Radiology DG Chest 2 View  Result Date: 10/25/2020 CLINICAL DATA:  Cough, fever EXAM: CHEST - 2 VIEW COMPARISON:  03/10/2020 FINDINGS: Heart and mediastinal contours are within normal limits. No focal opacities or effusions. No acute bony abnormality. Mild hyperinflation. IMPRESSION: No active cardiopulmonary disease. Electronically Signed   By: Rolm Baptise M.D.   On: 10/25/2020 19:10    Procedures Procedures (including critical care time)  Medications Ordered in UC Medications - No data to display  Initial Impression / Assessment and Plan / UC Course  I have reviewed the triage vital signs and the nursing notes.  Pertinent labs & imaging results that were available during my care of the patient were reviewed by me and considered in my medical decision making (see chart for details).  70 year old female with history of multiple myeloma presenting for 10-day history of cough and congestion with new onset of fever and yellowish-green sputum production.  Also has had wheezing and some shortness of breath when lying flat.  Vitals are stable.  Increased respiratory rate.  Oxygen saturation is 98%.  She is afebrile.  She does state that she has been taking antipyretics for the fever with the last dose a couple hours ago.  On exam she has nasal congestion.  She is ill-appearing but nontoxic.  She has diffuse wheezing and rhonchi throughout chest.  PCR COVID test obtained given new onset of fever.  Current CDC guidelines, isolation protocol and ED precautions reviewed with patient.  Chest x-ray obtained due to concern for possible underlying pneumonia.  I independently reviewed this.  Overread confirms no evidence of pneumonia.  Reviewed this with patient but I do have high suspicion for developing pneumonia given her clinical presentation.  I am treating her  with doxycycline and I have also sent in Cheratussin after reviewing controlled substance database.  Sent in albuterol inhaler as well.  Thoroughly reviewed ED red flag signs and symptoms relating to her diagnosis.   Final Clinical Impressions(s) / UC Diagnoses   Final diagnoses:  Respiratory infection  Cough  Wheezing     Discharge Instructions      Chest x-ray does not show pneumonia, but I have suspicion for developing pneumonia so I am treating you with antibiotics, cough medication and an inhaler. Please follow up with PCP if not feeling better next week or sooner for worsening symptoms. Call 911 or go to ED for severe acute worsening of symptoms, fever, breathing trouble  You have received COVID testing today either for positive exposure, concerning symptoms that could be related to COVID infection, screening purposes, or re-testing after confirmed positive.  Your test obtained today checks for active viral infection in the last 1-2 weeks. If your test is negative now, you can still test positive later. So, if you do develop symptoms you should either get re-tested and/or isolate x 5 days and then strict mask use x 5 days (unvaccinated) or mask use x 10 days (vaccinated). Please follow CDC guidelines.  While Rapid antigen tests come back in 15-20 minutes, send out PCR/molecular test results typically come back within 1-3 days. In the mean time, if you are symptomatic, assume this could be a positive test and treat/monitor yourself as if you do have COVID.   We  will call with test results if positive. Please download the MyChart app and set up a profile to access test results.   If symptomatic, go home and rest. Push fluids. Take Tylenol as needed for discomfort. Gargle warm salt water. Throat lozenges. Take Mucinex DM or Robitussin for cough. Humidifier in bedroom to ease coughing. Warm showers. Also review the COVID handout for more information.  COVID-19 INFECTION: The incubation  period of COVID-19 is approximately 14 days after exposure, with most symptoms developing in roughly 4-5 days. Symptoms may range in severity from mild to critically severe. Roughly 80% of those infected will have mild symptoms. People of any age may become infected with COVID-19 and have the ability to transmit the virus. The most common symptoms include: fever, fatigue, cough, body aches, headaches, sore throat, nasal congestion, shortness of breath, nausea, vomiting, diarrhea, changes in smell and/or taste.    COURSE OF ILLNESS Some patients may begin with mild disease which can progress quickly into critical symptoms. If your symptoms are worsening please call ahead to the Emergency Department and proceed there for further treatment. Recovery time appears to be roughly 1-2 weeks for mild symptoms and 3-6 weeks for severe disease.   GO IMMEDIATELY TO ER FOR FEVER YOU ARE UNABLE TO GET DOWN WITH TYLENOL, BREATHING PROBLEMS, CHEST PAIN, FATIGUE, LETHARGY, INABILITY TO EAT OR DRINK, ETC  QUARANTINE AND ISOLATION: To help decrease the spread of COVID-19 please remain isolated if you have COVID infection or are highly suspected to have COVID infection. This means -stay home and isolate to one room in the home if you live with others. Do not share a bed or bathroom with others while ill, sanitize and wipe down all countertops and keep common areas clean and disinfected. Stay home for 5 days. If you have no symptoms or your symptoms are resolving after 5 days, you can leave your house. Continue to wear a mask around others for 5 additional days. If you have been in close contact (within 6 feet) of someone diagnosed with COVID 19, you are advised to quarantine in your home for 14 days as symptoms can develop anywhere from 2-14 days after exposure to the virus. If you develop symptoms, you  must isolate.  Most current guidelines for COVID after exposure -unvaccinated: isolate 5 days and strict mask use x 5 days.  Test on day 5 is possible -vaccinated: wear mask x 10 days if symptoms do not develop -You do not necessarily need to be tested for COVID if you have + exposure and  develop symptoms. Just isolate at home x10 days from symptom onset During this global pandemic, CDC advises to practice social distancing, try to stay at least 8f away from others at all times. Wear a face covering. Wash and sanitize your hands regularly and avoid going anywhere that is not necessary.  KEEP IN MIND THAT THE COVID TEST IS NOT 100% ACCURATE AND YOU SHOULD STILL DO EVERYTHING TO PREVENT POTENTIAL SPREAD OF VIRUS TO OTHERS (WEAR MASK, WEAR GLOVES, WRegino RamirezHANDS AND SANITIZE REGULARLY). IF INITIAL TEST IS NEGATIVE, THIS MAY NOT MEAN YOU ARE DEFINITELY NEGATIVE. MOST ACCURATE TESTING IS DONE 5-7 DAYS AFTER EXPOSURE.   It is not advised by CDC to get re-tested after receiving a positive COVID test since you can still test positive for weeks to months after you have already cleared the virus.   *If you have not been vaccinated for COVID, I strongly suggest you consider getting vaccinated as long as  there are no contraindications.       ED Prescriptions     Medication Sig Dispense Auth. Provider   doxycycline (VIBRAMYCIN) 100 MG capsule Take 1 capsule (100 mg total) by mouth 2 (two) times daily for 7 days. 14 capsule Laurene Footman B, PA-C   albuterol (VENTOLIN HFA) 108 (90 Base) MCG/ACT inhaler Inhale 1-2 puffs into the lungs every 6 (six) hours as needed for up to 10 days for wheezing or shortness of breath. 1 g Laurene Footman B, PA-C   guaiFENesin-codeine (CHERATUSSIN AC) 100-10 MG/5ML syrup Take 10 mLs by mouth 3 (three) times daily as needed for cough. 120 mL Danton Clap, PA-C      PDMP not reviewed this encounter.   Danton Clap, PA-C 10/25/20 1936

## 2020-10-25 NOTE — Discharge Instructions (Addendum)
Chest x-ray does not show pneumonia, but I have suspicion for developing pneumonia so I am treating you with antibiotics, cough medication and an inhaler. Please follow up with PCP if not feeling better next week or sooner for worsening symptoms. Call 911 or go to ED for severe acute worsening of symptoms, fever, breathing trouble  You have received COVID testing today either for positive exposure, concerning symptoms that could be related to COVID infection, screening purposes, or re-testing after confirmed positive.  Your test obtained today checks for active viral infection in the last 1-2 weeks. If your test is negative now, you can still test positive later. So, if you do develop symptoms you should either get re-tested and/or isolate x 5 days and then strict mask use x 5 days (unvaccinated) or mask use x 10 days (vaccinated). Please follow CDC guidelines.  While Rapid antigen tests come back in 15-20 minutes, send out PCR/molecular test results typically come back within 1-3 days. In the mean time, if you are symptomatic, assume this could be a positive test and treat/monitor yourself as if you do have COVID.   We will call with test results if positive. Please download the MyChart app and set up a profile to access test results.   If symptomatic, go home and rest. Push fluids. Take Tylenol as needed for discomfort. Gargle warm salt water. Throat lozenges. Take Mucinex DM or Robitussin for cough. Humidifier in bedroom to ease coughing. Warm showers. Also review the COVID handout for more information.  COVID-19 INFECTION: The incubation period of COVID-19 is approximately 14 days after exposure, with most symptoms developing in roughly 4-5 days. Symptoms may range in severity from mild to critically severe. Roughly 80% of those infected will have mild symptoms. People of any age may become infected with COVID-19 and have the ability to transmit the virus. The most common symptoms include: fever,  fatigue, cough, body aches, headaches, sore throat, nasal congestion, shortness of breath, nausea, vomiting, diarrhea, changes in smell and/or taste.    COURSE OF ILLNESS Some patients may begin with mild disease which can progress quickly into critical symptoms. If your symptoms are worsening please call ahead to the Emergency Department and proceed there for further treatment. Recovery time appears to be roughly 1-2 weeks for mild symptoms and 3-6 weeks for severe disease.   GO IMMEDIATELY TO ER FOR FEVER YOU ARE UNABLE TO GET DOWN WITH TYLENOL, BREATHING PROBLEMS, CHEST PAIN, FATIGUE, LETHARGY, INABILITY TO EAT OR DRINK, ETC  QUARANTINE AND ISOLATION: To help decrease the spread of COVID-19 please remain isolated if you have COVID infection or are highly suspected to have COVID infection. This means -stay home and isolate to one room in the home if you live with others. Do not share a bed or bathroom with others while ill, sanitize and wipe down all countertops and keep common areas clean and disinfected. Stay home for 5 days. If you have no symptoms or your symptoms are resolving after 5 days, you can leave your house. Continue to wear a mask around others for 5 additional days. If you have been in close contact (within 6 feet) of someone diagnosed with COVID 19, you are advised to quarantine in your home for 14 days as symptoms can develop anywhere from 2-14 days after exposure to the virus. If you develop symptoms, you  must isolate.  Most current guidelines for COVID after exposure -unvaccinated: isolate 5 days and strict mask use x 5 days. Test on day  5 is possible -vaccinated: wear mask x 10 days if symptoms do not develop -You do not necessarily need to be tested for COVID if you have + exposure and  develop symptoms. Just isolate at home x10 days from symptom onset During this global pandemic, CDC advises to practice social distancing, try to stay at least 59f away from others at all times.  Wear a face covering. Wash and sanitize your hands regularly and avoid going anywhere that is not necessary.  KEEP IN MIND THAT THE COVID TEST IS NOT 100% ACCURATE AND YOU SHOULD STILL DO EVERYTHING TO PREVENT POTENTIAL SPREAD OF VIRUS TO OTHERS (WEAR MASK, WEAR GLOVES, WKenmarHANDS AND SANITIZE REGULARLY). IF INITIAL TEST IS NEGATIVE, THIS MAY NOT MEAN YOU ARE DEFINITELY NEGATIVE. MOST ACCURATE TESTING IS DONE 5-7 DAYS AFTER EXPOSURE.   It is not advised by CDC to get re-tested after receiving a positive COVID test since you can still test positive for weeks to months after you have already cleared the virus.   *If you have not been vaccinated for COVID, I strongly suggest you consider getting vaccinated as long as there are no contraindications.

## 2020-10-26 LAB — SARS CORONAVIRUS 2 (TAT 6-24 HRS): SARS Coronavirus 2: NEGATIVE

## 2020-11-09 DIAGNOSIS — C9001 Multiple myeloma in remission: Secondary | ICD-10-CM | POA: Diagnosis not present

## 2020-11-29 ENCOUNTER — Other Ambulatory Visit: Payer: Self-pay

## 2020-11-29 ENCOUNTER — Ambulatory Visit (INDEPENDENT_AMBULATORY_CARE_PROVIDER_SITE_OTHER): Payer: Medicare Other | Admitting: Internal Medicine

## 2020-11-29 VITALS — BP 126/78 | HR 62 | Temp 97.6°F | Resp 16 | Ht 68.0 in | Wt 166.4 lb

## 2020-11-29 DIAGNOSIS — Z1211 Encounter for screening for malignant neoplasm of colon: Secondary | ICD-10-CM

## 2020-11-29 DIAGNOSIS — D649 Anemia, unspecified: Secondary | ICD-10-CM

## 2020-11-29 DIAGNOSIS — E041 Nontoxic single thyroid nodule: Secondary | ICD-10-CM

## 2020-11-29 DIAGNOSIS — E7800 Pure hypercholesterolemia, unspecified: Secondary | ICD-10-CM

## 2020-11-29 DIAGNOSIS — C9001 Multiple myeloma in remission: Secondary | ICD-10-CM | POA: Diagnosis not present

## 2020-11-29 LAB — LIPID PANEL
Cholesterol: 218 mg/dL — ABNORMAL HIGH (ref 0–200)
HDL: 52.1 mg/dL (ref >39.00)
LDL Cholesterol: 151 mg/dL — ABNORMAL HIGH (ref 0–99)
NonHDL: 165.53
Total CHOL/HDL Ratio: 4
Triglycerides: 72 mg/dL (ref 0.0–149.0)
VLDL: 14.4 mg/dL (ref 0.0–40.0)

## 2020-11-29 LAB — TSH: TSH: 1.11 u[IU]/mL (ref 0.35–5.50)

## 2020-11-29 NOTE — Progress Notes (Signed)
Patient ID: April Chapman, female   DOB: 11/25/1950, 70 y.o.   MRN: [id number]   Subjective:    Patient ID: April Chapman, female    DOB: [birth year], 70 y.o.   MRN: [id number]  This visit occurred during the SARS-CoV-2 public health emergency.  Safety protocols were in place, including screening questions prior to the visit, additional usage of staff PPE, and extensive cleaning of exam room while observing appropriate contact time as indicated for disinfecting solutions.   Patient here for scheduled follow up.  Chief Complaint  Patient presents with   Hyperlipidemia   .   HPI She has a history of multiple myeloma - in remission.  Followed by hematology.  Had a respiratory infection around 10/25/20.  Treated with doxycycline and albuterol inhaler.  These symptoms resolved.  No cough or congestion.  No chest pain or sob.  She is eating and drinking well.  No acid reflux reported.  No abdominal pain.  Bowels moving.  Saw endocrinology 03/2020 for evaluation thyroid nodule.  Felt no need for biopsy.  Recommended f/u thyroid ultrasound in 03/2021.  Was recently started on crestor.  Intolerance - muscle aching/leg pain.   Off now.  Feels better off.  Low cholesterol diet and exercise.     Past Medical History:  Diagnosis Date   Chronic kidney disease H/O   stones   History of chicken pox    Multiple myeloma ([place])    s/p stem cell transplant (08/2010)   Past Surgical History:  Procedure Laterality Date   APPENDECTOMY  03/2015   APPENDECTOMY     Family History  Problem Relation Age of Onset   Mental illness Mother    Hypercholesterolemia Sister    Colon cancer Neg Hx    Breast cancer Neg Hx    Social History   Socioeconomic History   Marital status: Widowed    Spouse name: Not on file   Number of children: 2   Years of education: Not on file   Highest education level: Not on file  Occupational History   Occupation: retired    Fish farm manager: [employer]  Tobacco Use   Smoking  status: Never   Smokeless tobacco: Never  Vaping Use   Vaping Use: Never used  Substance and Sexual Activity   Alcohol use: No   Drug use: No   Sexual activity: Never  Other Topics Concern   Not on file  Social History Narrative   Not on file   Social Determinants of Health   Financial Resource Strain: Low Risk    Difficulty of Paying Living Expenses: Not hard at all  Food Insecurity: No Food Insecurity   Worried About Charity fundraiser in the Last Year: Never true   [place] in the Last Year: Never true  Transportation Needs: No Transportation Needs   Lack of Transportation (Medical): No   Lack of Transportation (Non-Medical): No  Physical Activity: Insufficiently Active   Days of Exercise per Week: 4 days   Minutes of Exercise per Session: 20 min  Stress: No Stress Concern Present   Feeling of Stress : Not at all  Social Connections: Unknown   Frequency of Communication with Friends and Family: More than three times a week   Frequency of Social Gatherings with Friends and Family: More than three times a week   Attends Religious Services: Not on Electrical engineer or Organizations: Yes   Attends Archivist Meetings:  Not on file   Marital Status: Widowed     Review of Systems  Constitutional:  Negative for appetite change and unexpected weight change.  HENT:  Negative for congestion and sinus pressure.   Respiratory:  Negative for cough, chest tightness and shortness of breath.   Cardiovascular:  Negative for chest pain, palpitations and leg swelling.  Gastrointestinal:  Negative for abdominal pain, diarrhea, nausea and vomiting.  Genitourinary:  Negative for difficulty urinating and dysuria.  Musculoskeletal:  Negative for joint swelling and myalgias.  Skin:  Negative for color change and rash.  Neurological:  Negative for dizziness, light-headedness and headaches.  Psychiatric/Behavioral:  Negative for agitation and dysphoric mood.        Objective:     BP 126/78   Pulse 62   Temp 97.6 F (36.4 C)   Resp 16   Ht '5\' 8"'  (1.727 m)   Wt 166 lb 6.4 oz (75.5 kg)   SpO2 99%   BMI 25.30 kg/m  Wt Readings from Last 3 Encounters:  11/29/20 166 lb 6.4 oz (75.5 kg)  09/26/20 162 lb (73.5 kg)  05/23/20 162 lb 0.6 oz (73.5 kg)    Physical Exam Vitals reviewed.  Constitutional:      General: She is not in acute distress.    Appearance: Normal appearance.  HENT:     Head: Normocephalic and atraumatic.     Right Ear: External ear normal.     Left Ear: External ear normal.  Eyes:     General: No scleral icterus.       Right eye: No discharge.        Left eye: No discharge.     Conjunctiva/sclera: Conjunctivae normal.  Neck:     Thyroid: No thyromegaly.  Cardiovascular:     Rate and Rhythm: Normal rate and regular rhythm.  Pulmonary:     Effort: No respiratory distress.     Breath sounds: Normal breath sounds. No wheezing.  Abdominal:     General: Bowel sounds are normal.     Palpations: Abdomen is soft.     Tenderness: There is no abdominal tenderness.  Musculoskeletal:        General: No swelling or tenderness.     Cervical back: Neck supple. No tenderness.  Lymphadenopathy:     Cervical: No cervical adenopathy.  Skin:    Findings: No erythema or rash.  Neurological:     Mental Status: She is alert.  Psychiatric:        Mood and Affect: Mood normal.        Behavior: Behavior normal.     Outpatient Encounter Medications as of 11/29/2020  Medication Sig   [DISCONTINUED] albuterol (VENTOLIN HFA) 108 (90 Base) MCG/ACT inhaler Inhale 1-2 puffs into the lungs every 6 (six) hours as needed for up to 10 days for wheezing or shortness of breath.   [DISCONTINUED] guaiFENesin-codeine (CHERATUSSIN AC) 100-10 MG/5ML syrup Take 10 mLs by mouth 3 (three) times daily as needed for cough.   [DISCONTINUED] ibuprofen (ADVIL) 200 MG tablet Take 200 mg by mouth every 6 (six) hours as needed.   [DISCONTINUED] rosuvastatin  (CRESTOR) 5 MG tablet Take 1 tablet (5 mg total) by mouth daily.   No facility-administered encounter medications on file as of 11/29/2020.     Lab Results  Component Value Date   WBC 8.1 01/21/2017   HGB 11.3 (L) 01/21/2017   HCT 34.4 (L) 01/21/2017   PLT 256.0 01/21/2017   GLUCOSE 91 01/13/2020  CHOL 218 (H) 11/29/2020   TRIG 72.0 11/29/2020   HDL 52.10 11/29/2020   LDLCALC 151 (H) 11/29/2020   ALT 10 01/13/2020   AST 16 01/13/2020   NA 139 01/13/2020   K 3.9 01/13/2020   CL 105 01/13/2020   CREATININE 0.79 01/13/2020   BUN 20 01/13/2020   CO2 27 01/13/2020   TSH 1.11 11/29/2020   INR 1.0 04/01/2014    DG Chest 2 View  Result Date: 10/25/2020 CLINICAL DATA:  Cough, fever EXAM: CHEST - 2 VIEW COMPARISON:  03/10/2020 FINDINGS: Heart and mediastinal contours are within normal limits. No focal opacities or effusions. No acute bony abnormality. Mild hyperinflation. IMPRESSION: No active cardiopulmonary disease. Electronically Signed   By: Rolm Baptise M.D.   On: 10/25/2020 19:10       Assessment & Plan:   Problem List Items Addressed This Visit     Anemia    CBC followed by hematology.       Colon cancer screening - Primary    cologuard 05/2016 - negative.  Agreeable for f/u - cologuard.        Relevant Orders   Cologuard   Hypercholesterolemia    Intolerance to crestor.  Feels better off.  Recheck cholesterol today.  Low cholesterol diet and exercise.  Discussed trial of another statin.  She is agreeable.  See results of recent cholesterol panel.       Relevant Orders   Lipid panel (Completed)   Multiple myeloma in remission [facility])    S/p second transplant.  Followed by oncology.  Stable.       Thyroid nodule    Recently evaluated by Dr Gabriel Carina.  Stable.  Recommended f/u ultrasound in one year - due 03/2021.       Relevant Orders   TSH (Completed)     Einar Pheasant, MD

## 2020-11-30 ENCOUNTER — Other Ambulatory Visit: Payer: Self-pay

## 2020-11-30 DIAGNOSIS — Z0000 Encounter for general adult medical examination without abnormal findings: Secondary | ICD-10-CM

## 2020-11-30 MED ORDER — PRAVASTATIN SODIUM 10 MG PO TABS: 10.0000 mg | ORAL_TABLET | ORAL | 1 refills | Status: DC

## 2020-12-05 ENCOUNTER — Encounter: Payer: Self-pay | Admitting: Internal Medicine

## 2020-12-05 DIAGNOSIS — L821 Other seborrheic keratosis: Secondary | ICD-10-CM | POA: Diagnosis not present

## 2020-12-05 DIAGNOSIS — Z1211 Encounter for screening for malignant neoplasm of colon: Secondary | ICD-10-CM | POA: Insufficient documentation

## 2020-12-05 DIAGNOSIS — C44629 Squamous cell carcinoma of skin of left upper limb, including shoulder: Secondary | ICD-10-CM | POA: Diagnosis not present

## 2020-12-05 DIAGNOSIS — D485 Neoplasm of uncertain behavior of skin: Secondary | ICD-10-CM | POA: Diagnosis not present

## 2020-12-05 DIAGNOSIS — L570 Actinic keratosis: Secondary | ICD-10-CM | POA: Diagnosis not present

## 2020-12-05 NOTE — Assessment & Plan Note (Signed)
CBC followed by hematology.

## 2020-12-05 NOTE — Assessment & Plan Note (Signed)
Intolerance to crestor.  Feels better off.  Recheck cholesterol today.  Low cholesterol diet and exercise.  Discussed trial of another statin.  She is agreeable.  See results of recent cholesterol panel.

## 2020-12-05 NOTE — Assessment & Plan Note (Signed)
S/p second transplant.  Followed by oncology.  Stable.

## 2020-12-05 NOTE — Assessment & Plan Note (Signed)
Recently evaluated by Dr Gabriel Carina.  Stable.  Recommended f/u ultrasound in one year - due 03/2021.

## 2020-12-05 NOTE — Assessment & Plan Note (Signed)
cologuard 05/2016 - negative.  Agreeable for f/u - cologuard.

## 2020-12-20 DIAGNOSIS — Z1211 Encounter for screening for malignant neoplasm of colon: Secondary | ICD-10-CM | POA: Diagnosis not present

## 2020-12-26 LAB — COLOGUARD: Cologuard: NEGATIVE

## 2021-01-12 ENCOUNTER — Other Ambulatory Visit: Payer: Self-pay

## 2021-01-12 ENCOUNTER — Other Ambulatory Visit (INDEPENDENT_AMBULATORY_CARE_PROVIDER_SITE_OTHER): Payer: Medicare Other

## 2021-01-12 DIAGNOSIS — Z0000 Encounter for general adult medical examination without abnormal findings: Secondary | ICD-10-CM

## 2021-01-12 DIAGNOSIS — C44629 Squamous cell carcinoma of skin of left upper limb, including shoulder: Secondary | ICD-10-CM | POA: Diagnosis not present

## 2021-01-12 NOTE — Addendum Note (Signed)
Addended by: Leeanne Rio on: 01/12/2021 02:24 PM   Modules accepted: Orders

## 2021-01-13 LAB — HEPATIC FUNCTION PANEL
ALT: 10 IU/L (ref 0–32)
AST: 18 IU/L (ref 0–40)
Albumin: 4.4 g/dL (ref 3.8–4.8)
Alkaline Phosphatase: 68 IU/L (ref 44–121)
Bilirubin Total: 0.3 mg/dL (ref 0.0–1.2)
Bilirubin, Direct: 0.12 mg/dL (ref 0.00–0.40)
Total Protein: 6.9 g/dL (ref 6.0–8.5)

## 2021-01-16 DIAGNOSIS — M67442 Ganglion, left hand: Secondary | ICD-10-CM | POA: Diagnosis not present

## 2021-01-16 DIAGNOSIS — M720 Palmar fascial fibromatosis [Dupuytren]: Secondary | ICD-10-CM | POA: Diagnosis not present

## 2021-01-23 ENCOUNTER — Telehealth: Payer: Self-pay | Admitting: Internal Medicine

## 2021-01-23 NOTE — Telephone Encounter (Signed)
Can work in Thursday 12:00.

## 2021-01-23 NOTE — Telephone Encounter (Signed)
Pt called in complaining that every time she lays down she hear her blood pressure whooshing in her head. Pt stated that she looked up the symptoms on the Internet and the Internet gave back scary results. Pt is unable to come in office, she is watching grand baby today. Transfer Pt over to access nurse. Access Nurse coordinator Claria Dice stated for me to advise Pt that the call wait is long and that someone will give Pt callback with 47min.

## 2021-01-23 NOTE — Telephone Encounter (Signed)
Returned call to patient says she has been on the Internet and it said she should call her doctor if she hears swooshing sounds in her head , says she hears  her heart beat and swooshing sound like her blood pressure or hear her rhythm. Denies dizziness, she did have about with vertigo 2 nights ago while lying in bed doing a puzzle she could hear the swooshing sound and then felt like vertigo sensation , but when she laid there for about 30 seconds and it stopped. Other nights the swooshing last until she falls off to sleep , patient has no way to take her blood pressure. During the day she says she feels perfectly fine and hears no swooshing sounds.

## 2021-01-23 NOTE — Telephone Encounter (Signed)
LM to schedule patient for 01/25/21 at 12:00

## 2021-02-01 DIAGNOSIS — C9001 Multiple myeloma in remission: Secondary | ICD-10-CM | POA: Diagnosis not present

## 2021-02-01 DIAGNOSIS — T380X5S Adverse effect of glucocorticoids and synthetic analogues, sequela: Secondary | ICD-10-CM | POA: Diagnosis not present

## 2021-02-15 ENCOUNTER — Other Ambulatory Visit: Payer: Self-pay

## 2021-02-15 ENCOUNTER — Ambulatory Visit
Admission: RE | Admit: 2021-02-15 | Discharge: 2021-02-15 | Disposition: A | Discharge status: Home / Self Care | Payer: Medicare Other | Source: Ambulatory Visit | Attending: Family Medicine | Admitting: Family Medicine

## 2021-02-15 VITALS — BP 123/73 | HR 70 | Temp 98.7°F | Resp 16

## 2021-02-15 DIAGNOSIS — J209 Acute bronchitis, unspecified: Secondary | ICD-10-CM

## 2021-02-15 DIAGNOSIS — R058 Other specified cough: Secondary | ICD-10-CM

## 2021-02-15 MED ORDER — PROMETHAZINE-DM 6.25-15 MG/5ML PO SYRP: 5.0000 mL | ORAL_SOLUTION | Freq: Four times a day (QID) | ORAL | 0 refills | Status: DC | PRN

## 2021-02-15 MED ORDER — PREDNISONE 20 MG PO TABS: 40.0000 mg | ORAL_TABLET | Freq: Every day | ORAL | 0 refills | Status: DC

## 2021-02-15 MED ORDER — SULFAMETHOXAZOLE-TRIMETHOPRIM 800-160 MG PO TABS: 1.0000 | ORAL_TABLET | Freq: Two times a day (BID) | ORAL | 0 refills | Status: DC

## 2021-02-15 NOTE — ED Triage Notes (Signed)
Pt states she was dx with flu 3 weeks ago and she cant get rid of the cough and mucus.

## 2021-02-21 NOTE — ED Provider Notes (Signed)
[place]    CSN: [id number] Arrival date & time: 02/15/21  1443      History   Chief Complaint Chief Complaint  Patient presents with   Cough    HPI April Chapman is a 70 y.o. female.   HPI Patient presents for evaluation of ongoing productive cough.  Patient was diagnosed with influenza approximately 3 weeks ago and reports the cough has persisted although she has tried multiple anticough medications.  She endorses some mild nasal congestion but mainly her cough is the most worrisome symptom. She denies any recent fever. All flu like symptoms have resolved.  Past Medical History:  Diagnosis Date   Chronic kidney disease H/O   stones   History of chicken pox    Multiple myeloma ([place])    s/p stem cell transplant (08/2010)    Patient Active Problem List   Diagnosis Date Noted   Colon cancer screening 12/05/2020   Hand pain, left 10/17/2019   H/O bilateral breast implants 10/10/2018   Tinnitus 10/10/2018   Abdominal pain 01/26/2017   Anemia 01/26/2017   Tachycardia 04/21/2016   Thyroid nodule 04/15/2016   Hypercholesterolemia 04/15/2016   Health care maintenance 11/14/2015   Multiple myeloma in remission ([place]) 03/23/2015   Toenail fungus 10/24/2013   Numbness of toes 10/22/2013   Shoulder pain, left 04/27/2013   Elbow pain, right 04/27/2013   Heel pain 04/27/2013   Cat scratch 04/27/2013   Leg skin lesion, left 04/27/2013   Multiple myeloma ([place]) 01/04/2013    Past Surgical History:  Procedure Laterality Date   APPENDECTOMY  03/2015   APPENDECTOMY      OB History     Gravida  2   Para  2   Term      Preterm      AB      Living         SAB      IAB      Ectopic      Multiple      Live Births               Home Medications    Prior to Admission medications   Medication Sig Start Date End Date Taking? Authorizing Provider  pravastatin (PRAVACHOL) 10 MG tablet Take 1 tablet (10 mg total) by mouth 3 (three) times a  week. Monday, Wednesday, Friday 12/01/20  Yes Einar Pheasant, MD  predniSONE (DELTASONE) 20 MG tablet Take 2 tablets (40 mg total) by mouth daily with breakfast. 02/15/21  Yes Scot Jun, FNP  promethazine-dextromethorphan (PROMETHAZINE-DM) 6.25-15 MG/5ML syrup Take 5 mLs by mouth 4 (four) times daily as needed for cough. 02/15/21  Yes Scot Jun, FNP  sulfamethoxazole-trimethoprim (BACTRIM DS) 800-160 MG tablet Take 1 tablet by mouth 2 (two) times daily. 02/15/21  Yes Scot Jun, FNP    Family History Family History  Problem Relation Age of Onset   Mental illness Mother    Hypercholesterolemia Sister    Colon cancer Neg Hx    Breast cancer Neg Hx     Social History Social History   Tobacco Use   Smoking status: Never   Smokeless tobacco: Never  Vaping Use   Vaping Use: Never used  Substance Use Topics   Alcohol use: No   Drug use: No     Allergies   Revlimid [lenalidomide] and Pomalidomide   Review of Systems Review of Systems Pertinent negatives listed in HPI   Physical Exam Triage Vital  Signs ED Triage Vitals  Enc Vitals Group     BP 02/15/21 1516 123/73     Pulse Rate 02/15/21 1516 70     Resp 02/15/21 1516 16     Temp 02/15/21 1516 98.7 F (37.1 C)     Temp Source 02/15/21 1516 Oral     SpO2 02/15/21 1516 97 %     Weight --      Height --      Head Circumference --      Peak Flow --      Pain Score 02/15/21 1521 0     Pain Loc --      Pain Edu? --      Excl. in [place]? --    No data found.  Updated Vital Signs BP 123/73 (BP Location: Left Arm)   Pulse 70   Temp 98.7 F (37.1 C) (Oral)   Resp 16   SpO2 97%   Visual Acuity Right Eye Distance:   Left Eye Distance:   Bilateral Distance:    Right Eye Near:   Left Eye Near:    Bilateral Near:     Physical Exam Constitutional:      Appearance: Normal appearance.  HENT:     Head: Normocephalic and atraumatic.     Right Ear: Tympanic membrane normal.     Left Ear:  Tympanic membrane normal.     Nose: Nose normal.  Eyes:     Extraocular Movements: Extraocular movements intact.     Pupils: Pupils are equal, round, and reactive to light.  Cardiovascular:     Rate and Rhythm: Normal rate and regular rhythm.  Pulmonary:     Comments: Coarse lungs sounds with rhonchi  BL upper lung fields   Skin:    Capillary Refill: Capillary refill takes less than 2 seconds.  Neurological:     General: No focal deficit present.     Mental Status: She is alert and oriented to person, place, and time.  Psychiatric:        Mood and Affect: Mood normal.        Behavior: Behavior normal.        Thought Content: Thought content normal.    UC Treatments / Results  Labs (all labs ordered are listed, but only abnormal results are displayed) Labs Reviewed - No data to display  EKG   Radiology No results found.   Procedures Procedures (including critical care time)  Medications Ordered in UC Medications - No data to display  Initial Impression / Assessment and Plan / UC Course  I have reviewed the triage vital signs and the nursing notes.  Pertinent labs & imaging results that were available during my care of the patient were reviewed by me and considered in my medical decision making (see chart for details).    Acute bronchitis with post viral cough syndrome  Prednisone, promethazine, and Bactrim prescribed Strict return precautions given if symptoms worsen or do not improve Final Clinical Impressions(s) / UC Diagnoses   Final diagnoses:  Acute bronchitis, unspecified organism  Post-viral cough syndrome   Discharge Instructions   None    ED Prescriptions     Medication Sig Dispense Auth. Provider   predniSONE (DELTASONE) 20 MG tablet Take 2 tablets (40 mg total) by mouth daily with breakfast. 10 tablet Scot Jun, FNP   promethazine-dextromethorphan (PROMETHAZINE-DM) 6.25-15 MG/5ML syrup Take 5 mLs by mouth 4 (four) times daily as needed  for cough. 180 mL Scot Jun,  FNP   sulfamethoxazole-trimethoprim (BACTRIM DS) 800-160 MG tablet Take 1 tablet by mouth 2 (two) times daily. 20 tablet Scot Jun, FNP      PDMP not reviewed this encounter.   Scot Jun, FNP 02/21/21 [id number]

## 2021-04-02 ENCOUNTER — Encounter: Payer: Medicare Other | Admitting: Internal Medicine

## 2021-04-06 DIAGNOSIS — E041 Nontoxic single thyroid nodule: Secondary | ICD-10-CM | POA: Diagnosis not present

## 2021-04-13 DIAGNOSIS — E041 Nontoxic single thyroid nodule: Secondary | ICD-10-CM | POA: Diagnosis not present

## 2021-04-18 DIAGNOSIS — Z780 Asymptomatic menopausal state: Secondary | ICD-10-CM | POA: Diagnosis not present

## 2021-04-24 ENCOUNTER — Emergency Department
Admission: EM | Admit: 2021-04-24 | Discharge: 2021-04-25 | Disposition: A | Discharge status: Home / Self Care | Payer: Medicare Other | Attending: Emergency Medicine | Admitting: Emergency Medicine

## 2021-04-24 ENCOUNTER — Encounter: Payer: Self-pay | Admitting: *Deleted

## 2021-04-24 ENCOUNTER — Other Ambulatory Visit: Payer: Self-pay

## 2021-04-24 ENCOUNTER — Emergency Department: Payer: Medicare Other

## 2021-04-24 DIAGNOSIS — N189 Chronic kidney disease, unspecified: Secondary | ICD-10-CM | POA: Diagnosis not present

## 2021-04-24 DIAGNOSIS — R079 Chest pain, unspecified: Secondary | ICD-10-CM | POA: Diagnosis not present

## 2021-04-24 DIAGNOSIS — R61 Generalized hyperhidrosis: Secondary | ICD-10-CM | POA: Diagnosis not present

## 2021-04-24 DIAGNOSIS — R42 Dizziness and giddiness: Secondary | ICD-10-CM | POA: Insufficient documentation

## 2021-04-24 DIAGNOSIS — R002 Palpitations: Secondary | ICD-10-CM | POA: Insufficient documentation

## 2021-04-24 DIAGNOSIS — R110 Nausea: Secondary | ICD-10-CM | POA: Diagnosis not present

## 2021-04-24 DIAGNOSIS — Z8579 Personal history of other malignant neoplasms of lymphoid, hematopoietic and related tissues: Secondary | ICD-10-CM | POA: Insufficient documentation

## 2021-04-24 DIAGNOSIS — R000 Tachycardia, unspecified: Secondary | ICD-10-CM | POA: Diagnosis not present

## 2021-04-24 DIAGNOSIS — R0602 Shortness of breath: Secondary | ICD-10-CM | POA: Diagnosis not present

## 2021-04-24 DIAGNOSIS — R0789 Other chest pain: Secondary | ICD-10-CM | POA: Diagnosis not present

## 2021-04-24 LAB — BASIC METABOLIC PANEL
Anion gap: 6 (ref 5–15)
BUN: 16 mg/dL (ref 8–23)
CO2: 26 mmol/L (ref 22–32)
Calcium: 9.5 mg/dL (ref 8.9–10.3)
Chloride: 105 mmol/L (ref 98–111)
Creatinine, Ser: 0.91 mg/dL (ref 0.44–1.00)
GFR, Estimated: >60 mL/min (ref >60)
Glucose, Bld: 131 mg/dL — ABNORMAL HIGH (ref 70–99)
Potassium: 4 mmol/L (ref 3.5–5.1)
Sodium: 137 mmol/L (ref 135–145)

## 2021-04-24 LAB — CBC
HCT: 42.2 % (ref 36.0–46.0)
Hemoglobin: 13.7 g/dL (ref 12.0–15.0)
MCH: 30.3 pg (ref 26.0–34.0)
MCHC: 32.5 g/dL (ref 30.0–36.0)
MCV: 93.4 fL (ref 80.0–100.0)
Platelets: 186 10*3/uL (ref 150–400)
RBC: 4.52 MIL/uL (ref 3.87–5.11)
RDW: 13.4 % (ref 11.5–15.5)
WBC: 5.4 10*3/uL (ref 4.0–10.5)
nRBC: 0 % (ref 0.0–0.2)

## 2021-04-24 LAB — TROPONIN I (HIGH SENSITIVITY): Troponin I (High Sensitivity): 6 ng/L (ref <18)

## 2021-04-24 MED ORDER — ASPIRIN 81 MG PO CHEW
324.0000 mg | CHEWABLE_TABLET | Freq: Once | ORAL | Status: AC
  Administered 2021-04-24: 324 mg via ORAL
  Filled 2021-04-24: qty 4

## 2021-04-24 NOTE — ED Triage Notes (Signed)
Pt reports rapid heart rate, chest pain and nausea.  Sx began tonight.  Hx afib.  No sob.  Pt alert  speech clear.

## 2021-04-24 NOTE — ED Provider Notes (Signed)
[facility] Provider Note    Event Date/Time   First MD Initiated Contact with Patient 04/24/21 2258     (approximate)   History   Chest Pain   HPI  April Chapman is a 71 y.o. female with history of multiple myeloma status post stem cell transplant in 2012 and 2018 at [facility], hyperlipidemia who presents to the emergency department with her daughter with complaints of chest discomfort, palpitations.  States she felt like her heart was pounding around 9 PM tonight.  States she was at rest when her symptoms started.  She states she felt chilled and had nausea but no vomiting, diaphoresis, shortness of breath, chest pressure or tightness.  No known fevers or cough.  She states that she walked to her daughter's house to have her check her heart rate.  Daughter states it felt irregular but she did not think that it was excessively fast.  Patient has no previous history of arrhythmia.  She states now her symptoms have resolved.  Her symptoms did not get worse with exertion when walking to her daughter's house.  She denies any history of PE or DVT.  No lower extremity swelling or pain.  Patient denies previous history of stress test or cardiac catheterization.  She does not have a cardiologist.   History provided by patient and daughter.    Past Medical History:  Diagnosis Date   Chronic kidney disease H/O   stones   History of chicken pox    Multiple myeloma ([place])    s/p stem cell transplant (08/2010)    Past Surgical History:  Procedure Laterality Date   APPENDECTOMY  03/2015   APPENDECTOMY      MEDICATIONS:  Prior to Admission medications   Medication Sig Start Date End Date Taking? Authorizing Provider  pravastatin (PRAVACHOL) 10 MG tablet Take 1 tablet (10 mg total) by mouth 3 (three) times a week. Monday, Wednesday, Friday 12/01/20   Einar Pheasant, MD  predniSONE (DELTASONE) 20 MG tablet Take 2 tablets (40 mg total) by mouth daily with breakfast.  02/15/21   Scot Jun, FNP  promethazine-dextromethorphan (PROMETHAZINE-DM) 6.25-15 MG/5ML syrup Take 5 mLs by mouth 4 (four) times daily as needed for cough. 02/15/21   Scot Jun, FNP  sulfamethoxazole-trimethoprim (BACTRIM DS) 800-160 MG tablet Take 1 tablet by mouth 2 (two) times daily. 02/15/21   Scot Jun, FNP    Physical Exam   Triage Vital Signs: ED Triage Vitals  Enc Vitals Group     BP 04/24/21 2156 136/73     Pulse Rate 04/24/21 2156 91     Resp 04/24/21 2156 20     Temp 04/24/21 2156 98 F (36.7 C)     Temp Source 04/24/21 2156 Oral     SpO2 04/24/21 2156 95 %     Weight 04/24/21 2153 166 lb (75.3 kg)     Height 04/24/21 2153 '5\' 7"'  (1.702 m)     Head Circumference --      Peak Flow --      Pain Score 04/24/21 2153 7     Pain Loc --      Pain Edu? --      Excl. in [place]? --     Most recent vital signs: Vitals:   04/24/21 2255 04/24/21 2300  BP:  127/78  Pulse: 69 66  Resp: 16 18  Temp:    SpO2: 98% 99%    CONSTITUTIONAL: Alert and oriented and responds appropriately  to questions. Well-appearing; well-nourished HEAD: Normocephalic, atraumatic EYES: Conjunctivae clear, pupils appear equal, sclera nonicteric ENT: normal nose; moist mucous membranes NECK: Supple, normal ROM CARD: RRR; S1 and S2 appreciated; no murmurs, no clicks, no rubs, no gallops RESP: Normal chest excursion without splinting or tachypnea; breath sounds clear and equal bilaterally; no wheezes, no rhonchi, no rales, no hypoxia or respiratory distress, speaking full sentences ABD/GI: Normal bowel sounds; non-distended; soft, non-tender, no rebound, no guarding, no peritoneal signs BACK: The back appears normal EXT: Normal ROM in all joints; no deformity noted, no edema; no cyanosis, no calf tenderness or calf swelling SKIN: Normal color for age and race; warm; no rash on exposed skin NEURO: Moves all extremities equally, normal speech PSYCH: The patient's mood and manner are  appropriate.   ED Results / Procedures / Treatments   LABS: (all labs ordered are listed, but only abnormal results are displayed) Labs Reviewed  BASIC METABOLIC PANEL - Abnormal; Notable for the following components:      Result Value   Glucose, Bld 131 (*)    All other components within normal limits  CBC  MAGNESIUM  TSH  TROPONIN I (HIGH SENSITIVITY)  TROPONIN I (HIGH SENSITIVITY)     EKG:  EKG Interpretation  Date/Time:  Tuesday April 24 2021 21:51:22 EST Ventricular Rate:  94 PR Interval:  162 QRS Duration: 76 QT Interval:  360 QTC Calculation: 450 R Axis:   50 Text Interpretation: Normal sinus rhythm Nonspecific ST and T wave abnormality Abnormal ECG When compared with ECG of 02-Apr-2014 15:22, Vent. rate has increased BY  34 BPM T wave inversion no longer evident in Anterior leads Nonspecific T wave abnormality now evident in Lateral leads Confirmed by Jeanene Mena, Cyril Mourning [phone]) on 04/24/2021 11:00:12 PM         EKG Interpretation  Date/Time:  Tuesday April 24 2021 23:21:42 EST Ventricular Rate:  60 PR Interval:  161 QRS Duration: 94 QT Interval:  432 QTC Calculation: 432 R Axis:   58 Text Interpretation: Sinus rhythm T wave changes have resolved Confirmed by Pryor Curia [phone]) on 04/24/2021 11:30:02 PM         RADIOLOGY: My personal review and interpretation of imaging: Chest x-ray clear.  I have personally reviewed all radiology reports.   DG Chest 2 View  Result Date: 04/24/2021 CLINICAL DATA:  Chest pain, rapid heart rate, and nausea beginning tonight. EXAM: CHEST - 2 VIEW COMPARISON:  10/25/2020 FINDINGS: Bilateral breast implants. Normal heart size and pulmonary vascularity. No focal airspace disease or consolidation in the lungs. No blunting of costophrenic angles. No pneumothorax. Mediastinal contours appear intact. Degenerative changes in the spine. IMPRESSION: No active cardiopulmonary disease. Electronically Signed   By: Lucienne Capers M.D.    On: 04/24/2021 22:09     PROCEDURES:  Critical Care performed: No      .1-3 Lead EKG Interpretation Performed by: Zaidyn Claire, Delice Bison, DO Authorized by: Tage Feggins, Delice Bison, DO     Interpretation: normal     ECG rate:  70   ECG rate assessment: normal     Rhythm: sinus rhythm     Ectopy: none     Conduction: normal      IMPRESSION / MDM / ASSESSMENT AND PLAN / ED COURSE  I reviewed the triage vital signs and the nursing notes.    Patient here with palpitations, chills, nausea.  Symptoms now resolved.  The patient is on the cardiac monitor to evaluate for evidence of arrhythmia and/or significant heart  rate changes.   DIFFERENTIAL DIAGNOSIS (includes but not limited to):   Cardiac arrhythmia, ACS, PE, less likely dissection.  Differential also includes infectious etiology such as COVID, influenza, pneumonia.  No signs of volume overload no history of CHF.   PLAN: We will obtain CBC, BMP, troponin x2, chest x-ray.  Initial EKG concerning for diffuse minimal ST depression.  Last for comparison was in 2016.  No ST elevation.  States she was not having any symptoms when her first EKG was obtained.  Will repeat EKG.  Currently asymptomatic.  Does not need nitroglycerin.  Will give full dose aspirin.  Offered to test for COVID and flu which patient declines.  Discussed with patient that I suspect dissection is unlikely given she has not asymptomatic.  She is at high risk for PE given history of multiple myeloma but again given she is hemodynamically stable and asymptomatic, low suspicion for clinically significant PE that caused her symptoms today.   MEDICATIONS GIVEN IN ED: Medications  aspirin chewable tablet 324 mg (324 mg Oral Given 04/24/21 2340)     ED COURSE: Patient has been asymptomatic her entire time in the emergency department.  She has been monitored on cardiac monitoring with no abnormal events, arrhythmia noted.  She has had 2 negative troponins.  Normal hemoglobin,  potassium, magnesium, renal function.  Chest x-ray reviewed by myself and radiologist and is clear.  Repeat EKG is completely normal and the mild diffuse ST depression has resolved.  We did discuss these EKG findings and have recommended very close cardiac follow-up.  Given she is asymptomatic and never truly had chest pressure/tightness, shortness of breath, dizziness, diaphoresis and only had palpitations, I feel it is reasonable for her to go home and follow-up with cardiology as an outpatient given her only true risk factors for ACS are hyperlipidemia and age.  Patient and her daughter are very comfortable with this plan and would prefer discharge home rather than admission to the hospital.  Discussed strict return precautions.  They verbalized understanding.  Will provide with outpatient cardiology follow-up information.   At this time, I do not feel there is any life-threatening condition present. I reviewed all nursing notes, vitals, pertinent previous records.  All lab and urine results, EKGs, imaging ordered have been independently reviewed and interpreted by myself.  I reviewed all available radiology reports from any imaging ordered this visit.  Based on my assessment, I feel the patient is safe to be discharged home without further emergent workup and can continue workup as an outpatient as needed. Discussed all findings, treatment plan as well as usual and customary return precautions with patient and daughter.  They verbalize understanding and are comfortable with this plan.  Outpatient follow-up has been provided as needed.  All questions have been answered.   CONSULTS: Admission to the hospital not needed at this time given negative work-up, 2 normal troponins, patient asymptomatic.   OUTSIDE RECORDS REVIEWED: Reviewed recent oncology note from [facility] on 02/01/2021.  Last CyBorD in Feb 2022.  In remission.         FINAL CLINICAL IMPRESSION(S) / ED DIAGNOSES   Final diagnoses:   Palpitations     Rx / DC Orders   ED Discharge Orders     None        Note:  This document was prepared using Dragon voice recognition software and may include unintentional dictation errors.   Newt Levingston, Delice Bison, DO 04/25/21 [phone]

## 2021-04-25 LAB — TSH: TSH: 2.627 u[IU]/mL (ref 0.350–4.500)

## 2021-04-25 LAB — TROPONIN I (HIGH SENSITIVITY): Troponin I (High Sensitivity): 7 ng/L (ref <18)

## 2021-04-25 LAB — MAGNESIUM: Magnesium: 2.1 mg/dL (ref 1.7–2.4)

## 2021-05-03 DIAGNOSIS — M8580 Other specified disorders of bone density and structure, unspecified site: Secondary | ICD-10-CM | POA: Diagnosis not present

## 2021-05-03 DIAGNOSIS — C9001 Multiple myeloma in remission: Secondary | ICD-10-CM | POA: Diagnosis not present

## 2021-05-13 ENCOUNTER — Other Ambulatory Visit: Payer: Self-pay | Admitting: Internal Medicine

## 2021-06-07 ENCOUNTER — Encounter: Payer: Self-pay | Admitting: Intensive Care

## 2021-06-07 ENCOUNTER — Other Ambulatory Visit: Payer: Self-pay

## 2021-06-07 ENCOUNTER — Emergency Department: Payer: Medicare Other

## 2021-06-07 ENCOUNTER — Inpatient Hospital Stay
Admission: EM | Admit: 2021-06-07 | Discharge: 2021-06-10 | DRG: 194 | Disposition: A | Discharge status: Home Health | Payer: Medicare Other | Attending: Internal Medicine | Admitting: Internal Medicine

## 2021-06-07 DIAGNOSIS — R918 Other nonspecific abnormal finding of lung field: Secondary | ICD-10-CM | POA: Diagnosis not present

## 2021-06-07 DIAGNOSIS — E782 Mixed hyperlipidemia: Secondary | ICD-10-CM | POA: Diagnosis not present

## 2021-06-07 DIAGNOSIS — Z818 Family history of other mental and behavioral disorders: Secondary | ICD-10-CM | POA: Diagnosis not present

## 2021-06-07 DIAGNOSIS — Z79899 Other long term (current) drug therapy: Secondary | ICD-10-CM | POA: Diagnosis not present

## 2021-06-07 DIAGNOSIS — R0689 Other abnormalities of breathing: Secondary | ICD-10-CM | POA: Diagnosis not present

## 2021-06-07 DIAGNOSIS — E876 Hypokalemia: Secondary | ICD-10-CM | POA: Diagnosis not present

## 2021-06-07 DIAGNOSIS — Z9484 Stem cells transplant status: Secondary | ICD-10-CM

## 2021-06-07 DIAGNOSIS — Z86718 Personal history of other venous thrombosis and embolism: Secondary | ICD-10-CM | POA: Diagnosis not present

## 2021-06-07 DIAGNOSIS — J168 Pneumonia due to other specified infectious organisms: Secondary | ICD-10-CM | POA: Diagnosis not present

## 2021-06-07 DIAGNOSIS — C9001 Multiple myeloma in remission: Secondary | ICD-10-CM | POA: Diagnosis present

## 2021-06-07 DIAGNOSIS — E871 Hypo-osmolality and hyponatremia: Secondary | ICD-10-CM

## 2021-06-07 DIAGNOSIS — E860 Dehydration: Secondary | ICD-10-CM | POA: Diagnosis not present

## 2021-06-07 DIAGNOSIS — J13 Pneumonia due to Streptococcus pneumoniae: Secondary | ICD-10-CM | POA: Diagnosis present

## 2021-06-07 DIAGNOSIS — Z8349 Family history of other endocrine, nutritional and metabolic diseases: Secondary | ICD-10-CM

## 2021-06-07 DIAGNOSIS — R059 Cough, unspecified: Secondary | ICD-10-CM | POA: Diagnosis not present

## 2021-06-07 DIAGNOSIS — Z888 Allergy status to other drugs, medicaments and biological substances status: Secondary | ICD-10-CM | POA: Diagnosis not present

## 2021-06-07 DIAGNOSIS — R7989 Other specified abnormal findings of blood chemistry: Secondary | ICD-10-CM

## 2021-06-07 DIAGNOSIS — Z20822 Contact with and (suspected) exposure to covid-19: Secondary | ICD-10-CM | POA: Diagnosis present

## 2021-06-07 DIAGNOSIS — J189 Pneumonia, unspecified organism: Secondary | ICD-10-CM | POA: Diagnosis not present

## 2021-06-07 DIAGNOSIS — R52 Pain, unspecified: Secondary | ICD-10-CM | POA: Diagnosis not present

## 2021-06-07 DIAGNOSIS — J154 Pneumonia due to other streptococci: Secondary | ICD-10-CM | POA: Diagnosis present

## 2021-06-07 DIAGNOSIS — I700 Atherosclerosis of aorta: Secondary | ICD-10-CM | POA: Diagnosis not present

## 2021-06-07 DIAGNOSIS — R079 Chest pain, unspecified: Secondary | ICD-10-CM | POA: Diagnosis not present

## 2021-06-07 DIAGNOSIS — R531 Weakness: Secondary | ICD-10-CM

## 2021-06-07 DIAGNOSIS — I959 Hypotension, unspecified: Secondary | ICD-10-CM | POA: Diagnosis not present

## 2021-06-07 DIAGNOSIS — R109 Unspecified abdominal pain: Secondary | ICD-10-CM | POA: Diagnosis not present

## 2021-06-07 DIAGNOSIS — R509 Fever, unspecified: Secondary | ICD-10-CM | POA: Diagnosis not present

## 2021-06-07 DIAGNOSIS — I77810 Thoracic aortic ectasia: Secondary | ICD-10-CM | POA: Diagnosis not present

## 2021-06-07 LAB — HEPATIC FUNCTION PANEL
ALT: 13 U/L (ref 0–44)
AST: 23 U/L (ref 15–41)
Albumin: 3.7 g/dL (ref 3.5–5.0)
Alkaline Phosphatase: 44 U/L (ref 38–126)
Bilirubin, Direct: 0.1 mg/dL (ref 0.0–0.2)
Indirect Bilirubin: 0.6 mg/dL (ref 0.3–0.9)
Total Bilirubin: 0.7 mg/dL (ref 0.3–1.2)
Total Protein: 7.2 g/dL (ref 6.5–8.1)

## 2021-06-07 LAB — BASIC METABOLIC PANEL
Anion gap: 10 (ref 5–15)
BUN: 21 mg/dL (ref 8–23)
CO2: 24 mmol/L (ref 22–32)
Calcium: 8.9 mg/dL (ref 8.9–10.3)
Chloride: 104 mmol/L (ref 98–111)
Creatinine, Ser: 0.83 mg/dL (ref 0.44–1.00)
GFR, Estimated: >60 mL/min (ref >60)
Glucose, Bld: 143 mg/dL — ABNORMAL HIGH (ref 70–99)
Potassium: 3.6 mmol/L (ref 3.5–5.1)
Sodium: 138 mmol/L (ref 135–145)

## 2021-06-07 LAB — URINALYSIS, ROUTINE W REFLEX MICROSCOPIC
Bacteria, UA: NONE SEEN
Bilirubin Urine: NEGATIVE
Glucose, UA: NEGATIVE mg/dL
Ketones, ur: NEGATIVE mg/dL
Leukocytes,Ua: NEGATIVE
Nitrite: NEGATIVE
Protein, ur: NEGATIVE mg/dL
Specific Gravity, Urine: 1.014 (ref 1.005–1.030)
WBC, UA: NONE SEEN WBC/hpf (ref 0–5)
pH: 5 (ref 5.0–8.0)

## 2021-06-07 LAB — CBC
HCT: 37.5 % (ref 36.0–46.0)
Hemoglobin: 12.2 g/dL (ref 12.0–15.0)
MCH: 29.3 pg (ref 26.0–34.0)
MCHC: 32.5 g/dL (ref 30.0–36.0)
MCV: 89.9 fL (ref 80.0–100.0)
Platelets: 129 10*3/uL — ABNORMAL LOW (ref 150–400)
RBC: 4.17 MIL/uL (ref 3.87–5.11)
RDW: 13 % (ref 11.5–15.5)
WBC: 4.3 10*3/uL (ref 4.0–10.5)
nRBC: 0 % (ref 0.0–0.2)

## 2021-06-07 LAB — D-DIMER, QUANTITATIVE: D-Dimer, Quant: 5.49 ug/mL-FEU — ABNORMAL HIGH (ref 0.00–0.50)

## 2021-06-07 LAB — PROCALCITONIN: Procalcitonin: 6.75 ng/mL

## 2021-06-07 LAB — RESP PANEL BY RT-PCR (FLU A&B, COVID) ARPGX2
Influenza A by PCR: NEGATIVE
Influenza B by PCR: NEGATIVE
SARS Coronavirus 2 by RT PCR: NEGATIVE

## 2021-06-07 LAB — LIPASE, BLOOD: Lipase: 48 U/L (ref 11–51)

## 2021-06-07 MED ORDER — IOHEXOL 300 MG/ML  SOLN
100.0000 mL | Freq: Once | INTRAMUSCULAR | Status: AC | PRN
  Administered 2021-06-07: 75 mL via INTRAVENOUS

## 2021-06-07 MED ORDER — SODIUM CHLORIDE 0.9 % IV SOLN
1.0000 g | Freq: Once | INTRAVENOUS | Status: AC
  Administered 2021-06-07: 1 g via INTRAVENOUS
  Filled 2021-06-07: qty 10

## 2021-06-07 MED ORDER — ENOXAPARIN SODIUM 40 MG/0.4ML IJ SOSY
40.0000 mg | PREFILLED_SYRINGE | INTRAMUSCULAR | Status: DC
  Administered 2021-06-08 – 2021-06-10 (×3): 40 mg via SUBCUTANEOUS
  Filled 2021-06-07 (×3): qty 0.4

## 2021-06-07 MED ORDER — ONDANSETRON HCL 4 MG/2ML IJ SOLN
4.0000 mg | Freq: Four times a day (QID) | INTRAMUSCULAR | Status: DC | PRN
Start: 2021-06-07 — End: 2021-06-10
  Administered 2021-06-07 – 2021-06-08 (×3): 4 mg via INTRAVENOUS
  Filled 2021-06-07 (×4): qty 2

## 2021-06-07 MED ORDER — ACETAMINOPHEN 325 MG PO TABS
650.0000 mg | ORAL_TABLET | Freq: Four times a day (QID) | ORAL | Status: DC | PRN
  Administered 2021-06-09 – 2021-06-10 (×2): 650 mg via ORAL
  Filled 2021-06-07 (×2): qty 2

## 2021-06-07 MED ORDER — ONDANSETRON HCL 4 MG PO TABS
4.0000 mg | ORAL_TABLET | Freq: Four times a day (QID) | ORAL | Status: DC | PRN
  Administered 2021-06-09: 4 mg via ORAL
  Filled 2021-06-07: qty 1

## 2021-06-07 MED ORDER — LACTATED RINGERS IV BOLUS
1000.0000 mL | Freq: Once | INTRAVENOUS | Status: AC
  Administered 2021-06-07: 1000 mL via INTRAVENOUS

## 2021-06-07 MED ORDER — POLYETHYLENE GLYCOL 3350 17 G PO PACK: 17.0000 g | PACK | Freq: Every day | ORAL | Status: DC | PRN

## 2021-06-07 MED ORDER — SODIUM CHLORIDE 0.9 % IV SOLN
500.0000 mg | INTRAVENOUS | Status: DC
  Administered 2021-06-08: 500 mg via INTRAVENOUS
  Filled 2021-06-07: qty 5

## 2021-06-07 MED ORDER — SODIUM CHLORIDE 0.9 % IV SOLN
500.0000 mg | INTRAVENOUS | Status: DC
  Filled 2021-06-07: qty 5

## 2021-06-07 MED ORDER — ACETAMINOPHEN 650 MG RE SUPP: 650.0000 mg | Freq: Four times a day (QID) | RECTAL | Status: DC | PRN

## 2021-06-07 MED ORDER — SODIUM CHLORIDE 0.9 % IV SOLN
500.0000 mg | Freq: Once | INTRAVENOUS | Status: DC
  Administered 2021-06-07: 500 mg via INTRAVENOUS
  Filled 2021-06-07: qty 5

## 2021-06-07 MED ORDER — LORAZEPAM 2 MG/ML IJ SOLN
1.0000 mg | Freq: Once | INTRAMUSCULAR | Status: AC
  Administered 2021-06-07: 1 mg via INTRAVENOUS
  Filled 2021-06-07: qty 1

## 2021-06-07 MED ORDER — PRAVASTATIN SODIUM 20 MG PO TABS
10.0000 mg | ORAL_TABLET | ORAL | Status: DC
  Administered 2021-06-08: 10 mg via ORAL
  Filled 2021-06-07: qty 1

## 2021-06-07 MED ORDER — ALBUTEROL SULFATE (2.5 MG/3ML) 0.083% IN NEBU
3.0000 mL | INHALATION_SOLUTION | RESPIRATORY_TRACT | Status: DC | PRN
  Administered 2021-06-07: 3 mL via RESPIRATORY_TRACT
  Filled 2021-06-07: qty 3

## 2021-06-07 MED ORDER — OXYCODONE-ACETAMINOPHEN 5-325 MG PO TABS
1.0000 | ORAL_TABLET | ORAL | Status: DC | PRN
Start: 2021-06-07 — End: 2021-06-10
  Administered 2021-06-07 – 2021-06-08 (×4): 1 via ORAL
  Filled 2021-06-07 (×4): qty 1

## 2021-06-07 MED ORDER — MORPHINE SULFATE (PF) 2 MG/ML IV SOLN: 2.0000 mg | INTRAVENOUS | Status: DC | PRN

## 2021-06-07 MED ORDER — SODIUM CHLORIDE 0.9 % IV SOLN
2.0000 g | INTRAVENOUS | Status: DC
  Administered 2021-06-08 – 2021-06-10 (×3): 2 g via INTRAVENOUS
  Filled 2021-06-07: qty 20
  Filled 2021-06-07: qty 2
  Filled 2021-06-07: qty 20

## 2021-06-07 NOTE — Assessment & Plan Note (Signed)
.   Continuing home regimen of lipid lowering therapy.  

## 2021-06-07 NOTE — Assessment & Plan Note (Addendum)
?   Patient presenting with cough, generalized malaise, weakness and nausea ?? Presenting chest x-ray and CT imaging reviewed ?? While in hospital, was conitnued on empiric azithromycin and rocephin. Pt to complete course of azithromycin and omnicef ?? Blood cx neg ?? Pt did report increased coughing with swallowing, seen by SLP and was cleared ?? Strep pneumo antigen pos in urine ?? COVID-19 testing negative. ? ?

## 2021-06-07 NOTE — ED Triage Notes (Addendum)
Patient c/o fever and nausea on Thursday. Reports could not do anything Friday, Saturday, or Sunday. Patient started to feel better but not herself Monday-Wednesday and then today reports her kidney pain is making her shake. C/o left flank pain that wraps around to left abdomen. Hx kidney stones. EMS administered fentanyl to patient before arrival.  ?

## 2021-06-07 NOTE — H&P (Signed)
?History and Physical  ? ? ?Patient: April Chapman MRN: [id number] DOA: 06/07/2021 ? ?Date of Service: the patient was seen and examined on 06/07/2021 ? ?Patient coming from: Home via EMS ? ?Chief Complaint:  ?Chief Complaint  ?Patient presents with  ? Flank Pain  ? Abdominal Pain  ? ? ?HPI:  ? ?71 year old female with past medical history of multiple myeloma (Dx 2011, S/P stem cell transplant 2012), Remote Hx DVT (2012 post transplant), hyperlipidemia who presented to [facility] emergency department with complaints of fevers, cough and left flank pain brought in by EMS. ? ?Patient explains that approximately 5 days ago she began to experience a cough.  Cough has been intermittently productive.  In the days that followed patient experienced a fever followed by intermittent chills generalized malaise and weakness.  Patient is also been experiencing poor oral intake and progressive nausea.  Patient also began to develop left flank pain, sharp in quality and severe in intensity, worse with movement and cough and improved with rest. ? ?Over the neck several days patient's symptoms progressively worsened with worsening weakness, cough and poor oral intake.  Due to progressively worsening symptoms EMS was contacted who promptly came to evaluate the patient and brought her in to [facility] emergency department for evaluation.  A dose of fentanyl was administered in route due to severe left flank pain. ? ?Upon evaluation in the emergency department the initial evaluation revealed no evidence of fever or leukocytosis.  COVID-19 PCR testing was found to be negative.  Urinalysis was also found to be unremarkable.  CT imaging of the abdomen pelvis revealed no M evidence of nephrolithiasis or pyelonephritis but did reveal left lower lobe infiltrate also seen on chest x-ray concerning for left lower lobe pneumonia.  Due to patient's severe left flank pain requiring opiate-based analgesics patient was initiated on intravenous ceftriaxone and  azithromycin in the hospital scrip was then called to assess the patient for admission to the hospital. ? ?Review of Systems: Review of Systems  ?Constitutional:  Positive for chills and fever.  ?Respiratory:  Positive for cough and sputum production.   ?Gastrointestinal:  Positive for nausea and vomiting.  ?Genitourinary:  Positive for flank pain.  ?All other systems reviewed and are negative. ? ? ?Past Medical History:  ?Diagnosis Date  ? Chronic kidney disease H/O  ? stones  ? History of chicken pox   ? Multiple myeloma ([place])   ? s/p stem cell transplant (08/2010)  ? ? ?Past Surgical History:  ?Procedure Laterality Date  ? APPENDECTOMY  03/2015  ? APPENDECTOMY    ? ? ?Social History:  reports that she has never smoked. She has never used smokeless tobacco. She reports that she does not drink alcohol and does not use drugs. ? ?Allergies  ?Allergen Reactions  ? Revlimid [Lenalidomide]   ? Pomalidomide Rash  ? ? ?Family History  ?Problem Relation Age of Onset  ? Mental illness Mother   ? Hypercholesterolemia Sister   ? Colon cancer Neg Hx   ? Breast cancer Neg Hx   ? ? ?Prior to Admission medications   ?Medication Sig Start Date End Date Taking? Authorizing Provider  ?pravastatin (PRAVACHOL) 10 MG tablet TAKE 1 TABLET BY MOUTH 3 TIMES A WEEK ON MONDAY, WEDNESDAY, FRIDAY ?Patient taking differently: Take 10 mg by mouth every Monday, Wednesday, and Friday. 05/14/21  Yes Einar Pheasant, MD  ?valACYclovir (VALTREX) 500 MG tablet Take 500 mg by mouth daily as needed (fever blisters).   Yes [provider]  ? ? ?  Physical Exam: ? ?Vitals:  ? 06/07/21 1731 06/07/21 1736 06/07/21 2143  ?BP: 108/60  (!) 129/58  ?Pulse: 91  84  ?Resp: 20  20  ?Temp: 99.1 ?F (37.3 ?C)  98.9 ?F (37.2 ?C)  ?TempSrc: Oral  Oral  ?SpO2: 94%  96%  ?Weight:  75.8 kg   ?Height:  5' 7.5" (1.715 m)   ? ? ?Constitutional: Awake alert and oriented x3, no associated distress.   ?Skin: no rashes, no lesions, poor skin turgor noted. ?Eyes: Pupils are  equally reactive to light.  No evidence of scleral icterus or conjunctival pallor.  ?ENMT: Dry mucous membranes noted.  Posterior pharynx clear of any exudate or lesions.   ?Neck: normal, supple, no masses, no thyromegaly.  No evidence of jugular venous distension.   ?Respiratory: Coarse breath sounds throughout the left lower and midlung fields with associated rhonchi.  No evidence of wheezing.   Normal respiratory effort. No accessory muscle use.  ?Cardiovascular: Regular rate and rhythm, no murmurs / rubs / gallops. No extremity edema. 2+ pedal pulses. No carotid bruits.  ?Chest:   Nontender without crepitus or deformity.   ?Back:   Notable tenderness along the left flank without crepitus or deformity. ?Abdomen: Abdomen is soft and nontender.  No evidence of intra-abdominal masses.  Positive bowel sounds noted in all quadrants.   ?Musculoskeletal: No joint deformity upper and lower extremities. Good ROM, no contractures. Normal muscle tone.  ?Neurologic: CN 2-12 grossly intact. Sensation intact.  Patient moving all 4 extremities spontaneously.  Patient is following all commands.  Patient is responsive to verbal stimuli.   ?Psychiatric: Patient exhibits normal mood with appropriate affect.  Patient seems to possess insight as to their current situation.   ? ?Data Reviewed: ? ?I have personally reviewed and interpreted labs, imaging. ? ?Significant findings are white blood cell count of 4.3, COVID-19 PCR testing unremarkable. ? ?Chest x-ray personally reviewed revealing notable infiltrate of the left lower lobe without associated effusion. ? ? ? ?Assessment and Plan: ?* Pneumonia of left lower lobe due to infectious organism ?Patient presenting with cough, generalized malaise, weakness and nausea ?Patient is suffering from left lower lobe pneumonia seen on chest x-ray and CT imaging ?Patient has been placed on broad spectrum antibiotics including azithromycin and ceftriaxone  ?Providing supplemental oxygen for any  associated hypoxia ?Providing PRN bronchodilator therapy for any associated wheezing ?Blood cultures obtained delayed based on these results. ?Strep urinary antigen testing ordered, Legionella urinary antigen testing ordered. ?COVID-19 testing negative. ? ? ?Acute left flank pain ?Patient seemingly experiencing severe left sided pleuritic pain related to identification of left lower lobe pneumonia ?Patient is already required several doses of opiate-based analgesics here in the emergency department ?Severity of symptoms is concerning.  Will obtain D-dimer to evaluate for possibility of underlying pulmonary embolism as the cause of the patient's symptoms and if elevated will proceed with CT angiogram of the chest. ?In the meantime, continuing as needed opiate-based analgesics ?Incentive spirometry ?Encourage ambulation ? ?Mixed hyperlipidemia ?Continuing home regimen of lipid lowering therapy. ? ? ?Multiple myeloma in remission (HCC) ?Longstanding known history of multiple myeloma  ?Diagnosis in 2011 ?Stem cell transplant in 2012 ?After multiple failed maintenance therapies over the years due to side effects, patient is currently simply in surveillance ?Continue outpatient follow-up with Dr. McKay with [facility] oncology ? ? ? ? ? ? ?Code Status:  Full code  code status decision has been confirmed with: patient ?Family Communication: deferred  ? ? ?Severity of Illness: ? ?  The appropriate patient status for this patient is OBSERVATION. Observation status is judged to be reasonable and necessary in order to provide the required intensity of service to ensure the patient's safety. The patient's presenting symptoms, physical exam findings, and initial radiographic and laboratory data in the context of their medical condition is felt to place them at decreased risk for further clinical deterioration. Furthermore, it is anticipated that the patient will be medically stable for discharge from the hospital within 2 midnights of  admission.  ? ?Author: ? ?George J Shalhoub MD ? ?06/07/2021 10:04 PM ?

## 2021-06-07 NOTE — Plan of Care (Signed)
?  Problem: Respiratory: ?Goal: Ability to maintain adequate ventilation will improve ?Outcome: Not Progressing ?  ?Problem: Clinical Measurements: ?Goal: Ability to maintain a body temperature in the normal range will improve ?Outcome: Not Progressing ?  ?Problem: Activity: ?Goal: Ability to tolerate increased activity will improve ?Outcome: Not Progressing ?  ?Problem: Pain Managment: ?Goal: General experience of comfort will improve ?Outcome: Not Progressing ?  ?Problem: Safety: ?Goal: Ability to remain free from injury will improve ?Outcome: Not Progressing ?  ?

## 2021-06-07 NOTE — ED Provider Notes (Signed)
? ?[facility] ?Provider Note ? ? ? Event Date/Time  ? First MD Initiated Contact with Patient 06/07/21 1840   ?  (approximate) ? ? ?History  ? ?Chief Complaint ?Flank Pain and Abdominal Pain ? ? ?HPI ? ?April Chapman is a 71 y.o. female with past medical history of multiple myeloma and anemia who presents to the ED complaining of flank pain.  Patient reports that she has been dealing with a productive cough for the past 3 to 4 days, denies fevers, chest pain, shortness of breath.  She reports increasing chills developing over the course of today along with onset of severe pain in her left flank.  She states that "my kidney is really hurting me."  She denies any associated nausea or vomiting, is concerned that she could have a UTI but denies any dysuria or hematuria.  She reports similar symptoms in the past and endorses history of kidney stones. ?  ? ? ?Physical Exam  ? ?Triage Vital Signs: ?ED Triage Vitals  ?Enc Vitals Group  ?   BP 06/07/21 1731 108/60  ?   Pulse Rate 06/07/21 1731 91  ?   Resp 06/07/21 1731 20  ?   Temp 06/07/21 1731 99.1 ?F (37.3 ?C)  ?   Temp Source 06/07/21 1731 Oral  ?   SpO2 06/07/21 1731 94 %  ?   Weight 06/07/21 1736 167 lb (75.8 kg)  ?   Height 06/07/21 1736 5' 7.5" (1.715 m)  ?   Head Circumference --   ?   Peak Flow --   ?   Pain Score 06/07/21 1736 3  ?   Pain Loc --   ?   Pain Edu? --   ?   Excl. in [place]? --   ? ? ?Most recent vital signs: ?Vitals:  ? 06/07/21 1731  ?BP: 108/60  ?Pulse: 91  ?Resp: 20  ?Temp: 99.1 ?F (37.3 ?C)  ?SpO2: 94%  ? ? ?Constitutional: Alert and oriented. ?Eyes: Conjunctivae are normal. ?Head: Atraumatic. ?Nose: No congestion/rhinnorhea. ?Mouth/Throat: Mucous membranes are moist.  ?Cardiovascular: Normal rate, regular rhythm. Grossly normal heart sounds.  2+ radial pulses bilaterally. ?Respiratory: Normal respiratory effort.  No retractions. Lungs CTAB. ?Gastrointestinal: Soft and tender to palpation in the left upper quadrant with no  rebound or guarding.  Left CVA tenderness noted.  No distention. ?Musculoskeletal: No lower extremity tenderness nor edema.  ?Neurologic:  Normal speech and language. No gross focal neurologic deficits are appreciated. ? ? ? ?ED Results / Procedures / Treatments  ? ?Labs ?(all labs ordered are listed, but only abnormal results are displayed) ?Labs Reviewed  ?URINALYSIS, ROUTINE W REFLEX MICROSCOPIC - Abnormal; Notable for the following components:  ?    Result Value  ? Color, Urine STRAW (*)   ? APPearance CLEAR (*)   ? Hgb urine dipstick MODERATE (*)   ? All other components within normal limits  ?BASIC METABOLIC PANEL - Abnormal; Notable for the following components:  ? Glucose, Bld 143 (*)   ? All other components within normal limits  ?CBC - Abnormal; Notable for the following components:  ? Platelets 129 (*)   ? All other components within normal limits  ?RESP PANEL BY RT-PCR (FLU A&B, COVID) [id number]  ?HEPATIC FUNCTION PANEL  ?LIPASE, BLOOD  ? ? ?RADIOLOGY ?Chest x-ray reviewed by me with left lower lobe infiltrate concerning for pneumonia. ? ?PROCEDURES: ? ?Critical Care performed: No ? ?Procedures ? ? ?MEDICATIONS ORDERED IN ED: ?Medications  ?  cefTRIAXone (ROCEPHIN) 1 g in sodium chloride 0.9 % 100 mL IVPB (has no administration in time range)  ?azithromycin (ZITHROMAX) 500 mg in sodium chloride 0.9 % 250 mL IVPB (has no administration in time range)  ?LORazepam (ATIVAN) injection 1 mg (1 mg Intravenous Given 06/07/21 1950)  ?lactated ringers bolus 1,000 mL (1,000 mLs Intravenous New Bag/Given 06/07/21 1950)  ?iohexol (OMNIPAQUE) 300 MG/ML solution 100 mL (75 mLs Intravenous Contrast Given 06/07/21 1936)  ? ? ? ?IMPRESSION / MDM / ASSESSMENT AND PLAN / ED COURSE  ?I reviewed the triage vital signs and the nursing notes. ?             ?               ? ?71 y.o. female with past medical history of multiple myeloma and anemia who presents to the ED complaining of 3 to 4 days of productive cough now associated with  severe pain in her left flank. ? ?Differential diagnosis includes, but is not limited to, kidney stone, pyelonephritis, cystitis, gastritis, pancreatitis, bowel obstruction, pneumonia. ? ?Patient nontoxic-appearing and in no acute distress, pain is reproducible with palpation of her left upper quadrant as well as her left CVA area.  We will further assess with CT scan, labs thus far are reassuring with CBC showing no anemia or leukocytosis, BMP without electrolyte abnormality or AKI.  UA does not appear consistent with infection.  Patient does report significant anxiety and we will treat symptomatically with IV Ativan. ? ?Chest x-ray appears consistent with pneumonia, CT of abdomen/pelvis shows no intra-abdominal process but redemonstrates evidence of left lower lobe pneumonia.  Additional labs are reassuring, LFTs and lipase are within normal limits.  We will treat with IV Rocephin and azithromycin, patient reporting significant weakness at this time and plan to discuss with hospitalist for admission given her advanced age. ? ?  ? ? ?FINAL CLINICAL IMPRESSION(S) / ED DIAGNOSES  ? ?Final diagnoses:  ?Community acquired pneumonia of left lower lobe of lung  ?Generalized weakness  ? ? ? ?Rx / DC Orders  ? ?ED Discharge Orders   ? ? None  ? ?  ? ? ? ?Note:  This document was prepared using Dragon voice recognition software and may include unintentional dictation errors. ?  ?Blake Divine, MD ?06/07/21 2030 ? ?

## 2021-06-07 NOTE — Assessment & Plan Note (Signed)
?  Longstanding known history of multiple myeloma  ?? Diagnosis in 2011 ?? Stem cell transplant in 2012 ?? After multiple failed maintenance therapies over the years due to side effects, patient is currently simply in surveillance ?? Continue outpatient follow-up with Dr. Feliciana Rossetti with [facility] oncology ?

## 2021-06-07 NOTE — Assessment & Plan Note (Addendum)
?   Patient seemingly experiencing severe left sided pleuritic pain related to identification of left lower lobe pneumonia ?? CTA chest reviewed, neg for PE.  ?? Cont analgesia as needed ?

## 2021-06-08 ENCOUNTER — Observation Stay: Payer: Medicare Other

## 2021-06-08 DIAGNOSIS — Z86718 Personal history of other venous thrombosis and embolism: Secondary | ICD-10-CM | POA: Diagnosis not present

## 2021-06-08 DIAGNOSIS — C9001 Multiple myeloma in remission: Secondary | ICD-10-CM | POA: Diagnosis present

## 2021-06-08 DIAGNOSIS — E876 Hypokalemia: Secondary | ICD-10-CM | POA: Diagnosis not present

## 2021-06-08 DIAGNOSIS — J13 Pneumonia due to Streptococcus pneumoniae: Secondary | ICD-10-CM | POA: Diagnosis present

## 2021-06-08 DIAGNOSIS — R918 Other nonspecific abnormal finding of lung field: Secondary | ICD-10-CM | POA: Diagnosis not present

## 2021-06-08 DIAGNOSIS — Z9484 Stem cells transplant status: Secondary | ICD-10-CM | POA: Diagnosis not present

## 2021-06-08 DIAGNOSIS — R531 Weakness: Secondary | ICD-10-CM

## 2021-06-08 DIAGNOSIS — Z888 Allergy status to other drugs, medicaments and biological substances status: Secondary | ICD-10-CM | POA: Diagnosis not present

## 2021-06-08 DIAGNOSIS — Z20822 Contact with and (suspected) exposure to covid-19: Secondary | ICD-10-CM | POA: Diagnosis present

## 2021-06-08 DIAGNOSIS — Z818 Family history of other mental and behavioral disorders: Secondary | ICD-10-CM | POA: Diagnosis not present

## 2021-06-08 DIAGNOSIS — I700 Atherosclerosis of aorta: Secondary | ICD-10-CM | POA: Diagnosis not present

## 2021-06-08 DIAGNOSIS — R7989 Other specified abnormal findings of blood chemistry: Secondary | ICD-10-CM

## 2021-06-08 DIAGNOSIS — E782 Mixed hyperlipidemia: Secondary | ICD-10-CM | POA: Diagnosis present

## 2021-06-08 DIAGNOSIS — E871 Hypo-osmolality and hyponatremia: Secondary | ICD-10-CM | POA: Diagnosis not present

## 2021-06-08 DIAGNOSIS — R109 Unspecified abdominal pain: Secondary | ICD-10-CM | POA: Diagnosis present

## 2021-06-08 DIAGNOSIS — Z8349 Family history of other endocrine, nutritional and metabolic diseases: Secondary | ICD-10-CM | POA: Diagnosis not present

## 2021-06-08 DIAGNOSIS — E860 Dehydration: Secondary | ICD-10-CM | POA: Diagnosis not present

## 2021-06-08 DIAGNOSIS — J189 Pneumonia, unspecified organism: Secondary | ICD-10-CM | POA: Diagnosis not present

## 2021-06-08 DIAGNOSIS — I77810 Thoracic aortic ectasia: Secondary | ICD-10-CM | POA: Diagnosis not present

## 2021-06-08 DIAGNOSIS — Z79899 Other long term (current) drug therapy: Secondary | ICD-10-CM | POA: Diagnosis not present

## 2021-06-08 LAB — COMPREHENSIVE METABOLIC PANEL
ALT: 12 U/L (ref 0–44)
AST: 18 U/L (ref 15–41)
Albumin: 2.8 g/dL — ABNORMAL LOW (ref 3.5–5.0)
Alkaline Phosphatase: 34 U/L — ABNORMAL LOW (ref 38–126)
Anion gap: 8 (ref 5–15)
BUN: 16 mg/dL (ref 8–23)
CO2: 24 mmol/L (ref 22–32)
Calcium: 7.9 mg/dL — ABNORMAL LOW (ref 8.9–10.3)
Chloride: 102 mmol/L (ref 98–111)
Creatinine, Ser: 0.81 mg/dL (ref 0.44–1.00)
GFR, Estimated: >60 mL/min (ref >60)
Glucose, Bld: 123 mg/dL — ABNORMAL HIGH (ref 70–99)
Potassium: 3.3 mmol/L — ABNORMAL LOW (ref 3.5–5.1)
Sodium: 134 mmol/L — ABNORMAL LOW (ref 135–145)
Total Bilirubin: 0.9 mg/dL (ref 0.3–1.2)
Total Protein: 5.9 g/dL — ABNORMAL LOW (ref 6.5–8.1)

## 2021-06-08 LAB — EXPECTORATED SPUTUM ASSESSMENT W GRAM STAIN, RFLX TO RESP C

## 2021-06-08 LAB — CBC WITH DIFFERENTIAL/PLATELET
Abs Immature Granulocytes: 0.08 10*3/uL — ABNORMAL HIGH (ref 0.00–0.07)
Basophils Absolute: 0 10*3/uL (ref 0.0–0.1)
Basophils Relative: 0 %
Eosinophils Absolute: 0 10*3/uL (ref 0.0–0.5)
Eosinophils Relative: 0 %
HCT: 30.2 % — ABNORMAL LOW (ref 36.0–46.0)
Hemoglobin: 10 g/dL — ABNORMAL LOW (ref 12.0–15.0)
Immature Granulocytes: 1 %
Lymphocytes Relative: 12 %
Lymphs Abs: 1 10*3/uL (ref 0.7–4.0)
MCH: 29.2 pg (ref 26.0–34.0)
MCHC: 33.1 g/dL (ref 30.0–36.0)
MCV: 88 fL (ref 80.0–100.0)
Monocytes Absolute: 1.1 10*3/uL — ABNORMAL HIGH (ref 0.1–1.0)
Monocytes Relative: 13 %
Neutro Abs: 6.3 10*3/uL (ref 1.7–7.7)
Neutrophils Relative %: 74 %
Platelets: 109 10*3/uL — ABNORMAL LOW (ref 150–400)
RBC: 3.43 MIL/uL — ABNORMAL LOW (ref 3.87–5.11)
RDW: 13.2 % (ref 11.5–15.5)
Smear Review: NORMAL
WBC Morphology: INCREASED
WBC: 8.5 10*3/uL (ref 4.0–10.5)
nRBC: 0 % (ref 0.0–0.2)

## 2021-06-08 LAB — MAGNESIUM: Magnesium: 1.5 mg/dL — ABNORMAL LOW (ref 1.7–2.4)

## 2021-06-08 LAB — C-REACTIVE PROTEIN: CRP: 2.3 mg/dL — ABNORMAL HIGH (ref <1.0)

## 2021-06-08 LAB — STREP PNEUMONIAE URINARY ANTIGEN: Strep Pneumo Urinary Antigen: POSITIVE — AB

## 2021-06-08 LAB — HIV ANTIBODY (ROUTINE TESTING W REFLEX): HIV Screen 4th Generation wRfx: NONREACTIVE

## 2021-06-08 MED ORDER — POTASSIUM CHLORIDE 10 MEQ/100ML IV SOLN
10.0000 meq | INTRAVENOUS | Status: AC
  Administered 2021-06-08 (×2): 10 meq via INTRAVENOUS
  Filled 2021-06-08 (×2): qty 100

## 2021-06-08 MED ORDER — POTASSIUM CHLORIDE CRYS ER 20 MEQ PO TBCR
40.0000 meq | EXTENDED_RELEASE_TABLET | Freq: Once | ORAL | Status: AC
  Administered 2021-06-08: 40 meq via ORAL
  Filled 2021-06-08: qty 2

## 2021-06-08 MED ORDER — MAGNESIUM SULFATE 4 GM/100ML IV SOLN
4.0000 g | Freq: Once | INTRAVENOUS | Status: AC
Start: 2021-06-08 — End: 2021-06-08
  Administered 2021-06-08: 4 g via INTRAVENOUS
  Filled 2021-06-08: qty 100

## 2021-06-08 MED ORDER — SODIUM CHLORIDE 0.9 % IV SOLN: INTRAVENOUS | Status: AC

## 2021-06-08 MED ORDER — IOHEXOL 350 MG/ML SOLN
75.0000 mL | Freq: Once | INTRAVENOUS | Status: AC | PRN
  Administered 2021-06-08: 75 mL via INTRAVENOUS

## 2021-06-08 NOTE — Hospital Course (Signed)
71 year old female with past medical history of multiple myeloma (Dx 2011, S/P stem cell transplant 2012), Remote Hx DVT (2012 post transplant), hyperlipidemia who presented to [facility] emergency department with complaints of fevers, cough and left flank pain brought in by EMS. ?  ?Patient explains that approximately 5 days ago she began to experience a cough.  Cough has been intermittently productive.  In the days that followed patient experienced a fever followed by intermittent chills generalized malaise and weakness.  Patient is also been experiencing poor oral intake and progressive nausea.  Patient also began to develop left flank pain, sharp in quality and severe in intensity, worse with movement and cough and improved with rest. ?  ?Over the neck several days patient's symptoms progressively worsened with worsening weakness, cough and poor oral intake.  Due to progressively worsening symptoms EMS was contacted who promptly came to evaluate the patient and brought her in to [facility] emergency department for evaluation ? ?Work up was notable for L sided PNA. Patient was started on azithromycin and rocephin. Hospitalist consulted for consideration for admission ?

## 2021-06-08 NOTE — Progress Notes (Signed)
Cross Cover ?Mag and potassium supplemented ?

## 2021-06-08 NOTE — Assessment & Plan Note (Addendum)
Replaced. °

## 2021-06-08 NOTE — Progress Notes (Signed)
PT Cancellation Note ? ?Patient Details ?Name: April Chapman ?MRN: [id number] ?DOB: 11-Nov-1950 ? ? ?Cancelled Treatment:    Reason Eval/Treat Not Completed: Other (comment).  Chart reviewed and attempted to see pt.  Pt just received lunch and requesting for therapist to come back at later time.  Will re-attempt evaluation as medically appropriate at later date/time. ? ? ?Gwenlyn Saran, PT, DPT ?06/08/21, 12:48 PM ? ?

## 2021-06-08 NOTE — Evaluation (Signed)
Physical Therapy Evaluation ?Patient Details ?Name: April Chapman ?MRN: [id number] ?DOB: June 03, 1950 ?Today's Date: 06/08/2021 ? ?History of Present Illness ? Pt is 71 year old female with past medical history of multiple myeloma (Dx 2011, S/P stem cell transplant 2012), Remote Hx DVT (2012 post transplant), hyperlipidemia who presented to [facility] emergency department with complaints of fevers, cough and left flank pain brought in by EMS. ? ?  ?Clinical Impression ? Pt received in supine position and agreeable to therapy.  Pt with good bed mobility and able to come upright without any specific deficits.  Pt also able to perform bed-level exercises without any difficulty.  Pt then performed transfer to standing in which she felt "swimmy-headed" and was able to allow that feeling to subside before continuing.  Pt then ambulated with no AD to the nursing station where pt began feeling extremely nauseous, requesting an emesis bag.  Pt never vomited, but did cough up phlegm.  Pt then transferred back to room and returned to supine position with LE's elevated.  Nursing in room giving nausea medication upon leaving the room.  Current discharge plans to home with HHPT are appropriate at this time.  Pt likely to transition to d/c home without PT being necessary, but due to medical complications at this time, would be best served by having HHPT.  Pt will continue to benefit from skilled therapy in order to address deficits listed below. ? ?   ?   ? ?Recommendations for follow up therapy are one component of a multi-disciplinary discharge planning process, led by the attending physician.  Recommendations may be updated based on patient status, additional functional criteria and insurance authorization. ? ?Follow Up Recommendations Home health PT ? ?  ?Assistance Recommended at Discharge PRN  ?Patient can return home with the following ?   ? ?  ?Equipment Recommendations None recommended by PT  ?Recommendations for Other Services ?     ?  ?Functional Status Assessment Patient has had a recent decline in their functional status and demonstrates the ability to make significant improvements in function in a reasonable and predictable amount of time.  ? ?  ?Precautions / Restrictions Precautions ?Precautions: None ?Restrictions ?Weight Bearing Restrictions: No  ? ?  ? ?Mobility ? Bed Mobility ?Overal bed mobility: Modified Independent ?  ?  ?  ?  ?  ?  ?  ?  ? ?Transfers ?Overall transfer level: Modified independent ?Equipment used: None ?  ?  ?  ?  ?  ?  ?  ?  ?  ? ?Ambulation/Gait ?Ambulation/Gait assistance: Min guard ?Gait Distance (Feet): 115 Feet ?Assistive device: None ?Gait Pattern/deviations: WFL(Within Functional Limits) ?Gait velocity: decreased ?  ?  ?General Gait Details: Pt with good and steady gait until she started feeling nauseous and requesting an emesis bag. ? ?Stairs ?  ?  ?  ?  ?  ? ?Wheelchair Mobility ?  ? ?Modified Rankin (Stroke Patients Only) ?  ? ?  ? ?Balance Overall balance assessment: Mild deficits observed, not formally tested ?  ?  ?  ?  ?  ?  ?  ?  ?  ?  ?  ?  ?  ?  ?  ?  ?  ?  ?   ? ? ? ?Pertinent Vitals/Pain Pain Assessment ?Pain Assessment: No/denies pain  ? ? ?Home Living Family/patient expects to be discharged to:: Private residence ?Living Arrangements: Alone (with randkid) ?Available Help at Discharge: Family;Friend(s);Available PRN/intermittently ?Type of Home: House ?Home Access: Stairs  to enter ?Entrance Stairs-Rails: Left ?Entrance Stairs-Number of Steps: 4 ?  ?Home Layout: One level ?Home Equipment: None ?   ?  ?Prior Function Prior Level of Function : Independent/Modified Independent ?  ?  ?  ?  ?  ?  ?  ?  ?  ? ? ?Hand Dominance  ? Dominant Hand: Right ? ?  ?Extremity/Trunk Assessment  ? Upper Extremity Assessment ?Upper Extremity Assessment: Generalized weakness ?  ? ?Lower Extremity Assessment ?Lower Extremity Assessment: Generalized weakness ?  ? ?   ?Communication  ? Communication: No difficulties   ?Cognition Arousal/Alertness: Awake/alert ?Behavior During Therapy: [facility] for tasks assessed/performed ?Overall Cognitive Status: Within Functional Limits for tasks assessed ?  ?  ?  ?  ?  ?  ?  ?  ?  ?  ?  ?  ?  ?  ?  ?  ?  ?  ?  ? ?  ?General Comments   ? ?  ?Exercises Total Joint Exercises ?Ankle Circles/Pumps: AROM, Strengthening, Both, 10 reps, Supine ?Quad Sets: AROM, Strengthening, Both, 10 reps, Supine ?Gluteal Sets: AROM, Strengthening, Both, 10 reps, Supine ?Hip ABduction/ADduction: AROM, Strengthening, Both, 10 reps, Supine ?Straight Leg Raises: AROM, Strengthening, Both, 10 reps, Supine ?Long Arc Quad: AROM, Strengthening, Both, 10 reps, Supine ?Marching in Standing: AROM, Strengthening, Both, 10 reps, Standing  ? ?Assessment/Plan  ?  ?PT Assessment Patient needs continued PT services  ?PT Problem List Decreased strength;Decreased activity tolerance;Decreased balance;Decreased mobility;Decreased knowledge of use of DME;Decreased safety awareness ? ?   ?  ?PT Treatment Interventions DME instruction;Gait training;Stair training;Functional mobility training;Therapeutic activities;Therapeutic exercise;Balance training;Neuromuscular re-education   ? ?PT Goals (Current goals can be found in the Care Plan section)  ?Acute Rehab PT Goals ?Patient Stated Goal: to go home. ?PT Goal Formulation: With patient ?Time For Goal Achievement: 06/22/21 ?Potential to Achieve Goals: Good ? ?  ?Frequency Min 2X/week ?  ? ? ?Co-evaluation   ?  ?  ?  ?  ? ? ?  ?AM-PAC PT "6 Clicks" Mobility  ?Outcome Measure Help needed turning from your back to your side while in a flat bed without using bedrails?: A Little ?Help needed moving from lying on your back to sitting on the side of a flat bed without using bedrails?: A Little ?Help needed moving to and from a bed to a chair (including a wheelchair)?: A Little ?Help needed standing up from a chair using your arms (e.g., wheelchair or bedside chair)?: A Little ?Help needed to walk in  hospital room?: A Little ?Help needed climbing 3-5 steps with a railing? : A Lot ?6 Click Score: 17 ? ?  ?End of Session Equipment Utilized During Treatment: Gait belt ?Activity Tolerance: Patient tolerated treatment well ?Patient left: in bed;with call bell/phone within reach;with nursing/sitter in room ?Nurse Communication: Mobility status;Other (comment) (Pt requesting nausea medication) ?PT Visit Diagnosis: Unsteadiness on feet (R26.81);Repeated falls (R29.6);Muscle weakness (generalized) (M62.81);History of falling (Z91.81);Difficulty in walking, not elsewhere classified (R26.2) ?  ? ?Time: 7322-0254 ?PT Time Calculation (min) (ACUTE ONLY): 44 min ? ? ?Charges:   PT Evaluation ?$PT Eval Low Complexity: 1 Low ?PT Treatments ?$Gait Training: 8-22 mins ?$Therapeutic Exercise: 8-22 mins ?  ?   ? ? ?Gwenlyn Saran, PT, DPT ?06/08/21, 4:52 PM ? ? ?Christie Nottingham ?06/08/2021, 4:48 PM ? ?

## 2021-06-08 NOTE — Assessment & Plan Note (Signed)
-  Presented with d-dimer over 5 ?-CTA reviewed, neg for PE ?-Ordered and reviewed BLE dopplers, neg for DVT ?

## 2021-06-08 NOTE — Evaluation (Signed)
Clinical/Bedside Swallow Evaluation ?Patient Details  ?Name: April Chapman ?MRN: [id number] ?Date of Birth: 07-30-1950 ? ?Today's Date: 06/08/2021 ?Time: SLP Start Time (ACUTE ONLY): 9163 SLP Stop Time (ACUTE ONLY): 1625 ?SLP Time Calculation (min) (ACUTE ONLY): 40 min ? ?Past Medical History:  ?Past Medical History:  ?Diagnosis Date  ? Chronic kidney disease H/O  ? stones  ? History of chicken pox   ? Multiple myeloma ([place])   ? s/p stem cell transplant (08/2010)  ? ?Past Surgical History:  ?Past Surgical History:  ?Procedure Laterality Date  ? APPENDECTOMY  03/2015  ? APPENDECTOMY    ? ?HPI:  ?Pt is a 71 year old female with past medical history of multiple myeloma (Dx 2011, S/P stem cell transplant 2012), Remote Hx DVT (2012 post transplant), hyperlipidemia who presented to [facility] emergency department with complaints of fevers, cough and left flank pain brought in by EMS.   Patient explains that approximately 5 days ago she began to experience a cough.  Cough has been intermittently productive.  In the days that followed patient experienced a fever followed by intermittent chills generalized malaise and weakness.  Patient is also been experiencing poor oral intake and progressive nausea.   CXR: L sided PNA.  CT of Chest: There is dense patchy consolidation in the left lower  lobe primarily in the posterior and medial basal segments but also  involving portions of the superior segment and anterior basal  segment. Fluid and debris partially fill the left lower lobe main  bronchus with small bronchial impactions in the consolidated areas.  There is patchy, ill-defined nonlocalized ground-glass opacity in  the right upper lobe anterior and posterior segments in keeping with additional pneumonitis.  ?  ?Assessment / Plan / Recommendation  ?Clinical Impression ? Pt seen for BSE today. She was A/O x4; good sense of humor. Appeared impacted by deconditioned status and declined Pulmonary status w/ semi-productive cough. Pt  often stated she felt "terrible" since getting the "flu and now pneumonia". Noted a dry, semi-productive cough as she talked and moved about to sit EOB independently PRIOR to po's. Pt denied any h/o swallowing difficulties; on RA w/ WBC wnl.  ?  ?Pt appears to present w/ adequate oropharyngeal phase swallow function w/ No oropharyngeal phase dysphagia noted, No neuromuscular deficits noted. Pt consumed po trials w/ No overt, clinical s/s of aspiration during po trials. Pt appears at reduced risk for aspiration following general aspiration precautions. Suspect impact of declined Pulmonary status and current, lengthy illness is impacting her Stamina for tasks including po tasks(motor fx demand, pulmonary demand, and desire for oral intake which pt stated had lessened too).   ? ?During po trials, pt consumed all consistencies feeding self w/ no immediate, overt coughing, decline in vocal quality, or change in respiratory presentation during/post trials. No decline in O2 sats. Oral phase appeared [facility] w/ timely bolus management, mastication, and control of bolus propulsion for A-P transfer for swallowing. Oral clearing achieved w/ all trial consistencies. OM Exam appeared [facility] w/ no unilateral weakness noted. Speech Clear.  ? ?Recommend continue a Regular consistency diet w/ well-Cut meats, moistened foods; Thin liquids. Recommend general aspiration precautions, Rest Breaks during meals and easy to eat foods for conservation of energy. Pills swallowed w/ liquids 1-2 at a time vs whole in Puree if needed for easier swallowing. Education given on Pills in Puree; food consistencies and easy to eat options; general aspiration precautions. NSG to reconsult if any new needs arise. MD/NSG updated. ?OF NOTE: encouraged  pt to ask her Nurse/MD re: a medicine that could help break up the phlegm she feels she "cannot cough all the way up".  ?SLP Visit Diagnosis: Dysphagia, unspecified (R13.10) ?   ?Aspiration Risk ? No limitations  ?   ?Diet Recommendation   Regular consistency diet w/ well-Cut meats, moistened foods; Thin liquids. Recommend general aspiration precautions, Rest Breaks during meals and easy to eat foods for conservation of energy.  ? ?Medication Administration: Whole meds with liquid - Pills swallowed w/ liquids 1-2 at a time vs whole in Puree if needed for easier swallowing. ?  ?Other  Recommendations Recommended Consults:  (Dietician f/u if needed for support) ?Oral Care Recommendations: Oral care BID;Oral care before and after PO;Patient independent with oral care ?Other Recommendations:  (n/a)   ? ?Recommendations for follow up therapy are one component of a multi-disciplinary discharge planning process, led by the attending physician.  Recommendations may be updated based on patient status, additional functional criteria and insurance authorization. ? ?Follow up Recommendations No SLP follow up  ? ? ?  ?Assistance Recommended at Discharge None  ?Functional Status Assessment Patient has not had a recent decline in their functional status (no decline in swallowing status apparent)  ?Frequency and Duration  (n/a)  ? (n/a) ?  ?   ? ?Prognosis Prognosis for Safe Diet Advancement: Good ?Barriers to Reach Goals:  (deconditioning)  ? ?  ? ?Swallow Study   ?General Date of Onset: 06/07/21 ?HPI: Pt is a 71 year old female with past medical history of multiple myeloma (Dx 2011, S/P stem cell transplant 2012), Remote Hx DVT (2012 post transplant), hyperlipidemia who presented to [facility] emergency department with complaints of fevers, cough and left flank pain brought in by EMS.   Patient explains that approximately 5 days ago she began to experience a cough.  Cough has been intermittently productive.  In the days that followed patient experienced a fever followed by intermittent chills generalized malaise and weakness.  Patient is also been experiencing poor oral intake and progressive nausea.   CXR: L sided PNA.  CT of Chest: There is  dense patchy consolidation in the left lower  lobe primarily in the posterior and medial basal segments but also  involving portions of the superior segment and anterior basal  segment. Fluid and debris partially fill the left lower lobe main  bronchus with small bronchial impactions in the consolidated areas.  There is patchy, ill-defined nonlocalized ground-glass opacity in  the right upper lobe anterior and posterior segments in keeping with additional pneumonitis. ?Type of Study: Bedside Swallow Evaluation ?Previous Swallow Assessment: none ?Diet Prior to this Study: Regular;Thin liquids ?Temperature Spikes Noted: No (wbc 8.5) ?Respiratory Status: Room air ?History of Recent Intubation: No ?Behavior/Cognition: Alert;Cooperative;Pleasant mood (good sense of humor) ?Oral Cavity Assessment: Within Functional Limits ?Oral Care Completed by SLP: Recent completion by staff ?Oral Cavity - Dentition: Dentures, top;Dentures, bottom ?Vision: Functional for self-feeding ?Self-Feeding Abilities: Able to feed self ?Patient Positioning: Upright in bed (sat EOB independently) ?Baseline Vocal Quality: Normal (min gravely d/t coughing) ?Volitional Cough: Strong (semmi-productive) ?Volitional Swallow: Able to elicit  ?  ?Oral/Motor/Sensory Function Overall Oral Motor/Sensory Function: Within functional limits   ?Ice Chips Ice chips: Not tested   ?Thin Liquid Thin Liquid: Within functional limits ?Presentation: Self Fed;Straw (~5-6 ozs via Straw) ?Other Comments: encouraged rest breaks to monitor breathing  ?  ?Nectar Thick Nectar Thick Liquid: Not tested   ?Honey Thick Honey Thick Liquid: Not tested   ?Puree Puree: Within functional  limits ?Presentation: Self Fed;Spoon (2 trials)   ?Solid ? ? ?  Solid: Within functional limits ?Presentation: Self Fed (4 trials)  ? ?  ? ? ? ?Orinda Kenner, MS, CCC-SLP ?Speech Language Pathologist ?Rehab Services; [place] ?[phone] (ascom) ?Treyce Spillers ?06/08/2021,4:25  PM ? ? ? ?

## 2021-06-08 NOTE — Progress Notes (Signed)
?Progress Note ? ? ?Patient: April Chapman MBE:[id number] DOB: [birth year] DOA: 06/07/2021     0 ?DOS: the patient was seen and examined on 06/08/2021 ?  ?Brief hospital course: ?71 year old female with past medical history of multiple myeloma (Dx 2011, S/P stem cell transplant 2012), Remote Hx DVT (2012 post transplant), hyperlipidemia who presented to [facility] emergency department with complaints of fevers, cough and left flank pain brought in by EMS. ?  ?Patient explains that approximately 5 days ago she began to experience a cough.  Cough has been intermittently productive.  In the days that followed patient experienced a fever followed by intermittent chills generalized malaise and weakness.  Patient is also been experiencing poor oral intake and progressive nausea.  Patient also began to develop left flank pain, sharp in quality and severe in intensity, worse with movement and cough and improved with rest. ?  ?Over the neck several days patient's symptoms progressively worsened with worsening weakness, cough and poor oral intake.  Due to progressively worsening symptoms EMS was contacted who promptly came to evaluate the patient and brought her in to [facility] emergency department for evaluation ? ?Work up was notable for L sided PNA. Patient was started on azithromycin and rocephin. Hospitalist consulted for consideration for admission ? ?Assessment and Plan: ?* Streptococcus pneumoniae pneumonia ([employer]) ?Patient presenting with cough, generalized malaise, weakness and nausea ?Presenting chest x-ray and CT imaging reviewed ?Continue on empiric azithromycin and rocephin, anticipate total 5 days tx ?Providing supplemental oxygen for any associated hypoxia ?F/u on blood cultures  ?Pt did report increased coughing with swallowing, thus have consulted SLP for eval ?Strep pneumo antigen pos in urine ?COVID-19 testing negative. ? ? ?Acute left flank pain ?Patient seemingly experiencing severe left sided pleuritic pain related  to identification of left lower lobe pneumonia ?CTA chest reviewed, neg for PE.  ?Cont analgesia as needed ? ?Mixed hyperlipidemia ?Continuing home regimen of lipid lowering therapy. ? ? ?Multiple myeloma in remission ([place]) ?Longstanding known history of multiple myeloma  ?Diagnosis in 2011 ?Stem cell transplant in 2012 ?After multiple failed maintenance therapies over the years due to side effects, patient is currently simply in surveillance ?Continue outpatient follow-up with Dr. Feliciana Rossetti with [facility] oncology ? ?Hyponatremia ?-did appear somewhat dehydrated on exam ?-Pt has received IVF ?-Repeat bmet in AM ? ?Hypomagnesemia ?-Replaced ?-Cont to follow lytes and replace as needed ? ?Hypokalemia ?-Replaced ?-Repeat bmet in AM ? ?Positive D dimer ?-Presented with d-dimer over 5 ?-CTA reviewed, neg for PE ?-Ordered and reviewed BLE dopplers, neg for DVT ? ? ? ? ?  ? ?Subjective: Cont to complain of coughing ? ?Physical Exam: ?Vitals:  ? 06/07/21 2143 06/07/21 2320 06/08/21 0307 06/08/21 0748  ?BP: (!) 129/58  (!) 100/47 93/63  ?Pulse: 84  64 (!) 51  ?Resp: _0 ?Temp: 98.9 ?F (37.2 ?C)  98.1 ?F (36.7 ?C) 98.9 ?F (37.2 ?C)  ?TempSrc: Oral  Oral   ?SpO2: 96% 96% 97% 95%  ?Weight:      ?Height:      ? ?General exam: Conversant, in no acute distress ?Respiratory system: normal chest rise, clear, no audible wheezing ?Cardiovascular system: regular rhythm, s1-s2 ?Gastrointestinal system: Nondistended, nontender, pos BS ?Central nervous system: No seizures, no tremors ?Extremities: No cyanosis, no joint deformities ?Skin: No rashes, no pallor ?Psychiatry: Affect normal // no auditory hallucinations  ? ?Data Reviewed: ? ?CTA neg for pe, BLE dopplers neg for DVT ? ?Family Communication: Pt in room, family not at  bedside ? ?Disposition: ?Status is: Observation ?The patient will require care spanning > 2 midnights and should be moved to inpatient because: Severity of illness ? Planned Discharge Destination:  Unclear at this  time, pending PT eval ? ? ? ?Author: ?Marylu Lund, MD ?06/08/2021 3:44 PM ? ?For on call review [email or web address]  ?

## 2021-06-08 NOTE — Assessment & Plan Note (Addendum)
-  did appear somewhat dehydrated on exam ?-normalized with IVF ? ?

## 2021-06-09 DIAGNOSIS — R109 Unspecified abdominal pain: Secondary | ICD-10-CM | POA: Diagnosis not present

## 2021-06-09 DIAGNOSIS — E876 Hypokalemia: Secondary | ICD-10-CM

## 2021-06-09 DIAGNOSIS — J189 Pneumonia, unspecified organism: Secondary | ICD-10-CM | POA: Diagnosis not present

## 2021-06-09 LAB — CBC
HCT: 29.6 % — ABNORMAL LOW (ref 36.0–46.0)
Hemoglobin: 10 g/dL — ABNORMAL LOW (ref 12.0–15.0)
MCH: 30.3 pg (ref 26.0–34.0)
MCHC: 33.8 g/dL (ref 30.0–36.0)
MCV: 89.7 fL (ref 80.0–100.0)
Platelets: 114 10*3/uL — ABNORMAL LOW (ref 150–400)
RBC: 3.3 MIL/uL — ABNORMAL LOW (ref 3.87–5.11)
RDW: 13.4 % (ref 11.5–15.5)
WBC: 10.1 10*3/uL (ref 4.0–10.5)
nRBC: 0 % (ref 0.0–0.2)

## 2021-06-09 LAB — COMPREHENSIVE METABOLIC PANEL
ALT: 11 U/L (ref 0–44)
AST: 16 U/L (ref 15–41)
Albumin: 2.9 g/dL — ABNORMAL LOW (ref 3.5–5.0)
Alkaline Phosphatase: 36 U/L — ABNORMAL LOW (ref 38–126)
Anion gap: 7 (ref 5–15)
BUN: 11 mg/dL (ref 8–23)
CO2: 23 mmol/L (ref 22–32)
Calcium: 8.1 mg/dL — ABNORMAL LOW (ref 8.9–10.3)
Chloride: 106 mmol/L (ref 98–111)
Creatinine, Ser: 0.75 mg/dL (ref 0.44–1.00)
GFR, Estimated: >60 mL/min (ref >60)
Glucose, Bld: 100 mg/dL — ABNORMAL HIGH (ref 70–99)
Potassium: 3.7 mmol/L (ref 3.5–5.1)
Sodium: 136 mmol/L (ref 135–145)
Total Bilirubin: 0.6 mg/dL (ref 0.3–1.2)
Total Protein: 5.7 g/dL — ABNORMAL LOW (ref 6.5–8.1)

## 2021-06-09 LAB — MAGNESIUM: Magnesium: 2.4 mg/dL (ref 1.7–2.4)

## 2021-06-09 MED ORDER — HYDROCOD POLI-CHLORPHE POLI ER 10-8 MG/5ML PO SUER: 5.0000 mL | Freq: Two times a day (BID) | ORAL | Status: DC | PRN

## 2021-06-09 MED ORDER — AZITHROMYCIN 250 MG PO TABS
500.0000 mg | ORAL_TABLET | Freq: Every day | ORAL | Status: DC
  Administered 2021-06-09 – 2021-06-10 (×2): 500 mg via ORAL
  Filled 2021-06-09 (×2): qty 2

## 2021-06-09 NOTE — TOC Initial Note (Signed)
Transition of Care (TOC) - Initial/Assessment Note  ? ? ?Patient Details  ?Name: April Chapman ?MRN: [id number] ?Date of Birth: Sep 25, 1950 ? ?Transition of Care (TOC) CM/SW Contact:    ?Sigfredo Schreier E Loreta Blouch, LCSW ?Phone Number: ?06/09/2021, 1:00 PM ? ?Clinical Narrative:                 ?This CSW spoke with patient regarding PT recs. Patient lives alone in [place], and at baseline drives herself to appointments. PCP is Dr. Nicki Reaper. Pharmacy is [facility] in [place]. ?No DME or HH history. Explained PT recs for [facility], patient says she wants to think about it and CSW will follow up tomorrow on decision for [facility]. ? ?Expected Discharge Plan: [place] ?Barriers to Discharge: Continued Medical Work up ? ? ?Patient Goals and CMS Choice ?Patient states their goals for this hospitalization and ongoing recovery are:: to return home, deciding if she wants HH ?CMS Medicare.gov Compare Post Acute Care list provided to:: Patient ?Choice offered to / list presented to : Patient ? ?Expected Discharge Plan and Services ?Expected Discharge Plan: [place] ?  ?  ?  ?Living arrangements for the past 2 months: [place] ?                ?  ?  ?  ?  ?  ?  ?  ?  ?  ?  ? ?Prior Living Arrangements/Services ?Living arrangements for the past 2 months: [place] ?Lives with:: Self ?Patient language and need for interpreter reviewed:: Yes ?Do you feel safe going back to the place where you live?: Yes      ?Need for Family Participation in Patient Care: Yes (Comment) ?Care giver support system in place?: Yes (comment) ?  ?Criminal Activity/Legal Involvement Pertinent to Current Situation/Hospitalization: No - Comment as needed ? ?Activities of Daily Living ?Home Assistive Devices/Equipment: None ?ADL Screening (condition at time of admission) ?Patient's cognitive ability adequate to safely complete daily activities?: Yes ?Is the patient deaf or have difficulty hearing?: No ?Does the patient have  difficulty seeing, even when wearing glasses/contacts?: No ?Does the patient have difficulty concentrating, remembering, or making decisions?: No ?Patient able to express need for assistance with ADLs?: Yes ?Does the patient have difficulty dressing or bathing?: No ?Independently performs ADLs?: Yes (appropriate for developmental age) ?Does the patient have difficulty walking or climbing stairs?: Yes ?Weakness of Legs: Both ?Weakness of Arms/Hands: Both ? ?Permission Sought/Granted ?  ?  ?   ?   ?   ?   ? ?Emotional Assessment ?  ?  ?  ?Orientation: : Oriented to Self, Oriented to Place, Oriented to  Time, Oriented to Situation ?Alcohol / Substance Use: Not Applicable ?Psych Involvement: No (comment) ? ?Admission diagnosis:  Generalized weakness [R53.1] ?Pneumonia of left lower lobe due to infectious organism [J18.9] ?Community acquired pneumonia of left lower lobe of lung [J18.9] ?Streptococcus pneumoniae pneumonia ([place]) [J13] ?Patient Active Problem List  ? Diagnosis Date Noted  ? Positive D dimer 06/08/2021  ? Hypokalemia 06/08/2021  ? Hypomagnesemia 06/08/2021  ? Hyponatremia 06/08/2021  ? Streptococcus pneumoniae pneumonia ([place]) 06/07/2021  ? Mixed hyperlipidemia 06/07/2021  ? Colon cancer screening 12/05/2020  ? Hand pain, left 10/17/2019  ? H/O bilateral breast implants 10/10/2018  ? Tinnitus 10/10/2018  ? Acute left flank pain 01/26/2017  ? Anemia 01/26/2017  ? Tachycardia 04/21/2016  ? Thyroid nodule 04/15/2016  ? Hypercholesterolemia 04/15/2016  ? Health care maintenance 11/14/2015  ?  Multiple myeloma in remission ([place]) 03/23/2015  ? Toenail fungus 10/24/2013  ? Numbness of toes 10/22/2013  ? Shoulder pain, left 04/27/2013  ? Elbow pain, right 04/27/2013  ? Heel pain 04/27/2013  ? Cat scratch 04/27/2013  ? Leg skin lesion, left 04/27/2013  ? Multiple myeloma ([place]) 01/04/2013  ? ?PCP:  Einar Pheasant, MD ?Pharmacy:   ?[facility] [address] ?[place]  [place] [address] ?Phone: [phone] Fax: [phone] ? ?[facility] [place], [place] AT [facility] [address] ?[place] ?[place] [address] ?Phone: [phone] Fax: [phone] ? ? ? ? ?Social Determinants of Health (SDOH) Interventions ?  ? ?Readmission Risk Interventions ? ?  06/09/2021  ? 12:59 PM  ?Readmission Risk Prevention Plan  ?Transportation Screening Complete  ?PCP or Specialist Appt within 5-7 Days Complete  ?Home Care Screening Complete  ?Medication Review (RN CM) Complete  ? ? ? ?

## 2021-06-09 NOTE — Progress Notes (Signed)
?Progress Note ? ? ?Patient: April Chapman GLO:[id number] DOB: 04-Apr-1950 DOA: 06/07/2021     1 ?DOS: the patient was seen and examined on 06/09/2021 ?  ?Brief hospital course: ?72 year old female with past medical history of multiple myeloma (Dx 2011, S/P stem cell transplant 2012), Remote Hx DVT (2012 post transplant), hyperlipidemia who presented to [facility] emergency department with complaints of fevers, cough and left flank pain brought in by EMS. ?  ?Patient explains that approximately 5 days ago she began to experience a cough.  Cough has been intermittently productive.  In the days that followed patient experienced a fever followed by intermittent chills generalized malaise and weakness.  Patient is also been experiencing poor oral intake and progressive nausea.  Patient also began to develop left flank pain, sharp in quality and severe in intensity, worse with movement and cough and improved with rest. ?  ?Over the neck several days patient's symptoms progressively worsened with worsening weakness, cough and poor oral intake.  Due to progressively worsening symptoms EMS was contacted who promptly came to evaluate the patient and brought her in to [facility] emergency department for evaluation ? ?Work up was notable for L sided PNA. Patient was started on azithromycin and rocephin. Hospitalist consulted for consideration for admission ? ?Assessment and Plan: ?* Streptococcus pneumoniae pneumonia ([place]) ?Patient presenting with cough, generalized malaise, weakness and nausea ?Presenting chest x-ray and CT imaging reviewed ?Continue on empiric azithromycin and rocephin, anticipate total 5 days tx ?Blood cx neg ?Pt did report increased coughing with swallowing, seen by SLP and was cleared ?Strep pneumo antigen pos in urine ?COVID-19 testing negative. ?Pt is on room air, however pt reports feeling "cruddy" and "bad" today, actively coughing making conversing difficult ? ? ?Acute left flank pain ?Patient seemingly  experiencing severe left sided pleuritic pain related to identification of left lower lobe pneumonia ?CTA chest reviewed, neg for PE.  ?Cont analgesia as needed ? ?Mixed hyperlipidemia ?Continuing home regimen of lipid lowering therapy. ? ? ?Multiple myeloma in remission ([place]) ?Longstanding known history of multiple myeloma  ?Diagnosis in 2011 ?Stem cell transplant in 2012 ?After multiple failed maintenance therapies over the years due to side effects, patient is currently simply in surveillance ?Continue outpatient follow-up with Dr. Feliciana Rossetti with [facility] oncology ? ?Hyponatremia ?-did appear somewhat dehydrated on exam ?-normalized with IVF ?-Repeat bmet in AM ? ?Hypomagnesemia ?-Replaced ?-Cont to follow lytes and replace as needed ? ?Hypokalemia ?-Replaced ?-Repeat bmet in AM ? ?Positive D dimer ?-Presented with d-dimer over 5 ?-CTA reviewed, neg for PE ?-Ordered and reviewed BLE dopplers, neg for DVT ? ? ?  ? ?Subjective: Complains of feeling "cruddy" and generally bad. Difficult to converse given on-going productive cough ? ?Physical Exam: ?Vitals:  ? 06/08/21 1620 06/08/21 2027 06/09/21 0600 06/09/21 0749  ?BP: 115/66 (!) 102/59 (!) 116/57 117/63  ?Pulse: (!) 53 64 65 65  ?Resp: _0 ?Temp: 98.6 ?F (37 ?C) 98.1 ?F (36.7 ?C) (!) 97.5 ?F (36.4 ?C) 98.6 ?F (37 ?C)  ?TempSrc:  Oral Oral Oral  ?SpO2: 96% 97% 96% 96%  ?Weight:      ?Height:      ? ?General exam: Conversant, coughing ?Respiratory system: normal chest rise, increased resp effort ?Cardiovascular system: regular rhythm, s1-s2 ?Gastrointestinal system: Nondistended, nontender, pos BS ?Central nervous system: No seizures, no tremors ?Extremities: No cyanosis, no joint deformities ?Skin: No rashes, no pallor ?Psychiatry: Affect normal // no auditory hallucinations  ? ?Data Reviewed: ? ?Sodium 136 ? ?  Family Communication: Pt in room, family not at bedside ? ?Disposition: ?Status is: Inpatient ?Continue inpatient stay because: Severity of illness ?  Planned Discharge Destination: Home with Home Health ? ? ? ?Author: ?Marylu Lund, MD ?06/09/2021 2:46 PM ? ?For on call review [email or web address]  ?

## 2021-06-10 DIAGNOSIS — R109 Unspecified abdominal pain: Secondary | ICD-10-CM | POA: Diagnosis not present

## 2021-06-10 DIAGNOSIS — R531 Weakness: Secondary | ICD-10-CM | POA: Diagnosis not present

## 2021-06-10 LAB — CBC
HCT: 31.9 % — ABNORMAL LOW (ref 36.0–46.0)
Hemoglobin: 10.5 g/dL — ABNORMAL LOW (ref 12.0–15.0)
MCH: 29.7 pg (ref 26.0–34.0)
MCHC: 32.9 g/dL (ref 30.0–36.0)
MCV: 90.4 fL (ref 80.0–100.0)
Platelets: 145 10*3/uL — ABNORMAL LOW (ref 150–400)
RBC: 3.53 MIL/uL — ABNORMAL LOW (ref 3.87–5.11)
RDW: 13.3 % (ref 11.5–15.5)
WBC: 8.1 10*3/uL (ref 4.0–10.5)
nRBC: 0 % (ref 0.0–0.2)

## 2021-06-10 LAB — COMPREHENSIVE METABOLIC PANEL
ALT: 10 U/L (ref 0–44)
AST: 16 U/L (ref 15–41)
Albumin: 3 g/dL — ABNORMAL LOW (ref 3.5–5.0)
Alkaline Phosphatase: 42 U/L (ref 38–126)
Anion gap: 7 (ref 5–15)
BUN: 10 mg/dL (ref 8–23)
CO2: 25 mmol/L (ref 22–32)
Calcium: 8.4 mg/dL — ABNORMAL LOW (ref 8.9–10.3)
Chloride: 105 mmol/L (ref 98–111)
Creatinine, Ser: 0.88 mg/dL (ref 0.44–1.00)
GFR, Estimated: >60 mL/min (ref >60)
Glucose, Bld: 99 mg/dL (ref 70–99)
Potassium: 4.4 mmol/L (ref 3.5–5.1)
Sodium: 137 mmol/L (ref 135–145)
Total Bilirubin: 0.5 mg/dL (ref 0.3–1.2)
Total Protein: 6.5 g/dL (ref 6.5–8.1)

## 2021-06-10 LAB — CULTURE, RESPIRATORY W GRAM STAIN: Culture: NORMAL

## 2021-06-10 MED ORDER — VALACYCLOVIR HCL 500 MG PO TABS
1000.0000 mg | ORAL_TABLET | Freq: Two times a day (BID) | ORAL | Status: DC
  Administered 2021-06-10: 1000 mg via ORAL
  Filled 2021-06-10: qty 2

## 2021-06-10 MED ORDER — AZITHROMYCIN 250 MG PO TABS: ORAL_TABLET | ORAL | 0 refills | Status: DC

## 2021-06-10 MED ORDER — VALACYCLOVIR HCL 1 G PO TABS: 1000.0000 mg | ORAL_TABLET | Freq: Two times a day (BID) | ORAL | 0 refills | Status: AC

## 2021-06-10 MED ORDER — CEFDINIR 300 MG PO CAPS: 300.0000 mg | ORAL_CAPSULE | Freq: Two times a day (BID) | ORAL | 0 refills | Status: AC

## 2021-06-10 NOTE — Discharge Summary (Signed)
?Physician Discharge Summary ?  ?Patient: April Chapman MRN: [id number] DOB: 12-13-1950  ?Admit date:     06/07/2021  ?Discharge date: 06/10/21  ?Discharge Physician: Marylu Lund  ? ?PCP: Einar Pheasant, MD  ? ?Recommendations at discharge:  ? ? Follow up with PCP in 1-2 weeks ? ?Discharge Diagnoses: ?Principal Problem: ?  Streptococcus pneumoniae pneumonia ([place]) ?Active Problems: ?  Acute left flank pain ?  Mixed hyperlipidemia ?  Multiple myeloma in remission ([place]) ?  Positive D dimer ?  Hypokalemia ?  Hypomagnesemia ?  Hyponatremia ? ?Resolved Problems: ?  * No resolved hospital problems. * ? ?Hospital Course: ?71 year old female with past medical history of multiple myeloma (Dx 2011, S/P stem cell transplant 2012), Remote Hx DVT (2012 post transplant), hyperlipidemia who presented to [facility] emergency department with complaints of fevers, cough and left flank pain brought in by EMS. ?  ?Patient explains that approximately 5 days ago she began to experience a cough.  Cough has been intermittently productive.  In the days that followed patient experienced a fever followed by intermittent chills generalized malaise and weakness.  Patient is also been experiencing poor oral intake and progressive nausea.  Patient also began to develop left flank pain, sharp in quality and severe in intensity, worse with movement and cough and improved with rest. ?  ?Over the neck several days patient's symptoms progressively worsened with worsening weakness, cough and poor oral intake.  Due to progressively worsening symptoms EMS was contacted who promptly came to evaluate the patient and brought her in to [facility] emergency department for evaluation ? ?Work up was notable for L sided PNA. Patient was started on azithromycin and rocephin. Hospitalist consulted for consideration for admission ? ?Assessment and Plan: ?* Streptococcus pneumoniae pneumonia ([place]) ?Patient presenting with cough, generalized malaise, weakness and  nausea ?Presenting chest x-ray and CT imaging reviewed ?While in hospital, was conitnued on empiric azithromycin and rocephin. Pt to complete course of azithromycin and omnicef ?Blood cx neg ?Pt did report increased coughing with swallowing, seen by SLP and was cleared ?Strep pneumo antigen pos in urine ?COVID-19 testing negative. ? ? ?Acute left flank pain ?Patient seemingly experiencing severe left sided pleuritic pain related to identification of left lower lobe pneumonia ?CTA chest reviewed, neg for PE.  ?Cont analgesia as needed ? ?Mixed hyperlipidemia ?Continuing home regimen of lipid lowering therapy. ? ? ?Multiple myeloma in remission ([place]) ?Longstanding known history of multiple myeloma  ?Diagnosis in 2011 ?Stem cell transplant in 2012 ?After multiple failed maintenance therapies over the years due to side effects, patient is currently simply in surveillance ?Continue outpatient follow-up with Dr. Feliciana Rossetti with [facility] oncology ? ?Hyponatremia ?-did appear somewhat dehydrated on exam ?-normalized with IVF ? ? ?Hypomagnesemia ?-Replaced ? ? ?Hypokalemia ?-Replaced ? ?Positive D dimer ?-Presented with d-dimer over 5 ?-CTA reviewed, neg for PE ?-Ordered and reviewed BLE dopplers, neg for DVT ? ? ? ? ?  ? ? ?Consultants:  ?Procedures performed:   ?Disposition: Home health ?Diet recommendation:  ?Regular diet ?DISCHARGE MEDICATION: ?Allergies as of 06/10/2021   ? ?   Reactions  ? Revlimid [lenalidomide]   ? Pomalidomide Rash  ? ?  ? ?  ?Medication List  ?  ? ?TAKE these medications   ? ?azithromycin 250 MG tablet ?Commonly known as: ZITHROMAX ?1 tab po daily x 3 more days ?Start taking on: June 11, 2021 ?  ?cefdinir 300 MG capsule ?Commonly known as: OMNICEF ?Take 1 capsule (300 mg total) by mouth 2 (  two) times daily for 3 days. ?Start taking on: June 11, 2021 ?  ?pravastatin 10 MG tablet ?Commonly known as: PRAVACHOL ?TAKE 1 TABLET BY MOUTH 3 TIMES A WEEK ON MONDAY, WEDNESDAY, FRIDAY ?What changed: See the new  instructions. ?  ?valACYclovir 1000 MG tablet ?Commonly known as: VALTREX ?Take 1 tablet (1,000 mg total) by mouth 2 (two) times daily for 4 days. ?Start taking on: June 11, 2021 ?What changed:  ?medication strength ?how much to take ?when to take this ?reasons to take this ?  ? ?  ? ? Follow-up Information   ? ? Einar Pheasant, MD Follow up in 2 week(s).   ?Specialty: Internal Medicine ?Why: Hospital follow up as scheduled ?Contact information: ?[address] ?[address] ?[place] [address] ?[phone] ? ? ?  ?  ? ?  ?  ? ?  ? ?Discharge Exam: ?Danley Danker Weights  ? 06/07/21 1736  ?Weight: 75.8 kg  ? ?General exam: Awake, laying in bed, in nad ?Respiratory system: Normal respiratory effort, no wheezing ?Cardiovascular system: regular rate, s1, s2 ?Gastrointestinal system: Soft, nondistended, positive BS ?Central nervous system: CN2-12 grossly intact, strength intact ?Extremities: Perfused, no clubbing ?Skin: Normal skin turgor, no notable skin lesions seen ?Psychiatry: Mood normal // no visual hallucinations  ? ? ?Condition at discharge: good ? ?The results of significant diagnostics from this hospitalization (including imaging, microbiology, ancillary and laboratory) are listed below for reference.  ? ?Imaging Studies: ?DG Chest 2 View ? ?Result Date: 06/07/2021 ?CLINICAL DATA:  Cough and fever for several days EXAM: CHEST - 2 VIEW COMPARISON:  04/24/2021 FINDINGS: Cardiac shadow is within normal limits. Increased left retrocardiac density is noted consistent with acute infiltrate. This projects in the left lower lobe on the lateral projection. Bilateral breast implants are seen. No acute bony abnormality is noted. IMPRESSION: New left lower lobe infiltrate consistent with acute pneumonia. Electronically Signed   By: Inez Catalina M.D.   On: 06/07/2021 19:48  ? ?CT Angio Chest Pulmonary Embolism (PE) W or WO Contrast ? ?Result Date: 06/08/2021 ?CLINICAL DATA:  Suspected pulmonary embolism, high  probability. EXAM: CT ANGIOGRAPHY CHEST WITH CONTRAST TECHNIQUE: Multidetector CT imaging of the chest was performed using the standard protocol during bolus administration of intravenous contrast. Multiplanar CT image reconstructions and MIPs were obtained to evaluate the vascular anatomy. RADIATION DOSE REDUCTION: This exam was performed according to the departmental dose-optimization program which includes automated exposure control, adjustment of the mA and/or kV according to patient size and/or use of iterative reconstruction technique. CONTRAST:  66m OMNIPAQUE IOHEXOL 350 MG/ML SOLN COMPARISON:  PA Lat chest yesterday, CT abdomen and pelvis yesterday, CT abdomen and pelvis with contrast 04/01/2014 FINDINGS: Cardiovascular: The heart is slightly enlarged. There is a minimal pericardial effusion chronically. There is no visible coronary artery calcification. There is minimal aortic atherosclerosis with ectasia in aortic root and ascending segment which both measure 3.8 cm. There is no aneurysm or dissection. The pulmonary veins are decompressed. The pulmonary arteries are within normal caliber limits without evidence of thromboemboli. Mediastinum/Nodes: There are slightly enlarged left hilar lymph nodes up to 1.1 cm in short axis. There are similar mildly prominent subcarinal lymph nodes. There is no axillary or further intrathoracic adenopathy. There is a 1.2 cm heterogeneous nodule of the right lobe of the thyroid gland. No imaging follow-up is recommended. There is no esophageal thickening no tracheal filling defect. Lungs/Pleura: There is dense patchy consolidation in the left lower lobe primarily in the posterior and medial basal  segments but also involving portions of the superior segment and anterior basal segment. Fluid and debris partially fill the left lower lobe main bronchus with small bronchial impactions in the consolidated areas. There is patchy, ill-defined nonlocalized ground-glass opacity in  the right upper lobe anterior and posterior segments in keeping with additional pneumonitis. There are trace pleural effusions. Posterior atelectasis in the right lower lobe is seen. Right middle and left upper lobes are clear. U

## 2021-06-10 NOTE — TOC Transition Note (Signed)
Transition of Care (TOC) - CM/SW Discharge Note ? ? ?Patient Details  ?Name: April Chapman ?MRN: [id number] ?Date of Birth: June 17, 1950 ? ?Transition of Care (TOC) CM/SW Contact:  ?Latanga Nedrow E Tymothy Cass, LCSW ?Phone Number: ?06/10/2021, 12:15 PM ? ? ?Clinical Narrative:    ?Checked with patient who is now agreeable to [facility] services. No agency preferences. Referral made to [facility] with [facility]  ? ? ?Final next level of care: [place] ?Barriers to Discharge: Barriers Resolved ? ? ?Patient Goals and CMS Choice ?Patient states their goals for this hospitalization and ongoing recovery are:: home with [facility] ?CMS Medicare.gov Compare Post Acute Care list provided to:: Patient ?Choice offered to / list presented to : Patient ? ?Discharge Placement ?  ?           ?  ?  ?  ?Patient and family notified of of transfer: 06/10/21 ? ?Discharge Plan and Services ?  ?  ?           ?  ?  ?  ?  ?  ?HH Arranged: PT ?[place] Agency: [place] ([place]) ?Date HH Agency Contacted: 06/10/21 ?  ?Representative spoke with at [place]: Corene Cornea ? ?Social Determinants of Health (SDOH) Interventions ?  ? ? ?Readmission Risk Interventions ? ?  06/09/2021  ? 12:59 PM  ?Readmission Risk Prevention Plan  ?Transportation Screening Complete  ?PCP or Specialist Appt within 5-7 Days Complete  ?Home Care Screening Complete  ?Medication Review (RN CM) Complete  ? ? ? ? ? ?

## 2021-06-11 ENCOUNTER — Telehealth: Payer: Self-pay | Admitting: Internal Medicine

## 2021-06-11 LAB — LEGIONELLA PNEUMOPHILA SEROGP 1 UR AG: L. pneumophila Serogp 1 Ur Ag: NEGATIVE

## 2021-06-11 NOTE — Telephone Encounter (Signed)
Lake Bells from [facility] called in stating that Pt was referred for Physical Therapy... Lake Bells stated that he called pt and Pt decline all Physical Therapy... Lake Bells stated if there any questions or concern please feel free to contact him at [phone] ?

## 2021-06-12 ENCOUNTER — Telehealth: Payer: Self-pay

## 2021-06-12 LAB — CULTURE, BLOOD (ROUTINE X 2)
Culture: NO GROWTH
Culture: NO GROWTH

## 2021-06-12 NOTE — Telephone Encounter (Signed)
Transition Care Management Follow-up Telephone Call ?Date of discharge and from where: 06/10/21 [facility] ?How have you been since you were released from the hospital? Productive cough with yellow/green phlegm. Not worsening since discharge. Bronchodilator in use. Horrible night sweats. Intermittent heaviness in the back between shoulder blades upon waking. Denies fever, pain, sore throat. Staying hydrated. Increasing activity as tolerated. Patient notes antibiotic stops in the next couple of days. Agrees to reach out to PCP if symptoms worsening.  ?Any questions or concerns? No ? ?Items Reviewed: ?Did the pt receive and understand the discharge instructions provided? Yes  ?Medications obtained and verified? Yes  ?Any new allergies since your discharge? No  ?Dietary orders reviewed? Yes, heart healthy ?Do you have support at home? Yes  ? ?Home Care and Equipment/Supplies: ?Were home health services ordered? no ? ?Functional Questionnaire: (I = Independent and D = Dependent) ?ADLs: I ? ?Bathing/Dressing- I ? ?Meal Prep- I ? ?Eating- I ? ?Maintaining continence- I ? ?Transferring/Ambulation- I ? ?Managing Meds- I ? ?Follow up appointments reviewed: ? ?PCP Hospital f/u appt confirmed? Yes  Scheduled to see PCP on 06/21/21 @ 2:00. ?[facility] f/u appt confirmed? N/A ?Are transportation arrangements needed? No  ?If their condition worsens, is the pt aware to call PCP or go to the Emergency Dept.? Yes ?Was the patient provided with contact information for the PCP's office or ED? Yes ?Was to pt encouraged to call back with questions or concerns? Yes  ?

## 2021-06-13 ENCOUNTER — Inpatient Hospital Stay: Payer: Medicare Other | Admitting: Internal Medicine

## 2021-06-21 ENCOUNTER — Ambulatory Visit (INDEPENDENT_AMBULATORY_CARE_PROVIDER_SITE_OTHER): Payer: Medicare Other | Admitting: Internal Medicine

## 2021-06-21 DIAGNOSIS — C9001 Multiple myeloma in remission: Secondary | ICD-10-CM

## 2021-06-21 DIAGNOSIS — F439 Reaction to severe stress, unspecified: Secondary | ICD-10-CM | POA: Diagnosis not present

## 2021-06-21 DIAGNOSIS — E871 Hypo-osmolality and hyponatremia: Secondary | ICD-10-CM

## 2021-06-21 DIAGNOSIS — J154 Pneumonia due to other streptococci: Secondary | ICD-10-CM | POA: Diagnosis not present

## 2021-06-21 DIAGNOSIS — E7800 Pure hypercholesterolemia, unspecified: Secondary | ICD-10-CM

## 2021-06-21 DIAGNOSIS — R7989 Other specified abnormal findings of blood chemistry: Secondary | ICD-10-CM

## 2021-06-21 DIAGNOSIS — D649 Anemia, unspecified: Secondary | ICD-10-CM

## 2021-06-21 MED ORDER — SERTRALINE HCL 25 MG PO TABS: 25.0000 mg | ORAL_TABLET | Freq: Every day | ORAL | 1 refills | Status: DC

## 2021-06-21 NOTE — Progress Notes (Signed)
Patient ID: April Chapman, female   DOB: 1950/09/14, 71 y.o.   MRN: [id number] ? ? ?Subjective:  ? ? Patient ID: April Chapman, female    DOB: Mar 03, 1951, 71 y.o.   MRN: [id number] ? ?This visit occurred during the SARS-CoV-2 public health emergency.  Safety protocols were in place, including screening questions prior to the visit, additional usage of staff PPE, and extensive cleaning of exam room while observing appropriate contact time as indicated for disinfecting solutions.  ? ?Patient here for hospital follow up.  ? ?Chief Complaint  ?Patient presents with  ? Hospitalization Follow-up  ? .  ? ?HPI ?Admitted 06/07/21 - 06/10/21 - with cough, fever and left flank pain.  Diagnosed with strep pneumonia.  Started on azithromycin and rocephin. Blood cultures negative.  Speech therapy evaluated - cleared.  Covid test negative.  CTA negative for PE.  Was hydrated.  Sodium imporved. Symptoms improved and she was discharged to continue asithromycin and omnicef.  States she is feeling better.  Still some congestion and cough, but overall much improved.  Still with fatigue.  Has declined home health PT.  No chest pain.  No increased sob reported.  No abdominal pain or bowel change reported.  Increased stress with above.   ? ? ?Past Medical History:  ?Diagnosis Date  ? Chronic kidney disease H/O  ? stones  ? History of chicken pox   ? Multiple myeloma ([place])   ? s/p stem cell transplant (08/2010)  ? ?Past Surgical History:  ?Procedure Laterality Date  ? APPENDECTOMY  03/2015  ? APPENDECTOMY    ? ?Family History  ?Problem Relation Age of Onset  ? Mental illness Mother   ? Hypercholesterolemia Sister   ? Colon cancer Neg Hx   ? Breast cancer Neg Hx   ? ?Social History  ? ?Socioeconomic History  ? Marital status: Widowed  ?  Spouse name: Not on file  ? Number of children: 2  ? Years of education: Not on file  ? Highest education level: Not on file  ?Occupational History  ? Occupation: retired  ?  Employer: [employer]  ?Tobacco  Use  ? Smoking status: Never  ? Smokeless tobacco: Never  ?Vaping Use  ? Vaping Use: Never used  ?Substance and Sexual Activity  ? Alcohol use: No  ? Drug use: No  ? Sexual activity: Never  ?Other Topics Concern  ? Not on file  ?Social History Narrative  ? Not on file  ? ?Social Determinants of Health  ? ?Financial Resource Strain: Low Risk   ? Difficulty of Paying Living Expenses: Not hard at all  ?Food Insecurity: No Food Insecurity  ? Worried About Charity fundraiser in the Last Year: Never true  ? Ran Out of Food in the Last Year: Never true  ?Transportation Needs: No Transportation Needs  ? Lack of Transportation (Medical): No  ? Lack of Transportation (Non-Medical): No  ?Physical Activity: Insufficiently Active  ? Days of Exercise per Week: 4 days  ? Minutes of Exercise per Session: 20 min  ?Stress: No Stress Concern Present  ? Feeling of Stress : Not at all  ?Social Connections: Unknown  ? Frequency of Communication with Friends and Family: More than three times a week  ? Frequency of Social Gatherings with Friends and Family: More than three times a week  ? Attends Religious Services: Not on file  ? Active Member of Clubs or Organizations: Yes  ? Attends Archivist Meetings: Not  on file  ? Marital Status: Widowed  ? ? ? ?Review of Systems  ?Constitutional:  Positive for fatigue. Negative for fever.  ?HENT:  Positive for congestion. Negative for sore throat.   ?Respiratory:  Positive for cough. Negative for chest tightness and shortness of breath.   ?Cardiovascular:  Negative for chest pain, palpitations and leg swelling.  ?Gastrointestinal:  Negative for abdominal pain, diarrhea, nausea and vomiting.  ?Genitourinary:  Negative for difficulty urinating and dysuria.  ?Musculoskeletal:  Negative for joint swelling and myalgias.  ?Skin:  Negative for color change and rash.  ?Neurological:  Negative for dizziness, light-headedness and headaches.  ?Psychiatric/Behavioral:  Negative for agitation and  dysphoric mood.   ? ?   ?Objective:  ?  ? ?BP 120/70   Pulse 72   Ht 5' 7.5" (1.715 m)   Wt 163 lb 6.4 oz (74.1 kg)   SpO2 98%   BMI 25.21 kg/m?  ?Wt Readings from Last 3 Encounters:  ?06/25/21 163 lb 6.4 oz (74.1 kg)  ?06/07/21 167 lb (75.8 kg)  ?04/24/21 166 lb (75.3 kg)  ? ? ?Physical Exam ?Vitals reviewed.  ?Constitutional:   ?   General: She is not in acute distress. ?   Appearance: Normal appearance.  ?HENT:  ?   Head: Normocephalic and atraumatic.  ?   Right Ear: External ear normal.  ?   Left Ear: External ear normal.  ?Eyes:  ?   General: No scleral icterus.    ?   Right eye: No discharge.     ?   Left eye: No discharge.  ?   Conjunctiva/sclera: Conjunctivae normal.  ?Neck:  ?   Thyroid: No thyromegaly.  ?Cardiovascular:  ?   Rate and Rhythm: Normal rate and regular rhythm.  ?Pulmonary:  ?   Effort: No respiratory distress.  ?   Breath sounds: Normal breath sounds. No wheezing.  ?Abdominal:  ?   General: Bowel sounds are normal.  ?   Palpations: Abdomen is soft.  ?   Tenderness: There is no abdominal tenderness.  ?Musculoskeletal:     ?   General: No swelling or tenderness.  ?   Cervical back: Neck supple. No tenderness.  ?Lymphadenopathy:  ?   Cervical: No cervical adenopathy.  ?Skin: ?   Findings: No erythema or rash.  ?Neurological:  ?   Mental Status: She is alert.  ?Psychiatric:     ?   Mood and Affect: Mood normal.     ?   Behavior: Behavior normal.  ? ? ? ?Outpatient Encounter Medications as of 06/21/2021  ?Medication Sig  ? sertraline (ZOLOFT) 25 MG tablet Take 1 tablet (25 mg total) by mouth daily.  ? azithromycin (ZITHROMAX) 250 MG tablet 1 tab po daily x 3 more days  ? pravastatin (PRAVACHOL) 10 MG tablet TAKE 1 TABLET BY MOUTH 3 TIMES A WEEK ON MONDAY, WEDNESDAY, FRIDAY (Patient taking differently: Take 10 mg by mouth every Monday, Wednesday, and Friday.)  ? ?No facility-administered encounter medications on file as of 06/21/2021.  ?  ? ?Lab Results  ?Component Value Date  ? WBC 8.1 06/10/2021   ? HGB 10.5 (L) 06/10/2021  ? HCT 31.9 (L) 06/10/2021  ? PLT 145 (L) 06/10/2021  ? GLUCOSE 99 06/10/2021  ? CHOL 218 (H) 11/29/2020  ? TRIG 72.0 11/29/2020  ? HDL 52.10 11/29/2020  ? LDLCALC 151 (H) 11/29/2020  ? ALT 10 06/10/2021  ? AST 16 06/10/2021  ? NA 137 06/10/2021  ? K 4.4 06/10/2021  ?  CL 105 06/10/2021  ? CREATININE 0.88 06/10/2021  ? BUN 10 06/10/2021  ? CO2 25 06/10/2021  ? TSH 2.627 04/24/2021  ? INR 1.0 04/01/2014  ? ? ?DG Chest 2 View ? ?Result Date: 06/07/2021 ?CLINICAL DATA:  Cough and fever for several days EXAM: CHEST - 2 VIEW COMPARISON:  04/24/2021 FINDINGS: Cardiac shadow is within normal limits. Increased left retrocardiac density is noted consistent with acute infiltrate. This projects in the left lower lobe on the lateral projection. Bilateral breast implants are seen. No acute bony abnormality is noted. IMPRESSION: New left lower lobe infiltrate consistent with acute pneumonia. Electronically Signed   By: Inez Catalina M.D.   On: 06/07/2021 19:48  ? ?CT Angio Chest Pulmonary Embolism (PE) W or WO Contrast ? ?Result Date: 06/08/2021 ?CLINICAL DATA:  Suspected pulmonary embolism, high probability. EXAM: CT ANGIOGRAPHY CHEST WITH CONTRAST TECHNIQUE: Multidetector CT imaging of the chest was performed using the standard protocol during bolus administration of intravenous contrast. Multiplanar CT image reconstructions and MIPs were obtained to evaluate the vascular anatomy. RADIATION DOSE REDUCTION: This exam was performed according to the departmental dose-optimization program which includes automated exposure control, adjustment of the mA and/or kV according to patient size and/or use of iterative reconstruction technique. CONTRAST:  34m OMNIPAQUE IOHEXOL 350 MG/ML SOLN COMPARISON:  PA Lat chest yesterday, CT abdomen and pelvis yesterday, CT abdomen and pelvis with contrast 04/01/2014 FINDINGS: Cardiovascular: The heart is slightly enlarged. There is a minimal pericardial effusion chronically.  There is no visible coronary artery calcification. There is minimal aortic atherosclerosis with ectasia in aortic root and ascending segment which both measure 3.8 cm. There is no aneurysm or dissection. The p

## 2021-06-21 NOTE — Patient Instructions (Signed)
Saline nasal spray - flush nose at least 2x/day ? ?Nasacort nasal spray - 2 sprays each nostril one time per day.  Do this in the evening.   ? ?Start zoloft - one tablet per day.  ?

## 2021-06-25 ENCOUNTER — Encounter: Payer: Self-pay | Admitting: Internal Medicine

## 2021-06-25 DIAGNOSIS — F439 Reaction to severe stress, unspecified: Secondary | ICD-10-CM | POA: Insufficient documentation

## 2021-06-25 NOTE — Assessment & Plan Note (Addendum)
Intolerance to crestor.  Tolerating pravastatin three times per week.  Low cholesterol diet and exercise.   Follow lipid panel and liver function tests.  ?

## 2021-06-25 NOTE — Assessment & Plan Note (Signed)
Increased stress related to recent illness and hospitalization.  Also with recurring infections.  Discussed.  She does feel needs something to help level things out.  Start zoloft '25mg'$  q day.  Get her back in soon to reassess.  Follow.  ?

## 2021-06-25 NOTE — Assessment & Plan Note (Signed)
S/p second transplant.  Followed by oncology.  Has been stable.   

## 2021-06-25 NOTE — Assessment & Plan Note (Addendum)
Recent admission.  Symptoms have improved.  Still some persistent congestion and cough, but overall better.  No increased sob.  Discharged to complete omnicef and azithromycin. Some persistent nasal congestion - discussed saline nasal spray and nasacort.  Follow.  Discussed will need f/u cxr to confirm clearance.  With weakness.  Has declined PT.   ?

## 2021-06-25 NOTE — Assessment & Plan Note (Signed)
Presented with elevated d dimer.  W/up in hospital reviewed.  CTA negative for PE.  Doppler - negative for clot.  Treating infection.  Follow.  ?

## 2021-06-25 NOTE — Assessment & Plan Note (Signed)
Reviewed labs in hospital.  hgb 10.5.  Follow.   ?

## 2021-06-25 NOTE — Assessment & Plan Note (Signed)
Resolved with IV hydration 

## 2021-07-17 DIAGNOSIS — L570 Actinic keratosis: Secondary | ICD-10-CM | POA: Diagnosis not present

## 2021-07-17 DIAGNOSIS — Z859 Personal history of malignant neoplasm, unspecified: Secondary | ICD-10-CM | POA: Diagnosis not present

## 2021-07-17 DIAGNOSIS — L578 Other skin changes due to chronic exposure to nonionizing radiation: Secondary | ICD-10-CM | POA: Diagnosis not present

## 2021-07-17 DIAGNOSIS — Z872 Personal history of diseases of the skin and subcutaneous tissue: Secondary | ICD-10-CM | POA: Diagnosis not present

## 2021-07-17 DIAGNOSIS — D225 Melanocytic nevi of trunk: Secondary | ICD-10-CM | POA: Diagnosis not present

## 2021-07-17 DIAGNOSIS — L821 Other seborrheic keratosis: Secondary | ICD-10-CM | POA: Diagnosis not present

## 2021-07-18 DIAGNOSIS — M7521 Bicipital tendinitis, right shoulder: Secondary | ICD-10-CM | POA: Diagnosis not present

## 2021-07-20 ENCOUNTER — Ambulatory Visit: Payer: Medicare Other | Admitting: Internal Medicine

## 2021-07-30 ENCOUNTER — Ambulatory Visit: Payer: Medicare Other | Admitting: Internal Medicine

## 2021-08-02 DIAGNOSIS — M8580 Other specified disorders of bone density and structure, unspecified site: Secondary | ICD-10-CM | POA: Diagnosis not present

## 2021-08-02 DIAGNOSIS — Z9484 Stem cells transplant status: Secondary | ICD-10-CM | POA: Diagnosis not present

## 2021-08-02 DIAGNOSIS — C9001 Multiple myeloma in remission: Secondary | ICD-10-CM | POA: Diagnosis not present

## 2021-08-14 DIAGNOSIS — L570 Actinic keratosis: Secondary | ICD-10-CM | POA: Diagnosis not present

## 2021-08-16 ENCOUNTER — Ambulatory Visit (INDEPENDENT_AMBULATORY_CARE_PROVIDER_SITE_OTHER): Payer: Medicare Other | Admitting: Internal Medicine

## 2021-08-16 ENCOUNTER — Ambulatory Visit (INDEPENDENT_AMBULATORY_CARE_PROVIDER_SITE_OTHER): Payer: Medicare Other

## 2021-08-16 ENCOUNTER — Encounter: Payer: Self-pay | Admitting: Internal Medicine

## 2021-08-16 VITALS — BP 122/76 | HR 72 | Temp 98.2°F | Resp 13 | Ht 67.5 in | Wt 164.8 lb

## 2021-08-16 DIAGNOSIS — C9001 Multiple myeloma in remission: Secondary | ICD-10-CM

## 2021-08-16 DIAGNOSIS — F439 Reaction to severe stress, unspecified: Secondary | ICD-10-CM | POA: Diagnosis not present

## 2021-08-16 DIAGNOSIS — M79601 Pain in right arm: Secondary | ICD-10-CM

## 2021-08-16 DIAGNOSIS — Z1211 Encounter for screening for malignant neoplasm of colon: Secondary | ICD-10-CM | POA: Diagnosis not present

## 2021-08-16 DIAGNOSIS — E7800 Pure hypercholesterolemia, unspecified: Secondary | ICD-10-CM | POA: Diagnosis not present

## 2021-08-16 DIAGNOSIS — E041 Nontoxic single thyroid nodule: Secondary | ICD-10-CM | POA: Diagnosis not present

## 2021-08-16 DIAGNOSIS — J189 Pneumonia, unspecified organism: Secondary | ICD-10-CM | POA: Diagnosis not present

## 2021-08-16 DIAGNOSIS — J154 Pneumonia due to other streptococci: Secondary | ICD-10-CM

## 2021-08-16 DIAGNOSIS — D649 Anemia, unspecified: Secondary | ICD-10-CM

## 2021-08-16 MED ORDER — TIZANIDINE HCL 4 MG PO TABS: 4.0000 mg | ORAL_TABLET | Freq: Every evening | ORAL | 0 refills | Status: DC | PRN

## 2021-08-16 MED ORDER — METHYLPREDNISOLONE 4 MG PO TBPK: ORAL_TABLET | ORAL | 0 refills | Status: DC

## 2021-08-16 NOTE — Progress Notes (Unsigned)
Patient ID: April Chapman, female   DOB: [birth year], 71 y.o.   MRN: [id number]   Subjective:    Patient ID: April Chapman, female    DOB: 1950-09-24, 71 y.o.   MRN: [id number]  This visit occurred during the SARS-CoV-2 public health emergency.  Safety protocols were in place, including screening questions prior to the visit, additional usage of staff PPE, and extensive cleaning of exam room while observing appropriate contact time as indicated for disinfecting solutions.   Patient here for  Chief Complaint  Patient presents with   Follow-up    Follow up   .   HPI    Past Medical History:  Diagnosis Date   Chronic kidney disease H/O   stones   History of chicken pox    Multiple myeloma ([place])    s/p stem cell transplant (08/2010)   Past Surgical History:  Procedure Laterality Date   APPENDECTOMY  03/2015   APPENDECTOMY     Family History  Problem Relation Age of Onset   Mental illness Mother    Hypercholesterolemia Sister    Colon cancer Neg Hx    Breast cancer Neg Hx    Social History   Socioeconomic History   Marital status: Widowed    Spouse name: Not on file   Number of children: 2   Years of education: Not on file   Highest education level: Not on file  Occupational History   Occupation: retired    Fish farm manager: [employer]  Tobacco Use   Smoking status: Never   Smokeless tobacco: Never  Vaping Use   Vaping Use: Never used  Substance and Sexual Activity   Alcohol use: No   Drug use: No   Sexual activity: Never  Other Topics Concern   Not on file  Social History Narrative   Not on file   Social Determinants of Health   Financial Resource Strain: Low Risk    Difficulty of Paying Living Expenses: Not hard at all  Food Insecurity: No Food Insecurity   Worried About Charity fundraiser in the Last Year: Never true   [place] in the Last Year: Never true  Transportation Needs: No Transportation Needs   Lack of Transportation (Medical): No    Lack of Transportation (Non-Medical): No  Physical Activity: Insufficiently Active   Days of Exercise per Week: 4 days   Minutes of Exercise per Session: 20 min  Stress: No Stress Concern Present   Feeling of Stress : Not at all  Social Connections: Unknown   Frequency of Communication with Friends and Family: More than three times a week   Frequency of Social Gatherings with Friends and Family: More than three times a week   Attends Religious Services: Not on file   Active Member of Clubs or Organizations: Yes   Attends Archivist Meetings: Not on file   Marital Status: Widowed     Review of Systems     Objective:     BP 122/76 (BP Location: Left Arm, Patient Position: Sitting, Cuff Size: Large)   Pulse 72   Temp 98.2 F (36.8 C) (Temporal)   Resp 13   Ht 5' 7.5" (1.715 m)   Wt 164 lb 12.8 oz (74.8 kg)   SpO2 98%   BMI 25.43 kg/m  Wt Readings from Last 3 Encounters:  08/16/21 164 lb 12.8 oz (74.8 kg)  06/25/21 163 lb 6.4 oz (74.1 kg)  06/07/21 167 lb (75.8 kg)  Physical Exam   Outpatient Encounter Medications as of 08/16/2021  Medication Sig   azithromycin (ZITHROMAX) 250 MG tablet 1 tab po daily x 3 more days   pravastatin (PRAVACHOL) 10 MG tablet TAKE 1 TABLET BY MOUTH 3 TIMES A WEEK ON MONDAY, WEDNESDAY, FRIDAY (Patient taking differently: Take 10 mg by mouth every Monday, Wednesday, and Friday.)   sertraline (ZOLOFT) 25 MG tablet Take 1 tablet (25 mg total) by mouth daily. (Patient not taking: Reported on 08/16/2021)   No facility-administered encounter medications on file as of 08/16/2021.     Lab Results  Component Value Date   WBC 8.1 06/10/2021   HGB 10.5 (L) 06/10/2021   HCT 31.9 (L) 06/10/2021   PLT 145 (L) 06/10/2021   GLUCOSE 99 06/10/2021   CHOL 218 (H) 11/29/2020   TRIG 72.0 11/29/2020   HDL 52.10 11/29/2020   LDLCALC 151 (H) 11/29/2020   ALT 10 06/10/2021   AST 16 06/10/2021   NA 137 06/10/2021   K 4.4 06/10/2021   CL 105  06/10/2021   CREATININE 0.88 06/10/2021   BUN 10 06/10/2021   CO2 25 06/10/2021   TSH 2.627 04/24/2021   INR 1.0 04/01/2014    DG Chest 2 View  Result Date: 06/07/2021 CLINICAL DATA:  Cough and fever for several days EXAM: CHEST - 2 VIEW COMPARISON:  04/24/2021 FINDINGS: Cardiac shadow is within normal limits. Increased left retrocardiac density is noted consistent with acute infiltrate. This projects in the left lower lobe on the lateral projection. Bilateral breast implants are seen. No acute bony abnormality is noted. IMPRESSION: New left lower lobe infiltrate consistent with acute pneumonia. Electronically Signed   By: Inez Catalina M.D.   On: 06/07/2021 19:48   CT Angio Chest Pulmonary Embolism (PE) W or WO Contrast  Result Date: 06/08/2021 CLINICAL DATA:  Suspected pulmonary embolism, high probability. EXAM: CT ANGIOGRAPHY CHEST WITH CONTRAST TECHNIQUE: Multidetector CT imaging of the chest was performed using the standard protocol during bolus administration of intravenous contrast. Multiplanar CT image reconstructions and MIPs were obtained to evaluate the vascular anatomy. RADIATION DOSE REDUCTION: This exam was performed according to the departmental dose-optimization program which includes automated exposure control, adjustment of the mA and/or kV according to patient size and/or use of iterative reconstruction technique. CONTRAST:  69mL OMNIPAQUE IOHEXOL 350 MG/ML SOLN COMPARISON:  PA Lat chest yesterday, CT abdomen and pelvis yesterday, CT abdomen and pelvis with contrast 04/01/2014 FINDINGS: Cardiovascular: The heart is slightly enlarged. There is a minimal pericardial effusion chronically. There is no visible coronary artery calcification. There is minimal aortic atherosclerosis with ectasia in aortic root and ascending segment which both measure 3.8 cm. There is no aneurysm or dissection. The pulmonary veins are decompressed. The pulmonary arteries are within normal caliber limits  without evidence of thromboemboli. Mediastinum/Nodes: There are slightly enlarged left hilar lymph nodes up to 1.1 cm in short axis. There are similar mildly prominent subcarinal lymph nodes. There is no axillary or further intrathoracic adenopathy. There is a 1.2 cm heterogeneous nodule of the right lobe of the thyroid gland. No imaging follow-up is recommended. There is no esophageal thickening no tracheal filling defect. Lungs/Pleura: There is dense patchy consolidation in the left lower lobe primarily in the posterior and medial basal segments but also involving portions of the superior segment and anterior basal segment. Fluid and debris partially fill the left lower lobe main bronchus with small bronchial impactions in the consolidated areas. There is patchy, ill-defined nonlocalized ground-glass opacity in  the right upper lobe anterior and posterior segments in keeping with additional pneumonitis. There are trace pleural effusions. Posterior atelectasis in the right lower lobe is seen. Right middle and left upper lobes are clear. Upper Abdomen: No acute abnormality. There is mild hepatic steatosis. Musculoskeletal: There are bilateral breast implants with partially rim calcified capsules. Both implants are believed to be ruptured and collapsed within the fluid-filled capsules. There is mild osteopenia and degenerative change of the thoracic spine. Review of the MIP images confirms the above findings. IMPRESSION: 1. No evidence of arterial dilatation or embolism. 2. Minimal aortic atherosclerosis with 3.8 cm ectasia in the aortic root and ascending segments. 3. Opacities in the right upper and left lower lobes consistent with multilobar pneumonia, most dense and most confluent in the left lower lobe where there is possibility of an aspiration component with fluid in the main lower lobe bronchus and small bronchial impactions in the most densely consolidated portions. 4. Mildly prominent mediastinal and left  hilar nodes, possibly reactive lymph nodes. Follow-up study is recommended after treatment. 5. 1.2 cm heterogeneous right lobe of thyroid nodule. No imaging follow-up recommended. 6. Mild cardiomegaly with chronic minimal pericardial effusion. 7. Old breast implants with partially rim calcified outer capsules and both appear to be ruptured and collapsed within the capsules. Electronically Signed   By: Telford Nab M.D.   On: 06/08/2021 05:12   CT Abdomen Pelvis W Contrast  Result Date: 06/07/2021 CLINICAL DATA:  Left upper quadrant pain and fever EXAM: CT ABDOMEN AND PELVIS WITH CONTRAST TECHNIQUE: Multidetector CT imaging of the abdomen and pelvis was performed using the standard protocol following bolus administration of intravenous contrast. RADIATION DOSE REDUCTION: This exam was performed according to the departmental dose-optimization program which includes automated exposure control, adjustment of the mA and/or kV according to patient size and/or use of iterative reconstruction technique. CONTRAST:  54m OMNIPAQUE IOHEXOL 300 MG/ML  SOLN COMPARISON:  04/01/2014 FINDINGS: Lower chest: Lung bases demonstrate focal left lower lobe infiltrate similar to that seen on recent chest x-ray. Partially calcified left breast implant is noted as well. Hepatobiliary: No focal liver abnormality is seen. No gallstones, gallbladder wall thickening, or biliary dilatation. Pancreas: Unremarkable. No pancreatic ductal dilatation or surrounding inflammatory changes. Spleen: Normal in size without focal abnormality. Adrenals/Urinary Tract: Adrenal glands are within normal limits. Kidneys demonstrate a normal enhancement pattern bilaterally. Small cysts are seen on the left. Normal excretion is noted on delayed images. No renal calculi or obstructive changes are seen. The bladder is partially distended Stomach/Bowel: The appendix has been surgically removed. No obstructive or inflammatory changes of the colon are seen. Small  bowel and stomach are within normal limits. Vascular/Lymphatic: Aortic atherosclerosis. No enlarged abdominal or pelvic lymph nodes. Reproductive: Uterus and bilateral adnexa are unremarkable. Other: No abdominal wall hernia or abnormality. No abdominopelvic ascites. Musculoskeletal: Degenerative changes of lumbar spine are noted. IMPRESSION: Acute left lower lobe infiltrate similar to that seen on recent chest x-ray. No acute abdominal abnormality is seen to correspond with the given clinical history. Electronically Signed   By: MInez CatalinaM.D.   On: 06/07/2021 19:54   [place]Venous Img Lower Bilateral (DVT)  Result Date: 06/08/2021 CLINICAL DATA:  POSITIVE D-dimer EXAM: BILATERAL LOWER EXTREMITY VENOUS DOPPLER ULTRASOUND TECHNIQUE: Gray-scale sonography with graded compression, as well as color Doppler and duplex ultrasound were performed to evaluate the lower extremity deep venous systems from the level of the common femoral vein and including the common femoral, femoral, profunda femoral,  popliteal and calf veins including the posterior tibial, peroneal and gastrocnemius veins when visible. The superficial great saphenous vein was also interrogated. Spectral Doppler was utilized to evaluate flow at rest and with distal augmentation maneuvers in the common femoral, femoral and popliteal veins. COMPARISON:  CTA chest and CT AP, 06/07/2021. FINDINGS: RIGHT LOWER EXTREMITY VENOUS Normal compressibility of the RIGHT common femoral, superficial femoral, and popliteal veins, as well as the visualized calf veins. Visualized portions of profunda femoral vein and great saphenous vein unremarkable. No filling defects to suggest DVT on grayscale or color Doppler imaging. Doppler waveforms show normal direction of venous flow, normal respiratory plasticity and response to augmentation. OTHER No evidence of superficial thrombophlebitis or abnormal fluid collection. Limitations: none LEFT LOWER EXTREMITY VENOUS Normal  compressibility of the LEFT common femoral, superficial femoral, and popliteal veins, as well as the visualized calf veins. Visualized portions of profunda femoral vein and great saphenous vein unremarkable. No filling defects to suggest DVT on grayscale or color Doppler imaging. Doppler waveforms show normal direction of venous flow, normal respiratory plasticity and response to augmentation. OTHER No evidence of superficial thrombophlebitis or abnormal fluid collection. Limitations: none IMPRESSION: No evidence of femoropopliteal DVT within either lower extremity. Michaelle Birks, MD [facility] Electronically Signed   By: Michaelle Birks M.D.   On: 06/08/2021 10:50       Assessment & Plan:   Problem List Items Addressed This Visit   None    Einar Pheasant, MD

## 2021-08-18 ENCOUNTER — Encounter: Payer: Self-pay | Admitting: Internal Medicine

## 2021-08-18 DIAGNOSIS — M79601 Pain in right arm: Secondary | ICD-10-CM | POA: Insufficient documentation

## 2021-08-18 NOTE — Assessment & Plan Note (Signed)
Intolerance to crestor.  Tolerating pravastatin three times per week.  Low cholesterol diet and exercise.   Follow lipid panel and liver function tests.

## 2021-08-18 NOTE — Assessment & Plan Note (Addendum)
Recently admitted with pneumonia.  Treatd.  Symptoms improved.  Check cxr to confirm clear.

## 2021-08-18 NOTE — Assessment & Plan Note (Signed)
Follow cbc.  

## 2021-08-18 NOTE — Assessment & Plan Note (Signed)
cologuard 12/20/20 - negative.  

## 2021-08-18 NOTE — Assessment & Plan Note (Signed)
S/p second transplant.  Followed by oncology.  Has been stable.   

## 2021-08-18 NOTE — Assessment & Plan Note (Signed)
Right arm/shoulder and neck pain.  Had xray - outside facility - ok.  Discussed possible etiologies.  Treat with medrol dosepack 6 day taper.  Has taken steroids and tolerated.  Follow.  Call with update.

## 2021-08-18 NOTE — Assessment & Plan Note (Signed)
Overall she feels she is doing ok.  Did not start zoloft.  Follow.

## 2021-08-18 NOTE — Assessment & Plan Note (Signed)
Saw Dr Solum 03/2021 - reviewed Dr Solum's note: " She has a 2.1 cm right-sided nodule, stable and no need for a biopsy at this time. No need for routine thyroid imaging given its long-term stability on serial ultrasounds.  She was reassured that she is euthyroid.  Follow up with Endocrinology as needed"   

## 2021-08-27 DIAGNOSIS — M25511 Pain in right shoulder: Secondary | ICD-10-CM | POA: Diagnosis not present

## 2021-08-29 DIAGNOSIS — M25511 Pain in right shoulder: Secondary | ICD-10-CM | POA: Diagnosis not present

## 2021-09-02 ENCOUNTER — Other Ambulatory Visit: Payer: Self-pay | Admitting: Internal Medicine

## 2021-09-07 ENCOUNTER — Ambulatory Visit: Payer: Self-pay

## 2021-09-14 ENCOUNTER — Encounter: Payer: Self-pay | Admitting: Emergency Medicine

## 2021-09-14 ENCOUNTER — Ambulatory Visit
Admission: EM | Admit: 2021-09-14 | Discharge: 2021-09-14 | Disposition: A | Discharge status: Home / Self Care | Payer: Medicare Other

## 2021-09-14 DIAGNOSIS — M79601 Pain in right arm: Secondary | ICD-10-CM | POA: Diagnosis not present

## 2021-09-14 DIAGNOSIS — M25511 Pain in right shoulder: Secondary | ICD-10-CM | POA: Diagnosis not present

## 2021-09-14 NOTE — Discharge Instructions (Addendum)
As we discussed, I believe your pain in your arm is either coming from impingement of a nerve in your shoulder or your neck.  This will require an MRI to determine and that is not something we can provide here at our urgent care.  Please follow back up with [facility] as they have the ability to conduct appropriate imaging to determine the source of your pain.

## 2021-09-14 NOTE — ED Provider Notes (Signed)
[facility]    CSN: [id number] Arrival date & time: 09/14/21  8502      History   Chief Complaint Chief Complaint  Patient presents with   Muscle Pain    Right upper arm    HPI April Chapman is a 71 y.o. female.   HPI  71 year old female here for evaluation of right arm pain.  Patient reports that she has been experiencing pain in her right bicep for the past month.  She was previously evaluated at [facility] and had x-rays that did not show any bony lesions.  She does have a history of multiple myeloma.  She was prescribed meloxicam 15 mg daily and physical therapy.  She states she has been doing the physical therapy and meloxicam without any improvement of her symptoms.  She states that now she has a headache that will not go away and is causing her to be nauseous.  She does not have any changes in vision, or numbness or tingling in her right arm.  She does endorse some decreased grip strength in her right hand but states that that has been going on for over a month as well.  Past Medical History:  Diagnosis Date   Chronic kidney disease H/O   stones   History of chicken pox    Multiple myeloma ([place])    s/p stem cell transplant (08/2010)    Patient Active Problem List   Diagnosis Date Noted   Right arm pain 08/18/2021   Stress 06/25/2021   Positive D dimer 06/08/2021   Hypokalemia 06/08/2021   Hypomagnesemia 06/08/2021   Hyponatremia 06/08/2021   Pneumonia 06/07/2021   Mixed hyperlipidemia 06/07/2021   Colon cancer screening 12/05/2020   Hand pain, left 10/17/2019   H/O bilateral breast implants 10/10/2018   Tinnitus 10/10/2018   Acute left flank pain 01/26/2017   Anemia 01/26/2017   Tachycardia 04/21/2016   Thyroid nodule 04/15/2016   Hypercholesterolemia 04/15/2016   Health care maintenance 11/14/2015   Multiple myeloma in remission ([place]) 03/23/2015   Toenail fungus 10/24/2013   Numbness of toes 10/22/2013   Shoulder pain, left 04/27/2013    Elbow pain, right 04/27/2013   Heel pain 04/27/2013   Cat scratch 04/27/2013   Leg skin lesion, left 04/27/2013   Multiple myeloma ([place]) 01/04/2013    Past Surgical History:  Procedure Laterality Date   APPENDECTOMY  03/2015   APPENDECTOMY      OB History     Gravida  2   Para  2   Term      Preterm      AB      Living         SAB      IAB      Ectopic      Multiple      Live Births               Home Medications    Prior to Admission medications   Medication Sig Start Date End Date Taking? Authorizing Provider  pravastatin (PRAVACHOL) 10 MG tablet TAKE 1 TABLET BY MOUTH 3 TIMES A WEEK ON MONDAY, [place], [place] Patient taking differently: Take 10 mg by mouth every Monday, Wednesday, and Friday. 05/14/21   Einar Pheasant, MD  tiZANidine (ZANAFLEX) 4 MG tablet TAKE 1 TABLET(4 MG) BY MOUTH AT BEDTIME AS NEEDED FOR MUSCLE SPASMS 09/02/21   Kennyth Arnold, FNP    Family History Family History  Problem Relation Age of Onset  Mental illness Mother    Hypercholesterolemia Sister    Colon cancer Neg Hx    Breast cancer Neg Hx     Social History Social History   Tobacco Use   Smoking status: Never   Smokeless tobacco: Never  Vaping Use   Vaping Use: Never used  Substance Use Topics   Alcohol use: No   Drug use: No     Allergies   Revlimid [lenalidomide] and Pomalidomide   Review of Systems Review of Systems  Constitutional:  Negative for activity change, appetite change and fever.  Musculoskeletal:  Positive for arthralgias and myalgias. Negative for joint swelling.  Skin:  Negative for color change.  Neurological:  Positive for weakness. Negative for numbness.  Hematological: Negative.   Psychiatric/Behavioral: Negative.       Physical Exam Triage Vital Signs ED Triage Vitals  Enc Vitals Group     BP 09/14/21 0837 (!) 158/87     Pulse Rate 09/14/21 0837 66     Resp 09/14/21 0837 14     Temp 09/14/21 0837 98.2 F (36.8 C)      Temp Source 09/14/21 0837 Oral     SpO2 09/14/21 0837 99 %     Weight 09/14/21 0834 165 lb (74.8 kg)     Height 09/14/21 0834 5' 7.5" (1.715 m)     Head Circumference --      Peak Flow --      Pain Score 09/14/21 0834 7     Pain Loc --      Pain Edu? --      Excl. in [place]? --    No data found.  Updated Vital Signs BP (!) 158/87 (BP Location: Left Arm)   Pulse 66   Temp 98.2 F (36.8 C) (Oral)   Resp 14   Ht 5' 7.5" (1.715 m)   Wt 165 lb (74.8 kg)   SpO2 99%   BMI 25.46 kg/m   Visual Acuity Right Eye Distance:   Left Eye Distance:   Bilateral Distance:    Right Eye Near:   Left Eye Near:    Bilateral Near:     Physical Exam Vitals and nursing note reviewed.  Constitutional:      Appearance: Normal appearance. She is not ill-appearing.  HENT:     Head: Normocephalic and atraumatic.  Musculoskeletal:        General: Tenderness present. No swelling, deformity or signs of injury.  Skin:    General: Skin is warm and dry.     Capillary Refill: Capillary refill takes less than 2 seconds.     Findings: No bruising or erythema.  Neurological:     General: No focal deficit present.     Mental Status: She is alert and oriented to person, place, and time.     Sensory: No sensory deficit.     Motor: Weakness present.  Psychiatric:        Mood and Affect: Mood normal.        Behavior: Behavior normal.        Thought Content: Thought content normal.        Judgment: Judgment normal.      UC Treatments / Results  Labs (all labs ordered are listed, but only abnormal results are displayed) Labs Reviewed - No data to display  EKG   Radiology No results found.  Procedures Procedures (including critical care time)  Medications Ordered in UC Medications - No data to display  Initial Impression / Assessment  and Plan / UC Course  I have reviewed the triage vital signs and the nursing notes.  Pertinent labs & imaging results that were available during my care  of the patient were reviewed by me and considered in my medical decision making (see chart for details).  Patient is a very pleasant, nontoxic-appearing 71 year old female here for evaluation of 1 month worth of right arm pain that has not improved with the use of prescription NSAIDs and physical therapy.  She has been evaluated by [facility] and the note from the visit on 07/18/2021 instructed her to call or return if her symptoms do not improve.  She has not followed back up with the provider at [facility].  She is here today because she is distraught over the pain in her arm and her headache.  She wants some answers as to the cause of her pain and a solution.  She has also tried a 7-day course of steroids without any improvement.  On physical exam patient's right arm and shoulder are in normal anatomical alignment.  Her grip strength in her right hand is 4/5.  Her upper extremity strength is 5/5 in resisted flexion and extension of her right arm does induce pain in her right bicep.  There is no muscle tension appreciated in the right bicep but it is tender to palpation.  No visible erythema or ecchymosis noted.  No defect in the muscle noted.  With active and passive range of motion patient can only achieve approximately 45 degrees of abduction of her right shoulder.  Flexion and extension of the right upper arm are also markedly decreased.  With passive range of motion patient's shoulder does appear to be frozen as there is significant resistance when her right upper arm is abducted to 45 degrees.  There is no crepitus appreciated when palpating the glenohumeral joint through range of motion.  She does have significant tension and spasm in her trapezius and the superior aspect as well as the cervical aspect.  I believe this is what is causing her headache as well.  I have offered the patient a skeletal muscle laxer and to repeat a dose of steroids.  I have also suggested that she follow-up with [facility] as  I believe she needs an MRI of her shoulder as well as her neck to see if her pain is coming from a denervation injury.  Patient has elected to go to [facility] and access care in [place] through the urgent care.  She left ambulatory and in stable condition.   Final Clinical Impressions(s) / UC Diagnoses   Final diagnoses:  Arm pain, anterior, right     Discharge Instructions      As we discussed, I believe your pain in your arm is either coming from impingement of a nerve in your shoulder or your neck.  This will require an MRI to determine and that is not something we can provide here at our urgent care.  Please follow back up with [facility] as they have the ability to conduct appropriate imaging to determine the source of your pain.     ED Prescriptions   None    I have reviewed the PDMP during this encounter.   Margarette Canada, NP 09/14/21 [phone]

## 2021-09-14 NOTE — ED Triage Notes (Signed)
Patient c/o right upper muscle pain for over a month.  Patient states that she has been seen at [facility] for this pain.  Patient reports swelling in her lower right forearm for the past 3 days.  Patient denies injury or fall.  Patient reports HA that started yesterday.  Patient states she has been taking Ibuprofen and tylenol with no relief.

## 2021-09-20 DIAGNOSIS — M25511 Pain in right shoulder: Secondary | ICD-10-CM | POA: Diagnosis not present

## 2021-09-27 ENCOUNTER — Ambulatory Visit (INDEPENDENT_AMBULATORY_CARE_PROVIDER_SITE_OTHER): Payer: Medicare Other

## 2021-09-27 VITALS — Ht 67.5 in | Wt 165.0 lb

## 2021-09-27 DIAGNOSIS — Z0000 Encounter for general adult medical examination without abnormal findings: Secondary | ICD-10-CM

## 2021-09-27 NOTE — Patient Instructions (Addendum)
  April Chapman , Thank you for taking time to come for your Medicare Wellness Visit. I appreciate your ongoing commitment to your health goals. Please review the following plan we discussed and let me know if I can assist you in the future.   These are the goals we discussed:  Goals       Patient Stated     Follow up with Primary Care Provider (pt-stated)      As needed.        This is a list of the screening recommended for you and due dates:  Health Maintenance  Topic Date Due   Mammogram  10/16/2021*   Flu Shot  10/16/2021   Cologuard (Stool DNA test)  12/21/2023   Tetanus Vaccine  10/30/2029   Pneumonia Vaccine  Completed   DEXA scan (bone density measurement)  Completed   Hepatitis C Screening: USPSTF Recommendation to screen - Ages 38-79 yo.  Completed   Zoster (Shingles) Vaccine  Completed   HPV Vaccine  Aged Out   COVID-19 Vaccine  Discontinued  *Topic was postponed. The date shown is not the original due date.

## 2021-09-27 NOTE — Progress Notes (Signed)
Subjective:   April Chapman is a 71 y.o. female who presents for Medicare Annual (Subsequent) preventive examination.  Review of Systems    No ROS.  Medicare Wellness Virtual Visit.  Visual/audio telehealth visit, UTA vital signs.   See social history for additional risk factors.   Cardiac Risk Factors include: advanced age (>95mn, >>14women)     Objective:    Today's Vitals   09/27/21 1036  Weight: 165 lb (74.8 kg)  Height: 5' 7.5" (1.715 m)   Body mass index is 25.46 kg/m.     09/27/2021   10:41 AM 09/14/2021    8:36 AM 06/07/2021   10:10 PM 06/07/2021    5:39 PM 04/24/2021    9:54 PM 09/26/2020   10:50 AM 05/23/2020   11:52 AM  Advanced Directives  Does Patient Have a Medical Advance Directive? Yes No Yes Yes No Yes No  Type of [place]  Living will Living will  [place]   Does patient want to make changes to medical advance directive? No - Patient declined  No - Patient declined No - Patient declined  No - Patient declined   Copy of [place] Chart? No - copy requested     No - copy requested   Would patient like information on creating a medical advance directive?   No - Patient declined No - Patient declined       Current Medications (verified) Outpatient Encounter Medications as of 09/27/2021  Medication Sig   pravastatin (PRAVACHOL) 10 MG tablet TAKE 1 TABLET BY MOUTH 3 TIMES A WEEK ON MONDAY, WEDNESDAY, FRIDAY (Patient not taking: Reported on 09/27/2021)   tiZANidine (ZANAFLEX) 4 MG tablet TAKE 1 TABLET(4 MG) BY MOUTH AT BEDTIME AS NEEDED FOR MUSCLE SPASMS (Patient not taking: Reported on 09/27/2021)   No facility-administered encounter medications on file as of 09/27/2021.    Allergies (verified) Revlimid [lenalidomide] and Pomalidomide   History: Past Medical History:  Diagnosis Date   Chronic kidney disease H/O   stones   History of chicken pox     Multiple myeloma ([facility])    s/p stem cell transplant (08/2010)   Past Surgical History:  Procedure Laterality Date   APPENDECTOMY  03/2015   APPENDECTOMY     Family History  Problem Relation Age of Onset   Mental illness Mother    Hypercholesterolemia Sister    Colon cancer Neg Hx    Breast cancer Neg Hx    Social History   Socioeconomic History   Marital status: Widowed    Spouse name: Not on file   Number of children: 2   Years of education: Not on file   Highest education level: Not on file  Occupational History   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[place]  Tobacco Use   Smoking status: Never   Smokeless tobacco: Never  Vaping Use   Vaping Use: Never used  Substance and Sexual Activity   Alcohol use: No   Drug use: No   Sexual activity: Never  Other Topics Concern   Not on file  Social History Narrative   Not on file   Social Determinants of Health   Financial Resource Strain: Low Risk  (09/27/2021)   Overall Financial Resource Strain (CARDIA)    Difficulty of Paying Living Expenses: Not hard at all  Food Insecurity: No Food Insecurity (09/27/2021)   Hunger Vital Sign    Worried About  Running Out of Food in the Last Year: Never true    Ran Out of Food in the Last Year: Never true  Transportation Needs: No Transportation Needs (09/27/2021)   PRAPARE - Transportation    Lack of Transportation (Medical): No    Lack of Transportation (Non-Medical): No  Physical Activity: Sufficiently Active (09/27/2021)   Exercise Vital Sign    Days of Exercise per Week: 5 days    Minutes of Exercise per Session: 30 min  Stress: No Stress Concern Present (09/27/2021)   [employer] - Occupational Stress Questionnaire    Feeling of Stress : Not at all  Social Connections: Unknown (09/27/2021)   Social Connection and Isolation Panel [NHANES]    Frequency of Communication with Friends and Family: More than three times a week    Frequency of Social  Gatherings with Friends and Family: More than three times a week    Attends Religious Services: Not on file    Active Member of Clubs or Organizations: Yes    Attends Club or Organization Meetings: Not on file    Marital Status: Widowed    Tobacco Counseling Counseling given: Not Answered   Clinical Intake:  Pre-visit preparation completed: Yes        Diabetes: No  How often do you need to have someone help you when you read instructions, pamphlets, or other written materials from your doctor or pharmacy?: 1 - Never    Interpreter Needed?: No      Activities of Daily Living    09/27/2021   10:42 AM 06/07/2021   10:10 PM  In your present state of health, do you have any difficulty performing the following activities:  Hearing? 0 0  Vision? 0 0  Difficulty concentrating or making decisions? 0 0  Walking or climbing stairs? 0 1  Dressing or bathing? 0 0  Doing errands, shopping? 0 0  Preparing Food and eating ? N   Using the Toilet? N   In the past six months, have you accidently leaked urine? N   Do you have problems with loss of bowel control? N   Managing your Medications? N   Managing your Finances? N   Housekeeping or managing your Housekeeping? N     Patient Care Team: Scott, Charlene, MD as PCP - General (Internal Medicine)  Indicate any recent Medical Services you may have received from other than [facility] providers in the past year (date may be approximate).     Assessment:   This is a routine wellness examination for April Chapman.  Virtual Visit via Telephone Note  I connected with  April Chapman on 09/27/21 at 10:30 AM EDT by telephone and verified that I am speaking with the correct person using two identifiers.  Persons participating in the virtual visit: patient/Nurse Health Advisor   I discussed the limitations of performing an evaluation and management service by telehealth. We continued and completed visit with audio only. Some vital signs may be  absent or patient reported.   Hearing/Vision screen Hearing Screening - Comments:: Patient is able to hear conversational tones without difficulty. No issues reported.  Vision Screening - Comments:: Followed by Dr. Woodard Cataracts extracted, bilateral They have seen their ophthalmologist in the last 12 months.   Dietary issues and exercise activities discussed: Current Exercise Habits: Home exercise routine, Intensity: Mild Healthy diet    Goals Addressed               This Visit's Progress       Patient Stated     Follow up with Primary Care Provider (pt-stated)        As needed.       Depression Screen    09/27/2021   10:39 AM 09/26/2020   10:47 AM 09/24/2019   10:47 AM 04/12/2019    1:56 PM 09/23/2018   10:42 AM 07/02/2017    2:22 PM 06/04/2016    2:22 PM  PHQ 2/9 Scores  PHQ - 2 Score 0 0 0 0 0 0 0    Fall Risk    09/27/2021   10:41 AM 09/26/2020   10:54 AM 09/24/2019   10:47 AM 04/12/2019    1:55 PM 09/23/2018   10:42 AM  Fall Risk   Falls in the past year? 0 0 0 0 0  Number falls in past yr:  0 0    Injury with Fall?  0     Follow up Falls evaluation completed Falls evaluation completed Falls evaluation completed      FALL RISK PREVENTION PERTAINING TO THE HOME: Home free of loose throw rugs in walkways, pet beds, electrical cords, etc? Yes  Adequate lighting in your home to reduce risk of falls? Yes   ASSISTIVE DEVICES UTILIZED TO PREVENT FALLS: Use of a cane, walker or w/c? No   TIMED UP AND GO: Was the test performed? No .   Cognitive Function: Patient is alert and oriented x3.     09/24/2019   11:13 AM  MMSE - Mini Mental State Exam  Not completed: Unable to complete        09/23/2018   10:43 AM  6CIT Screen  What Year? 0 points  What month? 0 points  What time? 0 points  Count back from 20 0 points  Months in reverse 0 points  Repeat phrase 0 points  Total Score 0 points    Immunizations Immunization History  Administered Date(s)  Administered   DTaP 05/29/2011, 08/15/2011, 12/06/2011, 02/20/2012   DTaP / Hep B / IPV 05/15/2017, 11/06/2017, 10/01/2018   Hepatitis B, adult 05/29/2011, 08/15/2011, 12/06/2011   HiB (PRP-T) 05/15/2017, 11/06/2017, 10/01/2018   IPV 05/29/2011, 08/15/2011, 12/06/2011   Influenza,inj,Quad PF,6+ Mos 03/23/2015   MMR 03/23/2015, 04/08/2019   Pneumococcal Conjugate-13 05/29/2011, 08/15/2011, 12/06/2011, 02/20/2012, 05/15/2017, 11/06/2017, 10/01/2018   Pneumococcal Polysaccharide-23 03/23/2015, 04/08/2019   Tdap 10/31/2019   Zoster Recombinat (Shingrix) 05/15/2017, 11/06/2017   Screening Tests Health Maintenance  Topic Date Due   MAMMOGRAM  10/16/2021 (Originally 04/07/2021)   INFLUENZA VACCINE  10/16/2021   Fecal DNA (Cologuard)  12/21/2023   TETANUS/TDAP  10/30/2029   Pneumonia Vaccine 65+ Years old  Completed   DEXA SCAN  Completed   Hepatitis C Screening  Completed   Zoster Vaccines- Shingrix  Completed   HPV VACCINES  Aged Out   COVID-19 Vaccine  Discontinued   Health Maintenance There are no preventive care reminders to display for this patient.  Mammogram- deferred ordering per patient. Plans to discuss further with PCP.   Lung Cancer Screening: (Low Dose CT Chest recommended if Age 55-80 years, 30 pack-year currently smoking OR have quit w/in 15years.) does not qualify.   Vision Screening: Recommended annual ophthalmology exams for early detection of glaucoma and other disorders of the eye.  Dental Screening: Recommended annual dental exams for proper oral hygiene  Community Resource Referral / Chronic Care Management: CRR required this visit?  No   CCM required this visit?  No      Plan:   Keep all   routine maintenance appointments.   I have personally reviewed and noted the following in the patient's chart:   Medical and social history Use of alcohol, tobacco or illicit drugs  Current medications and supplements including opioid prescriptions.  Functional  ability and status Nutritional status Physical activity Advanced directives List of other physicians Hospitalizations, surgeries, and ER visits in previous 12 months Vitals Screenings to include cognitive, depression, and falls Referrals and appointments  In addition, I have reviewed and discussed with patient certain preventive protocols, quality metrics, and best practice recommendations. A written personalized care plan for preventive services as well as general preventive health recommendations were provided to patient.     OBrien-Blaney, Denisa L, LPN   09/27/2021         

## 2021-10-22 ENCOUNTER — Other Ambulatory Visit: Payer: Medicare Other

## 2021-10-24 ENCOUNTER — Other Ambulatory Visit (INDEPENDENT_AMBULATORY_CARE_PROVIDER_SITE_OTHER): Payer: Medicare Other

## 2021-10-24 DIAGNOSIS — E7800 Pure hypercholesterolemia, unspecified: Secondary | ICD-10-CM

## 2021-10-24 LAB — HEPATIC FUNCTION PANEL
ALT: 11 U/L (ref 0–35)
AST: 17 U/L (ref 0–37)
Albumin: 4.2 g/dL (ref 3.5–5.2)
Alkaline Phosphatase: 57 U/L (ref 39–117)
Bilirubin, Direct: 0.1 mg/dL (ref 0.0–0.3)
Total Bilirubin: 0.5 mg/dL (ref 0.2–1.2)
Total Protein: 6.9 g/dL (ref 6.0–8.3)

## 2021-10-24 LAB — LIPID PANEL
Cholesterol: 217 mg/dL — ABNORMAL HIGH (ref 0–200)
HDL: 61.8 mg/dL (ref >39.00)
LDL Cholesterol: 141 mg/dL — ABNORMAL HIGH (ref 0–99)
NonHDL: 154.92
Total CHOL/HDL Ratio: 4
Triglycerides: 72 mg/dL (ref 0.0–149.0)
VLDL: 14.4 mg/dL (ref 0.0–40.0)

## 2021-10-24 LAB — BASIC METABOLIC PANEL
BUN: 20 mg/dL (ref 6–23)
CO2: 28 mEq/L (ref 19–32)
Calcium: 9.2 mg/dL (ref 8.4–10.5)
Chloride: 104 mEq/L (ref 96–112)
Creatinine, Ser: 0.85 mg/dL (ref 0.40–1.20)
GFR: 69.32 mL/min (ref >60.00)
Glucose, Bld: 94 mg/dL (ref 70–99)
Potassium: 3.7 mEq/L (ref 3.5–5.1)
Sodium: 137 mEq/L (ref 135–145)

## 2021-10-25 ENCOUNTER — Ambulatory Visit (INDEPENDENT_AMBULATORY_CARE_PROVIDER_SITE_OTHER): Payer: Medicare Other | Admitting: Internal Medicine

## 2021-10-25 ENCOUNTER — Encounter: Payer: Self-pay | Admitting: Internal Medicine

## 2021-10-25 VITALS — BP 130/70 | HR 60 | Temp 98.3°F | Resp 15 | Ht 67.5 in | Wt 169.2 lb

## 2021-10-25 DIAGNOSIS — Z0000 Encounter for general adult medical examination without abnormal findings: Secondary | ICD-10-CM | POA: Diagnosis not present

## 2021-10-25 DIAGNOSIS — F439 Reaction to severe stress, unspecified: Secondary | ICD-10-CM | POA: Diagnosis not present

## 2021-10-25 DIAGNOSIS — Z1231 Encounter for screening mammogram for malignant neoplasm of breast: Secondary | ICD-10-CM | POA: Diagnosis not present

## 2021-10-25 DIAGNOSIS — C9001 Multiple myeloma in remission: Secondary | ICD-10-CM | POA: Diagnosis not present

## 2021-10-25 DIAGNOSIS — Z9882 Breast implant status: Secondary | ICD-10-CM | POA: Diagnosis not present

## 2021-10-25 DIAGNOSIS — E7800 Pure hypercholesterolemia, unspecified: Secondary | ICD-10-CM | POA: Diagnosis not present

## 2021-10-25 DIAGNOSIS — M79601 Pain in right arm: Secondary | ICD-10-CM | POA: Diagnosis not present

## 2021-10-25 DIAGNOSIS — Z1211 Encounter for screening for malignant neoplasm of colon: Secondary | ICD-10-CM | POA: Diagnosis not present

## 2021-10-25 DIAGNOSIS — E041 Nontoxic single thyroid nodule: Secondary | ICD-10-CM

## 2021-10-25 MED ORDER — LOVASTATIN 10 MG PO TABS: ORAL_TABLET | ORAL | 1 refills | Status: DC

## 2021-10-25 NOTE — Progress Notes (Signed)
Patient ID: April Chapman, female   DOB: [birth year], 71 y.o.   MRN: [id number]   Subjective:    Patient ID: April Chapman, female    DOB: October 07, 1950, 71 y.o.   MRN: [id number]   Patient here for her physical exam.   Chief Complaint  Patient presents with   Follow-up    Yearly CPE   .   HPI Reports she is doing relatively well.  Has been having issues with her right shoulder/arm.  Has been seen at [facility]. Recommended PT/antiinflammatories.  Persistent problems, especially with abduction at or above 90 degrees.  Requested/discussed referral.  No chest pain.  Tries to stay active.  Breathing stable.  No increased cough or congestion.  No acid reflux reported.  No abdominal pain.  Bowels moving.  Discussed labs.  Has had issues with statin medication.  Discussed lovastatin three days per week.  Agreable.  Discussed mammogram.     Past Medical History:  Diagnosis Date   Chronic kidney disease H/O   stones   History of chicken pox    Multiple myeloma ([place])    s/p stem cell transplant (08/2010)   Past Surgical History:  Procedure Laterality Date   APPENDECTOMY  03/2015   APPENDECTOMY     Family History  Problem Relation Age of Onset   Mental illness Mother    Hypercholesterolemia Sister    Colon cancer Neg Hx    Breast cancer Neg Hx    Social History   Socioeconomic History   Marital status: Widowed    Spouse name: Not on file   Number of children: 2   Years of education: Not on file   Highest education level: Not on file  Occupational History   Occupation: retired    Fish farm manager: [employer]  Tobacco Use   Smoking status: Never   Smokeless tobacco: Never  Vaping Use   Vaping Use: Never used  Substance and Sexual Activity   Alcohol use: No   Drug use: No   Sexual activity: Never  Other Topics Concern   Not on file  Social History Narrative   Not on file   Social Determinants of Health   Financial Resource Strain: Low Risk  (09/27/2021)   Overall Financial  Resource Strain (CARDIA)    Difficulty of Paying Living Expenses: Not hard at all  Food Insecurity: No Food Insecurity (09/27/2021)   Hunger Vital Sign    Worried About Running Out of Food in the Last Year: Never true    [place] in the Last Year: Never true  Transportation Needs: No Transportation Needs (09/27/2021)   PRAPARE - Hydrologist (Medical): No    Lack of Transportation (Non-Medical): No  Physical Activity: Sufficiently Active (09/27/2021)   Exercise Vital Sign    Days of Exercise per Week: 5 days    Minutes of Exercise per Session: 30 min  Stress: No Stress Concern Present (09/27/2021)   [place]    Feeling of Stress : Not at all  Social Connections: Unknown (09/27/2021)   Social Connection and Isolation Panel [NHANES]    Frequency of Communication with Friends and Family: More than three times a week    Frequency of Social Gatherings with Friends and Family: More than three times a week    Attends Religious Services: Not on file    Active Member of Clubs or Organizations: Yes    Attends Club or  Organization Meetings: Not on file    Marital Status: Widowed     Review of Systems  Constitutional:  Negative for appetite change and unexpected weight change.  HENT:  Negative for congestion, sinus pressure and sore throat.   Eyes:  Negative for pain and visual disturbance.  Respiratory:  Negative for cough, chest tightness and shortness of breath.   Cardiovascular:  Negative for chest pain, palpitations and leg swelling.  Gastrointestinal:  Negative for abdominal pain, diarrhea, nausea and vomiting.  Genitourinary:  Negative for difficulty urinating and dysuria.  Musculoskeletal:  Negative for joint swelling and myalgias.       Shoulder pain as outlined.    Skin:  Negative for color change and rash.  Neurological:  Negative for dizziness, light-headedness and headaches.   Hematological:  Negative for adenopathy. Does not bruise/bleed easily.  Psychiatric/Behavioral:  Negative for agitation and dysphoric mood.        Objective:     BP 130/70 (BP Location: Left Arm, Patient Position: Sitting, Cuff Size: Small)   Pulse 60   Temp 98.3 F (36.8 C) (Temporal)   Resp 15   Ht 5' 7.5" (1.715 m)   Wt 169 lb 3.2 oz (76.7 kg)   SpO2 97%   BMI 26.11 kg/m  Wt Readings from Last 3 Encounters:  10/25/21 169 lb 3.2 oz (76.7 kg)  09/27/21 165 lb (74.8 kg)  09/14/21 165 lb (74.8 kg)    Physical Exam Vitals reviewed.  Constitutional:      General: She is not in acute distress.    Appearance: Normal appearance. She is well-developed.  HENT:     Head: Normocephalic and atraumatic.     Right Ear: External ear normal.     Left Ear: External ear normal.  Eyes:     General: No scleral icterus.       Right eye: No discharge.        Left eye: No discharge.     Conjunctiva/sclera: Conjunctivae normal.  Neck:     Thyroid: No thyromegaly.  Cardiovascular:     Rate and Rhythm: Normal rate and regular rhythm.  Pulmonary:     Effort: No tachypnea, accessory muscle usage or respiratory distress.     Breath sounds: Normal breath sounds. No decreased breath sounds or wheezing.  Chest:  Breasts:    Right: No inverted nipple, mass, nipple discharge or tenderness (no axillary adenopathy).     Left: No inverted nipple, mass, nipple discharge or tenderness (no axilarry adenopathy).  Abdominal:     General: Bowel sounds are normal.     Palpations: Abdomen is soft.     Tenderness: There is no abdominal tenderness.  Musculoskeletal:        General: No swelling.     Cervical back: Neck supple.     Comments: Increased pain - right shoulder - especially with abduction at or above 90 degrees.   Lymphadenopathy:     Cervical: No cervical adenopathy.  Skin:    Findings: No erythema or rash.  Neurological:     Mental Status: She is alert and oriented to person, place, and  time.  Psychiatric:        Mood and Affect: Mood normal.        Behavior: Behavior normal.      Outpatient Encounter Medications as of 10/25/2021  Medication Sig   [DISCONTINUED] lovastatin (MEVACOR) 10 MG tablet Take one tablet q Monday, Wednesday and Friday.   [DISCONTINUED] pravastatin (PRAVACHOL) 10 MG tablet TAKE  1 TABLET BY MOUTH 3 TIMES A WEEK ON MONDAY, WEDNESDAY, FRIDAY   lovastatin (MEVACOR) 10 MG tablet Take one tablet q Monday, Wednesday and Friday.   [DISCONTINUED] tiZANidine (ZANAFLEX) 4 MG tablet TAKE 1 TABLET(4 MG) BY MOUTH AT BEDTIME AS NEEDED FOR MUSCLE SPASMS (Patient not taking: Reported on 10/25/2021)   No facility-administered encounter medications on file as of 10/25/2021.     Lab Results  Component Value Date   WBC 8.1 06/10/2021   HGB 10.5 (L) 06/10/2021   HCT 31.9 (L) 06/10/2021   PLT 145 (L) 06/10/2021   GLUCOSE 94 10/24/2021   CHOL 217 (H) 10/24/2021   TRIG 72.0 10/24/2021   HDL 61.80 10/24/2021   LDLCALC 141 (H) 10/24/2021   ALT 11 10/24/2021   AST 17 10/24/2021   NA 137 10/24/2021   K 3.7 10/24/2021   CL 104 10/24/2021   CREATININE 0.85 10/24/2021   BUN 20 10/24/2021   CO2 28 10/24/2021   TSH 2.627 04/24/2021   INR 1.0 04/01/2014       Assessment & Plan:   Problem List Items Addressed This Visit     Colon cancer screening    cologuard 12/20/20 - negative.       H/O bilateral breast implants    Saw Dr Stasia Cavalier.  Mammogram 03/2019 - no suspicious mass.   Has previous declined f/u mammogram.  Discussed today.  Agreeable.        Health care maintenance    Physical today 10/25/21.  cologuard 12/20/20 - negative.  Overdue mammogram.  Agreeable after discussion.  Ordered.       Hypercholesterolemia    Intolerance to crestor and pravastatin.  Discussed labs and calculated cholesterol risk.  Agreeable to trial of lovastatin 45m q Monday, Wednesday and Friday.  Check liver panel in 6 weeks.  Low cholesterol diet and exercise.  Follow lipid  panel and liver function tests.       Relevant Medications   lovastatin (MEVACOR) 10 MG tablet   Other Relevant Orders   Hepatic function panel   Multiple myeloma ([place]    S/p second transplant.  Followed by oncology.  Has been stable.        Right arm pain    Right arm/shoulder pain as outlined.  Has been evaluated at [facility].  Limited rom as outlined. Request referral.        Relevant Orders   Ambulatory referral to Orthopedic Surgery   Stress    Overall appears to be handling things relatively well.  Follow.       Thyroid nodule    Saw Dr SGabriel Carina1/2023 - reviewed Dr SJoycie Peeknote: " She has a 2.1 cm right-sided nodule, stable and no need for a biopsy at this time. No need for routine thyroid imaging given its long-term stability on serial ultrasounds.  She was reassured that she is euthyroid.  Follow up with Endocrinology as needed"        Other Visit Diagnoses     Routine general medical examination at a health care facility    -  Primary   Encounter for screening mammogram for malignant neoplasm of breast       Relevant Orders   MM 3D SCREEN BREAST W/IMPLANT BILATERAL        CEinar Pheasant MD

## 2021-10-25 NOTE — Assessment & Plan Note (Addendum)
Physical today 10/25/21.  cologuard 12/20/20 - negative.  Overdue mammogram.  Agreeable after discussion.  Ordered.

## 2021-11-04 ENCOUNTER — Encounter: Payer: Self-pay | Admitting: Internal Medicine

## 2021-11-04 NOTE — Assessment & Plan Note (Signed)
Saw Dr Stasia Cavalier.  Mammogram 03/2019 - no suspicious mass.   Has previous declined f/u mammogram.  Discussed today.  Agreeable.

## 2021-11-04 NOTE — Assessment & Plan Note (Signed)
Saw Dr Gabriel Carina 03/2021 - reviewed Dr Joycie Peek note: " She has a 2.1 cm right-sided nodule, stable and no need for a biopsy at this time. No need for routine thyroid imaging given its long-term stability on serial ultrasounds.  She was reassured that she is euthyroid.  Follow up with Endocrinology as needed"

## 2021-11-04 NOTE — Assessment & Plan Note (Signed)
cologuard 12/20/20 - negative.  

## 2021-11-04 NOTE — Assessment & Plan Note (Signed)
Intolerance to crestor and pravastatin.  Discussed labs and calculated cholesterol risk.  Agreeable to trial of lovastatin '10mg'$  q Monday, Wednesday and Friday.  Check liver panel in 6 weeks.  Low cholesterol diet and exercise.  Follow lipid panel and liver function tests.

## 2021-11-04 NOTE — Assessment & Plan Note (Signed)
S/p second transplant.  Followed by oncology.  Has been stable.

## 2021-11-04 NOTE — Assessment & Plan Note (Signed)
Right arm/shoulder pain as outlined.  Has been evaluated at [facility].  Limited rom as outlined. Request referral.

## 2021-11-04 NOTE — Assessment & Plan Note (Signed)
Overall appears to be handling things relatively well.  Follow.   

## 2021-11-06 DIAGNOSIS — C9000 Multiple myeloma not having achieved remission: Secondary | ICD-10-CM | POA: Diagnosis not present

## 2021-11-06 DIAGNOSIS — G629 Polyneuropathy, unspecified: Secondary | ICD-10-CM | POA: Diagnosis not present

## 2021-11-06 DIAGNOSIS — G620 Drug-induced polyneuropathy: Secondary | ICD-10-CM | POA: Diagnosis not present

## 2021-11-06 DIAGNOSIS — M79601 Pain in right arm: Secondary | ICD-10-CM | POA: Diagnosis not present

## 2021-11-06 DIAGNOSIS — Z52011 Autologous donor, stem cells: Secondary | ICD-10-CM | POA: Diagnosis not present

## 2021-11-06 DIAGNOSIS — Z6826 Body mass index (BMI) 26.0-26.9, adult: Secondary | ICD-10-CM | POA: Diagnosis not present

## 2021-11-06 DIAGNOSIS — Z9481 Bone marrow transplant status: Secondary | ICD-10-CM | POA: Diagnosis not present

## 2021-11-06 DIAGNOSIS — Z79899 Other long term (current) drug therapy: Secondary | ICD-10-CM | POA: Diagnosis not present

## 2021-11-06 DIAGNOSIS — C9001 Multiple myeloma in remission: Secondary | ICD-10-CM | POA: Diagnosis not present

## 2021-11-06 DIAGNOSIS — T451X5A Adverse effect of antineoplastic and immunosuppressive drugs, initial encounter: Secondary | ICD-10-CM | POA: Diagnosis not present

## 2021-11-11 ENCOUNTER — Other Ambulatory Visit: Payer: Self-pay | Admitting: Internal Medicine

## 2021-11-12 NOTE — Telephone Encounter (Signed)
Pravastatin stopped due to intolerance.  Started on lovastatin.

## 2021-11-22 DIAGNOSIS — R937 Abnormal findings on diagnostic imaging of other parts of musculoskeletal system: Secondary | ICD-10-CM | POA: Diagnosis not present

## 2021-11-22 DIAGNOSIS — C9001 Multiple myeloma in remission: Secondary | ICD-10-CM | POA: Diagnosis not present

## 2021-11-22 DIAGNOSIS — C9000 Multiple myeloma not having achieved remission: Secondary | ICD-10-CM | POA: Diagnosis not present

## 2021-11-26 DIAGNOSIS — M7501 Adhesive capsulitis of right shoulder: Secondary | ICD-10-CM | POA: Diagnosis not present

## 2021-11-30 DIAGNOSIS — L570 Actinic keratosis: Secondary | ICD-10-CM | POA: Diagnosis not present

## 2021-12-03 ENCOUNTER — Telehealth: Payer: Self-pay | Admitting: Internal Medicine

## 2021-12-03 NOTE — Telephone Encounter (Signed)
Pt called stating her physical was coded wrong and medicare is not paying for it. Pt would like to be called

## 2021-12-05 NOTE — Telephone Encounter (Signed)
Patient stated she would not have had a physical is she new her insurance would not pay for it. She also stated she has had this same insurance for years and she has not had any problems in the past.

## 2021-12-06 ENCOUNTER — Other Ambulatory Visit: Payer: Medicare Other

## 2022-01-17 DIAGNOSIS — Z859 Personal history of malignant neoplasm, unspecified: Secondary | ICD-10-CM | POA: Diagnosis not present

## 2022-01-17 DIAGNOSIS — L821 Other seborrheic keratosis: Secondary | ICD-10-CM | POA: Diagnosis not present

## 2022-01-17 DIAGNOSIS — D485 Neoplasm of uncertain behavior of skin: Secondary | ICD-10-CM | POA: Diagnosis not present

## 2022-01-17 DIAGNOSIS — L578 Other skin changes due to chronic exposure to nonionizing radiation: Secondary | ICD-10-CM | POA: Diagnosis not present

## 2022-01-17 DIAGNOSIS — L570 Actinic keratosis: Secondary | ICD-10-CM | POA: Diagnosis not present

## 2022-01-17 DIAGNOSIS — D225 Melanocytic nevi of trunk: Secondary | ICD-10-CM | POA: Diagnosis not present

## 2022-01-29 DIAGNOSIS — C9000 Multiple myeloma not having achieved remission: Secondary | ICD-10-CM | POA: Diagnosis not present

## 2022-01-29 DIAGNOSIS — T451X5A Adverse effect of antineoplastic and immunosuppressive drugs, initial encounter: Secondary | ICD-10-CM | POA: Diagnosis not present

## 2022-01-29 DIAGNOSIS — G629 Polyneuropathy, unspecified: Secondary | ICD-10-CM | POA: Diagnosis not present

## 2022-02-19 DIAGNOSIS — E041 Nontoxic single thyroid nodule: Secondary | ICD-10-CM | POA: Diagnosis not present

## 2022-02-20 ENCOUNTER — Other Ambulatory Visit (INDEPENDENT_AMBULATORY_CARE_PROVIDER_SITE_OTHER): Payer: Medicare Other

## 2022-02-20 DIAGNOSIS — E7800 Pure hypercholesterolemia, unspecified: Secondary | ICD-10-CM | POA: Diagnosis not present

## 2022-02-20 LAB — HEPATIC FUNCTION PANEL
ALT: 12 U/L (ref 0–35)
AST: 17 U/L (ref 0–37)
Albumin: 4.2 g/dL (ref 3.5–5.2)
Alkaline Phosphatase: 59 U/L (ref 39–117)
Bilirubin, Direct: 0.1 mg/dL (ref 0.0–0.3)
Total Bilirubin: 0.6 mg/dL (ref 0.2–1.2)
Total Protein: 6.8 g/dL (ref 6.0–8.3)

## 2022-02-21 ENCOUNTER — Other Ambulatory Visit (INDEPENDENT_AMBULATORY_CARE_PROVIDER_SITE_OTHER): Payer: Medicare Other

## 2022-02-21 ENCOUNTER — Other Ambulatory Visit: Payer: Self-pay | Admitting: Internal Medicine

## 2022-02-21 DIAGNOSIS — E7800 Pure hypercholesterolemia, unspecified: Secondary | ICD-10-CM | POA: Diagnosis not present

## 2022-02-21 LAB — TSH: TSH: 2.11 u[IU]/mL (ref 0.35–5.50)

## 2022-02-21 LAB — LIPID PANEL
Cholesterol: 239 mg/dL — ABNORMAL HIGH (ref 0–200)
HDL: 63 mg/dL (ref >39.00)
LDL Cholesterol: 158 mg/dL — ABNORMAL HIGH (ref 0–99)
NonHDL: 175.76
Total CHOL/HDL Ratio: 4
Triglycerides: 87 mg/dL (ref 0.0–149.0)
VLDL: 17.4 mg/dL (ref 0.0–40.0)

## 2022-02-21 NOTE — Progress Notes (Signed)
Order placed for add on labs.   °

## 2022-02-25 ENCOUNTER — Ambulatory Visit: Payer: Medicare Other | Admitting: Internal Medicine

## 2022-02-28 DIAGNOSIS — L821 Other seborrheic keratosis: Secondary | ICD-10-CM | POA: Diagnosis not present

## 2022-02-28 DIAGNOSIS — L570 Actinic keratosis: Secondary | ICD-10-CM | POA: Diagnosis not present

## 2022-03-02 DIAGNOSIS — R059 Cough, unspecified: Secondary | ICD-10-CM | POA: Diagnosis not present

## 2022-03-02 DIAGNOSIS — Z20822 Contact with and (suspected) exposure to covid-19: Secondary | ICD-10-CM | POA: Diagnosis not present

## 2022-03-02 DIAGNOSIS — R0989 Other specified symptoms and signs involving the circulatory and respiratory systems: Secondary | ICD-10-CM | POA: Diagnosis not present

## 2022-03-31 IMAGING — CR DG CHEST 2V
2 series · 2 of 2 positions shown · non-contrast
Comparison: 04/02/2014

CLINICAL DATA: Cough for 12 days

EXAM:
CHEST - 2 VIEW

[chest pa]
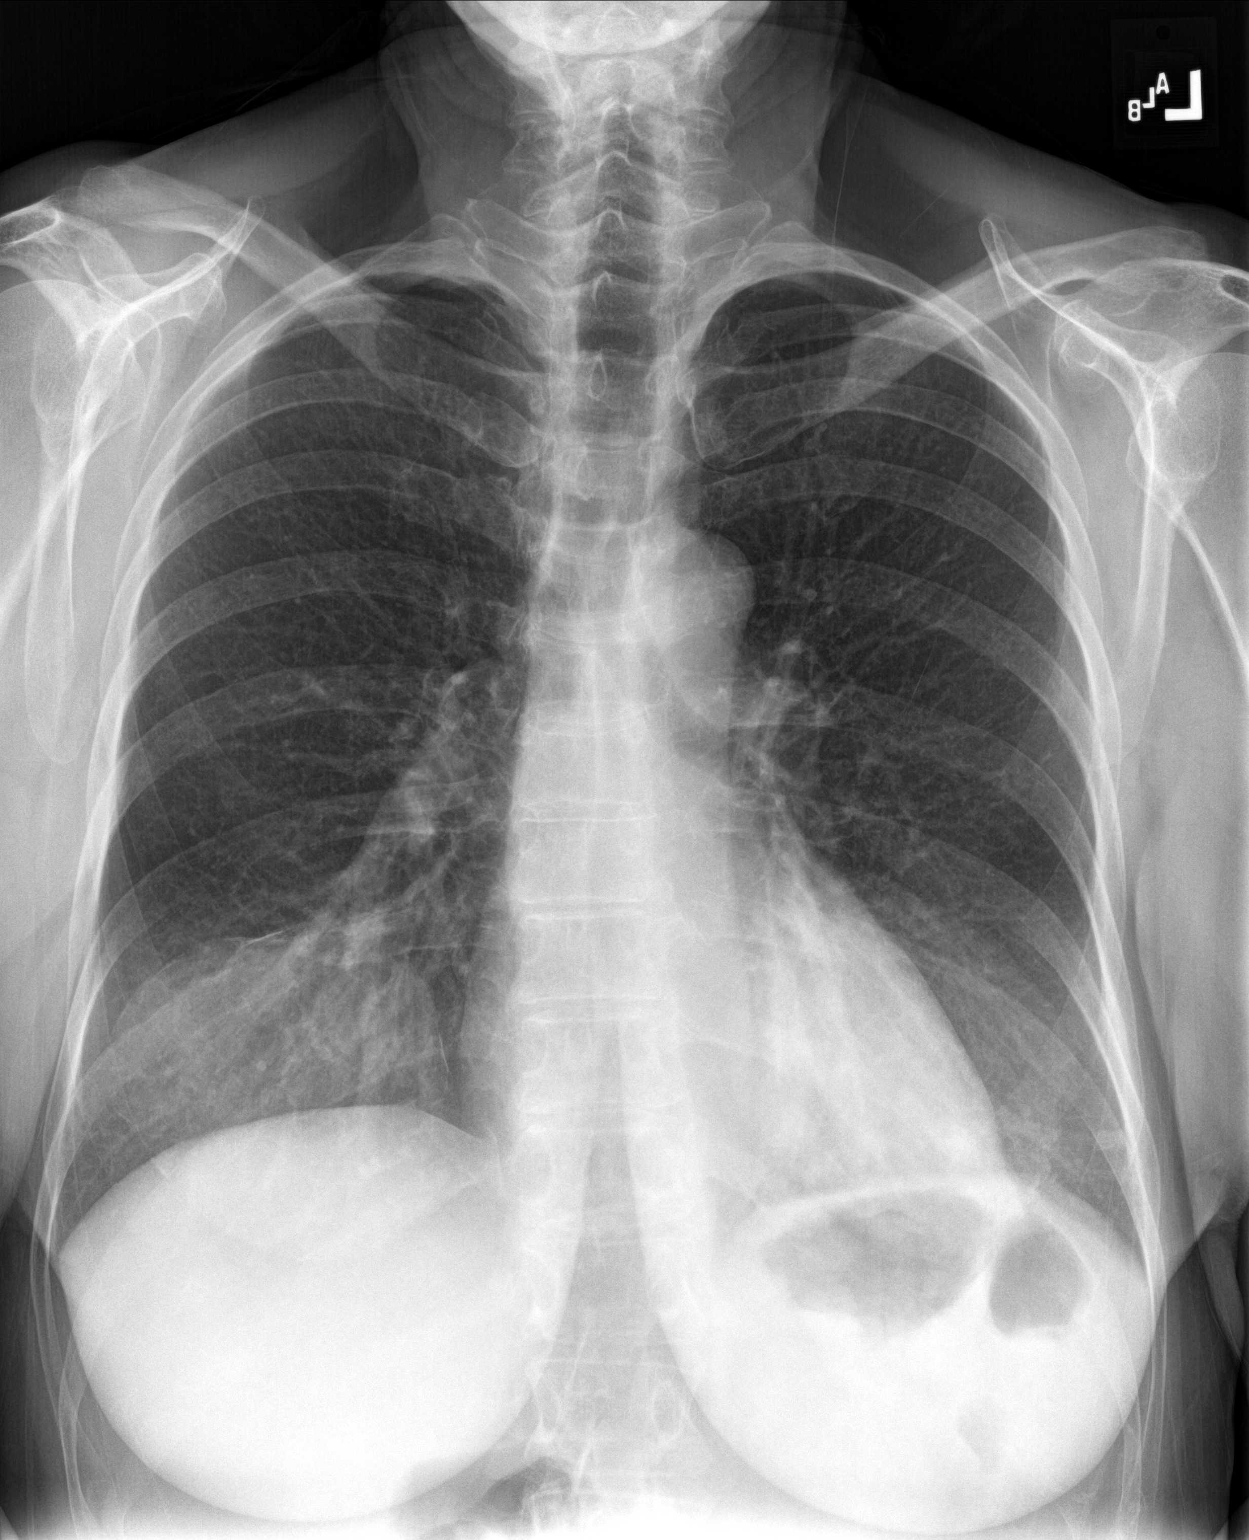

[chest lat]
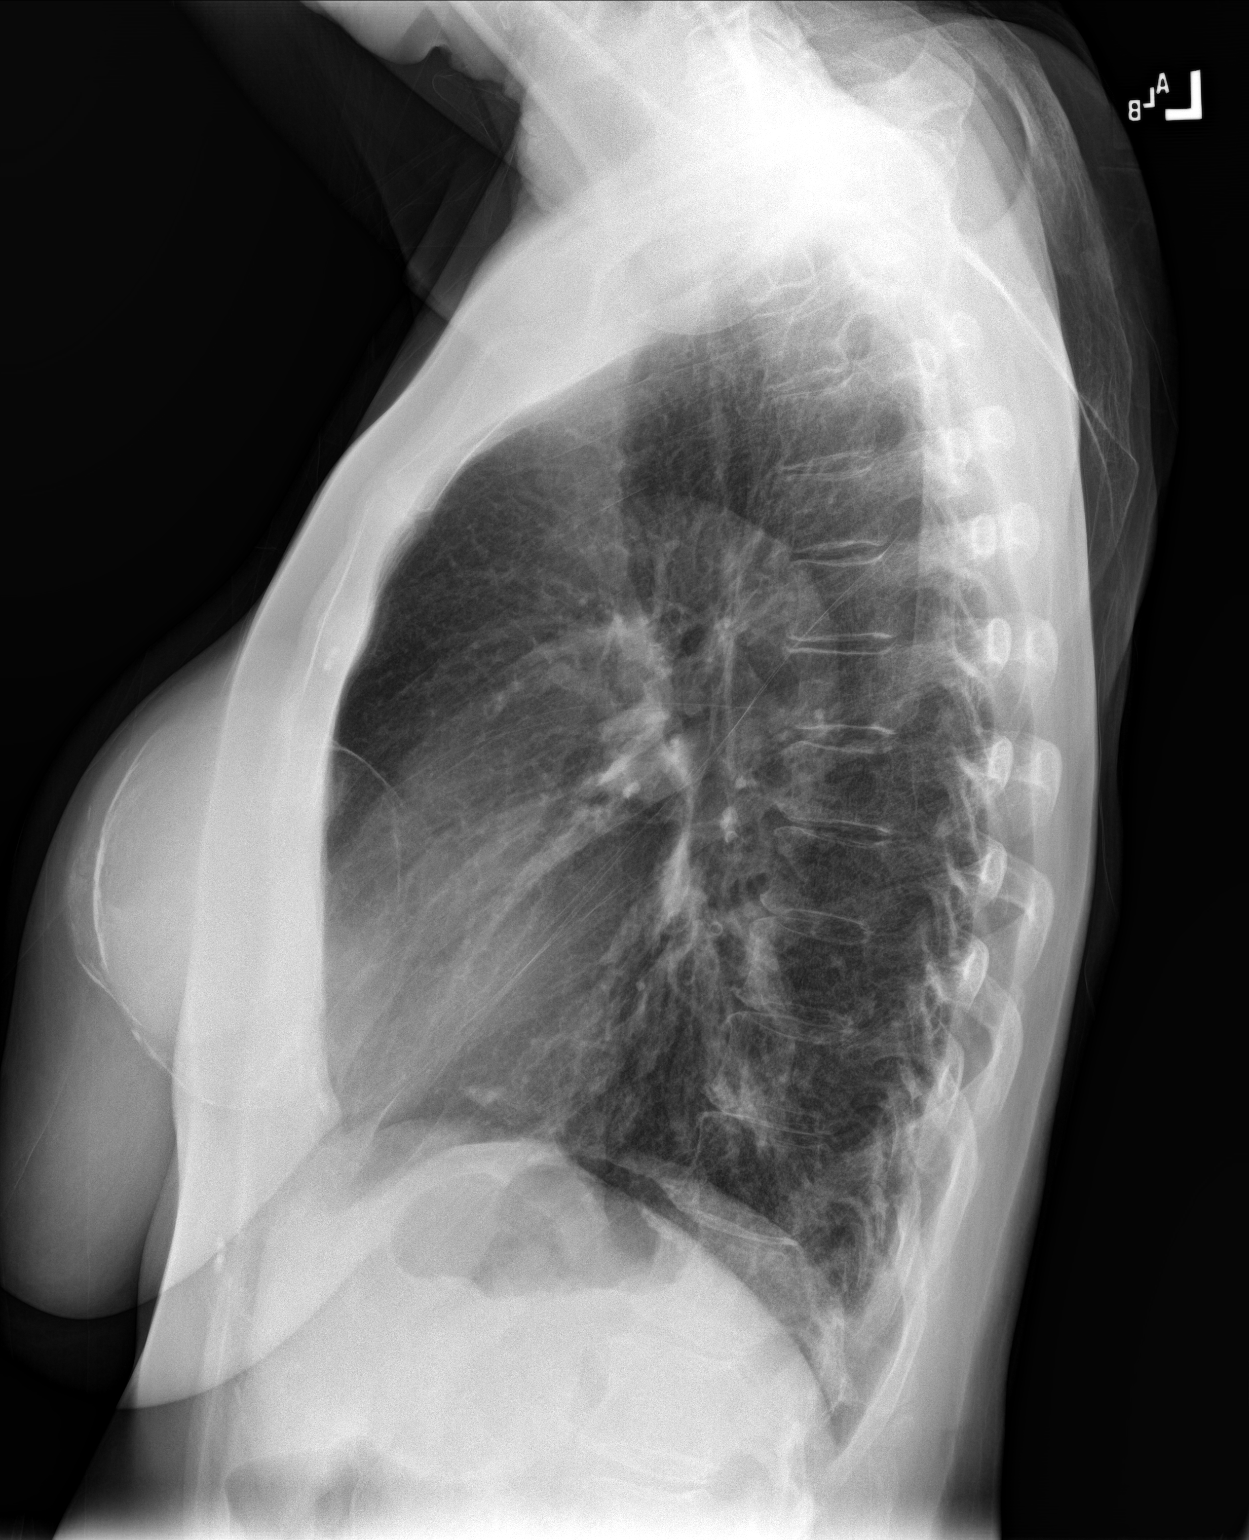

[2 of 2 positions shown; findings below may reference images not displayed]

FINDINGS: Normal cardiac silhouette. Linear opacities posterior to the heart.
No pleural fluid. No pneumothorax. No acute osseous abnormality.
Breast implants noted.
IMPRESSION: LEFT lower lobe atelectasis versus infiltrate

## 2022-04-12 ENCOUNTER — Ambulatory Visit (INDEPENDENT_AMBULATORY_CARE_PROVIDER_SITE_OTHER): Payer: Medicare Other | Admitting: Internal Medicine

## 2022-04-12 ENCOUNTER — Telehealth: Payer: Self-pay | Admitting: Internal Medicine

## 2022-04-12 ENCOUNTER — Encounter: Payer: Self-pay | Admitting: Internal Medicine

## 2022-04-12 VITALS — BP 126/70 | HR 70 | Ht 67.0 in | Wt 177.0 lb

## 2022-04-12 DIAGNOSIS — Z1211 Encounter for screening for malignant neoplasm of colon: Secondary | ICD-10-CM | POA: Diagnosis not present

## 2022-04-12 DIAGNOSIS — D649 Anemia, unspecified: Secondary | ICD-10-CM | POA: Diagnosis not present

## 2022-04-12 DIAGNOSIS — M7989 Other specified soft tissue disorders: Secondary | ICD-10-CM | POA: Diagnosis not present

## 2022-04-12 DIAGNOSIS — F439 Reaction to severe stress, unspecified: Secondary | ICD-10-CM

## 2022-04-12 DIAGNOSIS — M79601 Pain in right arm: Secondary | ICD-10-CM

## 2022-04-12 DIAGNOSIS — E041 Nontoxic single thyroid nodule: Secondary | ICD-10-CM | POA: Diagnosis not present

## 2022-04-12 DIAGNOSIS — E7800 Pure hypercholesterolemia, unspecified: Secondary | ICD-10-CM | POA: Diagnosis not present

## 2022-04-12 DIAGNOSIS — C9001 Multiple myeloma in remission: Secondary | ICD-10-CM | POA: Diagnosis not present

## 2022-04-12 DIAGNOSIS — S0031XA Abrasion of nose, initial encounter: Secondary | ICD-10-CM | POA: Diagnosis not present

## 2022-04-12 MED ORDER — REPATHA SURECLICK 140 MG/ML ~~LOC~~ SOAJ: 140.0000 mg | SUBCUTANEOUS | 3 refills | Status: DC

## 2022-04-12 NOTE — Telephone Encounter (Signed)
Pt advised seeing if can do PA for repatha

## 2022-04-12 NOTE — Telephone Encounter (Signed)
Pt called in staying that she was here today, and the med(Evolocumab) that Dr. Nicki Reaper prescribed her, her insurance wont cover it. She said if she can go back to the med from before or sutn else her insurance would cover. She's available at [phone].

## 2022-04-12 NOTE — Progress Notes (Unsigned)
Subjective:    Patient ID: April Chapman, female    DOB: [birth year], 72 y.o.   MRN: [id number]  Patient here for  Chief Complaint  Patient presents with   Medical Management of Chronic Issues    HPI Here to follow up regarding her cholesterol.  Previous visit - right shoulder/arm pain. Evaluated - [facility].   Seeing Dr Leonard Schwartz for f/u multiple myellma.  Last evaluated 01/29/22.  Recommended f/u in 3 months. Saw Dr Gabriel Carina 02/19/22 - f/u thyroid nodule - stable.  Recommended f/u as needed.  She did fall a couple of days ago.  Had on flip flops.  Flip flop caught and she fell forward - rod - caughther nose.  Denies head injury.  Abrasions - nose.  No headache.  No dizziness or light headedness.  No other injuries.  No chest pain or sob reported.  No abdominal pain or bowel change reported.  She had previously has some right shoulder/upper arm pain.  Now with increased soft tissue - right wrist.  Discussed ortho referral.  No pain over wrist.   Past Medical History:  Diagnosis Date   Chronic kidney disease H/O   stones   History of chicken pox    Multiple myeloma ([facility])    s/p stem cell transplant (08/2010)   Past Surgical History:  Procedure Laterality Date   APPENDECTOMY  03/2015   APPENDECTOMY     Family History  Problem Relation Age of Onset   Mental illness Mother    Hypercholesterolemia Sister    Colon cancer Neg Hx    Breast cancer Neg Hx    Social History   Socioeconomic History   Marital status: Widowed    Spouse name: Not on file   Number of children: 2   Years of education: Not on file   Highest education level: Not on file  Occupational History   Occupation: retired    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[place]  Tobacco Use   Smoking status: Never   Smokeless tobacco: Never  Vaping Use   Vaping Use: Never used  Substance and Sexual Activity   Alcohol use: No   Drug use: No   Sexual activity: Never  Other Topics Concern   Not on file  Social History Narrative   Not on file    Social Determinants of Health   Financial Resource Strain: Low Risk  (09/27/2021)   Overall Financial Resource Strain (CARDIA)    Difficulty of Paying Living Expenses: Not hard at all  Food Insecurity: No Food Insecurity (09/27/2021)   Hunger Vital Sign    Worried About Running Out of Food in the Last Year: Never true    [place] in the Last Year: Never true  Transportation Needs: No Transportation Needs (09/27/2021)   PRAPARE - Hydrologist (Medical): No    Lack of Transportation (Non-Medical): No  Physical Activity: Sufficiently Active (09/27/2021)   Exercise Vital Sign    Days of Exercise per Week: 5 days    Minutes of Exercise per Session: 30 min  Stress: No Stress Concern Present (09/27/2021)   [place]    Feeling of Stress : Not at all  Social Connections: Unknown (09/27/2021)   Social Connection and Isolation Panel [NHANES]    Frequency of Communication with Friends and Family: More than three times a week    Frequency of Social Gatherings with Friends and Family: More than three times a  week    Attends Religious Services: Not on file    Active Member of Clubs or Organizations: Yes    Attends Archivist Meetings: Not on file    Marital Status: Widowed     Review of Systems  Constitutional:  Negative for appetite change and unexpected weight change.  HENT:  Negative for congestion and sinus pressure.   Respiratory:  Negative for cough, chest tightness and shortness of breath.   Cardiovascular:  Negative for chest pain, palpitations and leg swelling.  Gastrointestinal:  Negative for abdominal pain, diarrhea, nausea and vomiting.  Genitourinary:  Negative for difficulty urinating and dysuria.  Musculoskeletal:  Negative for joint swelling and myalgias.  Skin:  Negative for color change and rash.       Abrasion - nose.  Increased soft tissue mass - right wrist.    Neurological:  Negative for dizziness and headaches.  Psychiatric/Behavioral:  Negative for agitation and dysphoric mood.        Objective:     BP 126/70   Pulse 70   Ht '5\' 7"'$  (1.702 m)   Wt 177 lb (80.3 kg)   SpO2 98%   BMI 27.72 kg/m  Wt Readings from Last 3 Encounters:  04/12/22 177 lb (80.3 kg)  10/25/21 169 lb 3.2 oz (76.7 kg)  09/27/21 165 lb (74.8 kg)    Physical Exam Vitals reviewed.  Constitutional:      General: She is not in acute distress.    Appearance: Normal appearance.  HENT:     Head: Normocephalic and atraumatic.     Right Ear: External ear normal.     Left Ear: External ear normal.     Nose:     Comments: Abrasions - nose. No increased erythema.     Mouth/Throat:     Pharynx: No oropharyngeal exudate or posterior oropharyngeal erythema.  Eyes:     General: No scleral icterus.       Right eye: No discharge.        Left eye: No discharge.     Conjunctiva/sclera: Conjunctivae normal.  Neck:     Thyroid: No thyromegaly.  Cardiovascular:     Rate and Rhythm: Normal rate and regular rhythm.  Pulmonary:     Effort: No respiratory distress.     Breath sounds: Normal breath sounds. No wheezing.  Abdominal:     General: Bowel sounds are normal.     Palpations: Abdomen is soft.     Tenderness: There is no abdominal tenderness.  Musculoskeletal:        General: No swelling or tenderness.     Cervical back: Neck supple. No tenderness.  Lymphadenopathy:     Cervical: No cervical adenopathy.  Skin:    Findings: No erythema or rash.     Comments: Increased soft tissue mass - right wrist.   Neurological:     Mental Status: She is alert.  Psychiatric:        Mood and Affect: Mood normal.        Behavior: Behavior normal.      Outpatient Encounter Medications as of 04/12/2022  Medication Sig   Evolocumab (REPATHA SURECLICK) 810 MG/ML SOAJ Inject 140 mg into the skin every 14 (fourteen) days.   [DISCONTINUED] lovastatin (MEVACOR) 10 MG tablet Take  one tablet q Monday, Wednesday and Friday.   No facility-administered encounter medications on file as of 04/12/2022.     Lab Results  Component Value Date   WBC 8.1 06/10/2021   HGB  10.5 (L) 06/10/2021   HCT 31.9 (L) 06/10/2021   PLT 145 (L) 06/10/2021   GLUCOSE 94 10/24/2021   CHOL 239 (H) 02/21/2022   TRIG 87.0 02/21/2022   HDL 63.00 02/21/2022   LDLCALC 158 (H) 02/21/2022   ALT 12 02/20/2022   AST 17 02/20/2022   NA 137 10/24/2021   K 3.7 10/24/2021   CL 104 10/24/2021   CREATININE 0.85 10/24/2021   BUN 20 10/24/2021   CO2 28 10/24/2021   TSH 2.11 02/21/2022   INR 1.0 04/01/2014    No results found.     Assessment & Plan:  Hypercholesterolemia Assessment & Plan: Intolerance to crestor and pravastatin.  Also intolerant to lovastatin - constipation. Discussed labs and calculated cholesterol risk.   Low cholesterol diet and exercise. Discussed repatha.  She is agreeable.  Repatha '140mg'$  q 2 weeks.  Follow lipid panel and liver function tests.   Orders: -     Lipid panel; Future -     Hepatic function panel; Future -     Basic metabolic panel; Future  Anemia, unspecified type Assessment & Plan: Follow cbc.    Colon cancer screening Assessment & Plan: cologuard 12/20/20 - negative.    Multiple myeloma in remission [facility]) Assessment & Plan: Followed by Dr Katina Dung - recommended SPEP/IEF and serum free light chain testing q 3-6 months.     Right arm pain Assessment & Plan: 11/2021 - [facility] - adhesive capsulitis of right shoulder s/p injection. PT.  Now with increased soft tissue mass - right wrist.  Request referral back to ortho for evaluation.    Stress Assessment & Plan: Overall appears to be handling things relatively well.  Follow.    Thyroid nodule Assessment & Plan:  Saw Dr Gabriel Carina 02/19/22 - f/u thyroid nodule - stable.  Recommended f/u as needed.    Soft tissue mass Assessment & Plan: 11/2021 - [facility] - adhesive capsulitis of right shoulder  s/p injection. PT.  Now with increased soft tissue mass - right wrist.  Request referral back to ortho for evaluation.   Orders: -     Ambulatory referral to Orthopedic Surgery  Abrasion, nose w/o infection Assessment & Plan: Recent fall - nose injury as outlined.  Bactroban prn.   Follow.  Call with update.    Other orders -     Repatha SureClick; Inject 140 mg into the skin every 14 (fourteen) days.  Dispense: 2 mL; Refill: 3     Einar Pheasant, MD

## 2022-04-13 ENCOUNTER — Encounter: Payer: Self-pay | Admitting: Internal Medicine

## 2022-04-13 DIAGNOSIS — S0031XA Abrasion of nose, initial encounter: Secondary | ICD-10-CM | POA: Insufficient documentation

## 2022-04-13 DIAGNOSIS — M7989 Other specified soft tissue disorders: Secondary | ICD-10-CM | POA: Insufficient documentation

## 2022-04-13 NOTE — Assessment & Plan Note (Signed)
cologuard 12/20/20 - negative.

## 2022-04-13 NOTE — Assessment & Plan Note (Signed)
Followed by Dr Katina Dung - recommended SPEP/IEF and serum free light chain testing q 3-6 months.

## 2022-04-13 NOTE — Assessment & Plan Note (Signed)
11/2021 - [facility] - adhesive capsulitis of right shoulder s/p injection. PT.  Now with increased soft tissue mass - right wrist.  Request referral back to ortho for evaluation.

## 2022-04-13 NOTE — Assessment & Plan Note (Signed)
Recent fall - nose injury as outlined.  Bactroban prn.   Follow.  Call with update.

## 2022-04-13 NOTE — Assessment & Plan Note (Signed)
Saw Dr Gabriel Carina 02/19/22 - f/u thyroid nodule - stable.  Recommended f/u as needed.

## 2022-04-13 NOTE — Assessment & Plan Note (Signed)
Follow cbc.  

## 2022-04-13 NOTE — Assessment & Plan Note (Signed)
Overall appears to be handling things relatively well.  Follow.   

## 2022-04-13 NOTE — Assessment & Plan Note (Signed)
Intolerance to crestor and pravastatin.  Also intolerant to lovastatin - constipation. Discussed labs and calculated cholesterol risk.   Low cholesterol diet and exercise. Discussed repatha.  She is agreeable.  Repatha '140mg'$  q 2 weeks.  Follow lipid panel and liver function tests.

## 2022-04-22 ENCOUNTER — Other Ambulatory Visit (HOSPITAL_COMMUNITY): Payer: Self-pay

## 2022-04-22 NOTE — Telephone Encounter (Signed)
Pharmacy Patient Advocate Encounter   Received notification that prior authorization for Repatha SureClick '140MG'$ /ML auto-injectors is required/requested.  Per Test Claim: PA required   PA submitted on 04/22/22 to (ins) [employer] via CoverMyMeds Key [id number] Status is pending

## 2022-04-22 NOTE — Telephone Encounter (Signed)
Pt advised.

## 2022-04-22 NOTE — Telephone Encounter (Signed)
Patient Advocate Encounter  Prior Authorization for Repatha SureClick '140MG'$ /ML auto-injectors has been approved.    PA# [id number] Effective dates: 03/18/22 through 03/18/23

## 2022-04-23 ENCOUNTER — Telehealth: Payer: Self-pay

## 2022-04-23 DIAGNOSIS — G629 Polyneuropathy, unspecified: Secondary | ICD-10-CM | POA: Diagnosis not present

## 2022-04-23 DIAGNOSIS — T451X5A Adverse effect of antineoplastic and immunosuppressive drugs, initial encounter: Secondary | ICD-10-CM | POA: Diagnosis not present

## 2022-04-23 DIAGNOSIS — E7800 Pure hypercholesterolemia, unspecified: Secondary | ICD-10-CM

## 2022-04-23 DIAGNOSIS — C9000 Multiple myeloma not having achieved remission: Secondary | ICD-10-CM | POA: Diagnosis not present

## 2022-04-23 DIAGNOSIS — Z888 Allergy status to other drugs, medicaments and biological substances status: Secondary | ICD-10-CM | POA: Diagnosis not present

## 2022-04-23 NOTE — Telephone Encounter (Signed)
See if she would be agreeable for pt assistance - med management referral.

## 2022-04-23 NOTE — Telephone Encounter (Signed)
Signed

## 2022-04-23 NOTE — Telephone Encounter (Signed)
Pt agreeable. I have pended referral- please make sure referral is correct then sign.

## 2022-04-23 NOTE — Telephone Encounter (Signed)
Order placed for referral for pt assistance.

## 2022-04-23 NOTE — Telephone Encounter (Signed)
Patient went to pick up repatha and it was $533. Is there a way we can get it cheaper or should we reach out to pharmacy to see if she qualifies for any assistance?

## 2022-04-23 NOTE — Telephone Encounter (Signed)
Patient states she went to pick up her statin medication and it was going to cost her $533.00 per month.  Patient states she would like to see if Dr. Einar Pheasant will put her back on her previous medication or something else.

## 2022-04-29 ENCOUNTER — Other Ambulatory Visit: Payer: BLUE CROSS/BLUE SHIELD | Admitting: Pharmacist

## 2022-04-29 DIAGNOSIS — M25431 Effusion, right wrist: Secondary | ICD-10-CM | POA: Diagnosis not present

## 2022-04-29 NOTE — Progress Notes (Signed)
   04/29/2022 Name: April Chapman MRN: [id number] DOB: [birth year]  Chief Complaint  Patient presents with   Medication Management   Hyperlipidemia    April Chapman is a 72 y.o. year old female who presented for a telephone visit.   They were referred to the pharmacist by their PCP for assistance in managing hyperlipidemia and medication access.    Subjective:  Care Team: Primary Care Provider: Einar Pheasant, MD ; Next Scheduled Visit: 07/12/22  Medication Access/Adherence  Current Pharmacy:  [id number] - [address], [place] AT [facility] [address] [place] [address] Phone: [phone] Fax: [phone]   Patient reports affordability concerns with their medications: Yes  Patient reports access/transportation concerns to their pharmacy: No  Patient reports adherence concerns with their medications:  No     Hyperlipidemia/ASCVD Risk Reduction  Current lipid lowering medications: prescribed Repatha 140 mg Q14 days; PA approved and patient reports copay at her pharmacy was $544; could be partially related to deductible Medications tried in the past: rosuvastatin, pravastatin, lovastatin - constipation; muscle aches   Antiplatelet regimen: none  PREVENT Risk Score: 10 year risk of CVD: 9.2% - 10 year risk of ASCVD: 5.9% - 10 year risk of HF: 5.3%  Objective:  No results found for: "HGBA1C"  Lab Results  Component Value Date   CREATININE 0.85 10/24/2021   BUN 20 10/24/2021   NA 137 10/24/2021   K 3.7 10/24/2021   CL 104 10/24/2021   CO2 28 10/24/2021    Lab Results  Component Value Date   CHOL 239 (H) 02/21/2022   HDL 63.00 02/21/2022   LDLCALC 158 (H) 02/21/2022   TRIG 87.0 02/21/2022   CHOLHDL 4 02/21/2022    Medications Reviewed Today     Reviewed by Osker Mason, RPH-CPP (Pharmacist) on 04/29/22 at 1127  Med List Status: <None>   Medication Order Taking? Sig Documenting Provider  Last Dose Status Informant  Evolocumab (REPATHA SURECLICK) 732 MG/ML SOAJ [id number] No Inject 140 mg into the skin every 14 (fourteen) days.  Patient not taking: Reported on 04/29/2022   Einar Pheasant, MD Not Taking Active               Assessment/Plan:   Hyperlipidemia/ASCVD Risk Reduction: - Currently uncontrolled.  - Reviewed long term complications of uncontrolled cholesterol - Discussed Repatha. Discussed [place]. Patient already enrolled in [facility] for myeloma treatment, completed application for Hyperlipidemia today. Approved for [facility] for Hyperlipidemia 03/30/22-03/30/23. BIN: [id number]; PCN: [id number]; Group: [id number]; ID: [id number]. Called information to [employer].  - Counseled patient on Repatha injection. Patient and daughter will watch video on website and contact us with any questions.   Follow Up Plan: pharmacy needs met.   Catie Hedwig Morton, PharmD, [place], [facility] [phone]

## 2022-04-29 NOTE — Patient Instructions (Signed)
April Chapman,   It was great talking to you today!  You were approved for [place] funding for medications for hyperlipidemia from 03/30/22-03/30/23. BIN: [id number]; PCN: [id number]; Group: [id number]; ID: [id number] . I have provided this information to [facility] and you will get this in the mail in a few days.   The pharmacy should always run your insurance as "primary" and the above information as "secondary".   Please reach out with any future questions or concerns. Thanks!  Catie Hedwig Morton, PharmD, [place], [facility] [phone]

## 2022-06-03 ENCOUNTER — Telehealth: Payer: Self-pay | Admitting: Internal Medicine

## 2022-06-03 NOTE — Telephone Encounter (Signed)
Pt called wanting to know why the provider do test that is not covered by her insurance. Pt stated the provider should warn her before time.

## 2022-06-04 NOTE — Telephone Encounter (Signed)
Called patient to clarify. She received $129 bill but doesn't know why. Provided patient with number to billing dept.

## 2022-06-26 DIAGNOSIS — L570 Actinic keratosis: Secondary | ICD-10-CM | POA: Diagnosis not present

## 2022-06-26 DIAGNOSIS — L578 Other skin changes due to chronic exposure to nonionizing radiation: Secondary | ICD-10-CM | POA: Diagnosis not present

## 2022-06-26 DIAGNOSIS — D485 Neoplasm of uncertain behavior of skin: Secondary | ICD-10-CM | POA: Diagnosis not present

## 2022-06-26 DIAGNOSIS — Z872 Personal history of diseases of the skin and subcutaneous tissue: Secondary | ICD-10-CM | POA: Diagnosis not present

## 2022-06-26 DIAGNOSIS — Z859 Personal history of malignant neoplasm, unspecified: Secondary | ICD-10-CM | POA: Diagnosis not present

## 2022-07-09 ENCOUNTER — Other Ambulatory Visit (INDEPENDENT_AMBULATORY_CARE_PROVIDER_SITE_OTHER): Payer: Medicare Other

## 2022-07-09 DIAGNOSIS — E7800 Pure hypercholesterolemia, unspecified: Secondary | ICD-10-CM | POA: Diagnosis not present

## 2022-07-09 LAB — LIPID PANEL
Cholesterol: 154 mg/dL (ref 0–200)
HDL: 49.5 mg/dL (ref >39.00)
LDL Cholesterol: 84 mg/dL (ref 0–99)
NonHDL: 104.77
Total CHOL/HDL Ratio: 3
Triglycerides: 103 mg/dL (ref 0.0–149.0)
VLDL: 20.6 mg/dL (ref 0.0–40.0)

## 2022-07-09 LAB — BASIC METABOLIC PANEL
BUN: 19 mg/dL (ref 6–23)
CO2: 27 mEq/L (ref 19–32)
Calcium: 9.3 mg/dL (ref 8.4–10.5)
Chloride: 105 mEq/L (ref 96–112)
Creatinine, Ser: 0.86 mg/dL (ref 0.40–1.20)
GFR: 68.02 mL/min (ref >60.00)
Glucose, Bld: 77 mg/dL (ref 70–99)
Potassium: 3.7 mEq/L (ref 3.5–5.1)
Sodium: 140 mEq/L (ref 135–145)

## 2022-07-09 LAB — HEPATIC FUNCTION PANEL
ALT: 10 U/L (ref 0–35)
AST: 17 U/L (ref 0–37)
Albumin: 4 g/dL (ref 3.5–5.2)
Alkaline Phosphatase: 58 U/L (ref 39–117)
Bilirubin, Direct: 0.1 mg/dL (ref 0.0–0.3)
Total Bilirubin: 0.5 mg/dL (ref 0.2–1.2)
Total Protein: 6.7 g/dL (ref 6.0–8.3)

## 2022-07-12 ENCOUNTER — Ambulatory Visit: Payer: BLUE CROSS/BLUE SHIELD | Admitting: Internal Medicine

## 2022-07-16 DIAGNOSIS — C9000 Multiple myeloma not having achieved remission: Secondary | ICD-10-CM | POA: Diagnosis not present

## 2022-07-17 DIAGNOSIS — C9000 Multiple myeloma not having achieved remission: Secondary | ICD-10-CM | POA: Diagnosis not present

## 2022-07-18 ENCOUNTER — Encounter: Payer: Self-pay | Admitting: Internal Medicine

## 2022-07-18 ENCOUNTER — Ambulatory Visit (INDEPENDENT_AMBULATORY_CARE_PROVIDER_SITE_OTHER): Payer: Medicare Other | Admitting: Internal Medicine

## 2022-07-18 VITALS — BP 116/74 | HR 67 | Temp 97.9°F | Ht 67.0 in | Wt 173.6 lb

## 2022-07-18 DIAGNOSIS — C9001 Multiple myeloma in remission: Secondary | ICD-10-CM | POA: Diagnosis not present

## 2022-07-18 DIAGNOSIS — D649 Anemia, unspecified: Secondary | ICD-10-CM

## 2022-07-18 DIAGNOSIS — E7800 Pure hypercholesterolemia, unspecified: Secondary | ICD-10-CM

## 2022-07-18 DIAGNOSIS — E041 Nontoxic single thyroid nodule: Secondary | ICD-10-CM | POA: Diagnosis not present

## 2022-07-18 DIAGNOSIS — F439 Reaction to severe stress, unspecified: Secondary | ICD-10-CM

## 2022-07-18 DIAGNOSIS — Z1211 Encounter for screening for malignant neoplasm of colon: Secondary | ICD-10-CM

## 2022-07-18 DIAGNOSIS — M7989 Other specified soft tissue disorders: Secondary | ICD-10-CM | POA: Diagnosis not present

## 2022-07-18 MED ORDER — REPATHA SURECLICK 140 MG/ML ~~LOC~~ SOAJ: 140.0000 mg | SUBCUTANEOUS | 6 refills | Status: DC

## 2022-07-18 NOTE — Progress Notes (Signed)
Subjective:    Patient ID: April Chapman, female    DOB: Dec 04, 1950, 72 y.o.   MRN: [id number]  Patient here for  Chief Complaint  Patient presents with   Medical Management of Chronic Issues    3 month follow up     HPI Here to follow up regarding her cholesterol.  Seeing Dr Westley Foots for f/u multiple myeloma. Last evaluated 07/17/22.  Stable.  Overall doing well.  Feels good. Tries to stay active.  No chest pain or sob reported.  No cough or congestion.  No abdominal pain or bowel change reported. Started on repatha last visit. Tolerating.    Past Medical History:  Diagnosis Date   Chronic kidney disease H/O   stones   History of chicken pox    Multiple myeloma (HCC)    s/p stem cell transplant (08/2010)   Past Surgical History:  Procedure Laterality Date   APPENDECTOMY  03/2015   APPENDECTOMY     Family History  Problem Relation Age of Onset   Mental illness Mother    Hypercholesterolemia Sister    Colon cancer Neg Hx    Breast cancer Neg Hx    Social History   Socioeconomic History   Marital status: Widowed    Spouse name: Not on file   Number of children: 2   Years of education: Not on file   Highest education level: Not on file  Occupational History   Occupation: retired    Associate Professor: [employer]  Tobacco Use   Smoking status: Never   Smokeless tobacco: Never  Vaping Use   Vaping Use: Never used  Substance and Sexual Activity   Alcohol use: No   Drug use: No   Sexual activity: Never  Other Topics Concern   Not on file  Social History Narrative   Not on file   Social Determinants of Health   Financial Resource Strain: Low Risk  (09/27/2021)   Overall Financial Resource Strain (CARDIA)    Difficulty of Paying Living Expenses: Not hard at all  Food Insecurity: No Food Insecurity (09/27/2021)   Hunger Vital Sign    Worried About Running Out of Food in the Last Year: Never true    Ran Out of Food in the Last Year: Never true  Transportation Needs:  No Transportation Needs (09/27/2021)   PRAPARE - Administrator, Civil Service (Medical): No    Lack of Transportation (Non-Medical): No  Physical Activity: Sufficiently Active (09/27/2021)   Exercise Vital Sign    Days of Exercise per Week: 5 days    Minutes of Exercise per Session: 30 min  Stress: No Stress Concern Present (09/27/2021)   [employer] of Occupational Health - Occupational Stress Questionnaire    Feeling of Stress : Not at all  Social Connections: Unknown (09/27/2021)   Social Connection and Isolation Panel [NHANES]    Frequency of Communication with Friends and Family: More than three times a week    Frequency of Social Gatherings with Friends and Family: More than three times a week    Attends Religious Services: Not on file    Active Member of Clubs or Organizations: Yes    Attends Banker Meetings: Not on file    Marital Status: Widowed     Review of Systems  Constitutional:  Negative for appetite change and unexpected weight change.  HENT:  Negative for congestion and sinus pressure.   Respiratory:  Negative for cough, chest tightness and shortness of  breath.   Cardiovascular:  Negative for chest pain and palpitations.  Gastrointestinal:  Negative for abdominal pain, diarrhea, nausea and vomiting.  Genitourinary:  Negative for difficulty urinating and dysuria.  Musculoskeletal:  Negative for joint swelling and myalgias.  Skin:  Negative for color change and rash.  Neurological:  Negative for dizziness and headaches.  Psychiatric/Behavioral:  Negative for agitation and dysphoric mood.        Objective:     BP 116/74   Pulse 67   Temp 97.9 F (36.6 C) (Oral)   Ht 5\' 7"  (1.702 m)   Wt 173 lb 9.6 oz (78.7 kg)   SpO2 98%   BMI 27.19 kg/m  Wt Readings from Last 3 Encounters:  07/18/22 173 lb 9.6 oz (78.7 kg)  04/12/22 177 lb (80.3 kg)  10/25/21 169 lb 3.2 oz (76.7 kg)    Physical Exam Vitals reviewed.  Constitutional:       General: She is not in acute distress.    Appearance: Normal appearance.  HENT:     Head: Normocephalic and atraumatic.     Right Ear: External ear normal.     Left Ear: External ear normal.  Eyes:     General: No scleral icterus.       Right eye: No discharge.        Left eye: No discharge.     Conjunctiva/sclera: Conjunctivae normal.  Neck:     Thyroid: No thyromegaly.  Cardiovascular:     Rate and Rhythm: Normal rate and regular rhythm.  Pulmonary:     Effort: No respiratory distress.     Breath sounds: Normal breath sounds. No wheezing.  Abdominal:     General: Bowel sounds are normal.     Palpations: Abdomen is soft.     Tenderness: There is no abdominal tenderness.  Musculoskeletal:        General: No swelling or tenderness.     Cervical back: Neck supple. No tenderness.  Lymphadenopathy:     Cervical: No cervical adenopathy.  Skin:    Findings: No erythema or rash.  Neurological:     Mental Status: She is alert.  Psychiatric:        Mood and Affect: Mood normal.        Behavior: Behavior normal.      Outpatient Encounter Medications as of 07/18/2022  Medication Sig   [DISCONTINUED] Evolocumab (REPATHA SURECLICK) 140 MG/ML SOAJ Inject 140 mg into the skin every 14 (fourteen) days.   Evolocumab (REPATHA SURECLICK) 140 MG/ML SOAJ Inject 140 mg into the skin every 14 (fourteen) days.   No facility-administered encounter medications on file as of 07/18/2022.     Lab Results  Component Value Date   WBC 8.1 06/10/2021   HGB 10.5 (L) 06/10/2021   HCT 31.9 (L) 06/10/2021   PLT 145 (L) 06/10/2021   GLUCOSE 77 07/09/2022   CHOL 154 07/09/2022   TRIG 103.0 07/09/2022   HDL 49.50 07/09/2022   LDLCALC 84 07/09/2022   ALT 10 07/09/2022   AST 17 07/09/2022   NA 140 07/09/2022   K 3.7 07/09/2022   CL 105 07/09/2022   CREATININE 0.86 07/09/2022   BUN 19 07/09/2022   CO2 27 07/09/2022   TSH 2.11 02/21/2022   INR 1.0 04/01/2014    No results found.      Assessment & Plan:  Hypercholesterolemia Assessment & Plan: Intolerance to crestor and pravastatin.  Also intolerant to lovastatin - constipation. Discussed labs and calculated cholesterol risk previously.  Low cholesterol diet and exercise. Decided to start repatha. Repatha 140mg  q 2 weeks.  Doing well. Follow lipid panel and liver function tests.   Orders: -     Lipid panel; Future -     Basic metabolic panel; Future -     Hepatic function panel; Future  Anemia, unspecified type Assessment & Plan: Follow cbc.    Colon cancer screening Assessment & Plan: cologuard 12/20/20 - negative.    Multiple myeloma in remission [facility]) Assessment & Plan: Followed by Dr Leotis Pain - recommended SPEP/IEF and serum free light chain testing q 3-6 months.     Soft tissue mass Assessment & Plan: Saw ortho (04/2022)- discussed MRI.  Follow.    Stress Assessment & Plan: Overall appears to be handling things relatively well.  Follow.    Thyroid nodule Assessment & Plan:  Saw Dr Tedd Sias 02/19/22 - f/u thyroid nodule - stable.  Recommended f/u as needed.    Other orders -     Repatha SureClick; Inject 140 mg into the skin every 14 (fourteen) days.  Dispense: 2 mL; Refill: 6     Dale Glen Ullin, MD

## 2022-07-21 ENCOUNTER — Encounter: Payer: Self-pay | Admitting: Internal Medicine

## 2022-07-21 NOTE — Assessment & Plan Note (Signed)
Follow cbc.  

## 2022-07-21 NOTE — Assessment & Plan Note (Signed)
Followed by Dr Van Deventer - recommended SPEP/IEF and serum free light chain testing q 3-6 months.   

## 2022-07-21 NOTE — Assessment & Plan Note (Signed)
Saw Dr Solum 02/19/22 - f/u thyroid nodule - stable.  Recommended f/u as needed.  

## 2022-07-21 NOTE — Assessment & Plan Note (Signed)
cologuard 12/20/20 - negative.  

## 2022-07-21 NOTE — Assessment & Plan Note (Signed)
Overall appears to be handling things relatively well.  Follow.   

## 2022-07-21 NOTE — Assessment & Plan Note (Signed)
Intolerance to crestor and pravastatin.  Also intolerant to lovastatin - constipation. Discussed labs and calculated cholesterol risk previously.   Low cholesterol diet and exercise. Decided to start repatha. Repatha 140mg  q 2 weeks.  Doing well. Follow lipid panel and liver function tests.

## 2022-07-21 NOTE — Assessment & Plan Note (Signed)
Saw ortho (04/2022)- discussed MRI.  Follow.

## 2022-07-26 DIAGNOSIS — J302 Other seasonal allergic rhinitis: Secondary | ICD-10-CM | POA: Diagnosis not present

## 2022-08-19 DIAGNOSIS — B078 Other viral warts: Secondary | ICD-10-CM | POA: Diagnosis not present

## 2022-08-19 DIAGNOSIS — L570 Actinic keratosis: Secondary | ICD-10-CM | POA: Diagnosis not present

## 2022-10-15 DIAGNOSIS — M7989 Other specified soft tissue disorders: Secondary | ICD-10-CM | POA: Diagnosis not present

## 2022-10-15 DIAGNOSIS — C9000 Multiple myeloma not having achieved remission: Secondary | ICD-10-CM | POA: Diagnosis not present

## 2022-10-15 DIAGNOSIS — T451X5A Adverse effect of antineoplastic and immunosuppressive drugs, initial encounter: Secondary | ICD-10-CM | POA: Diagnosis not present

## 2022-10-15 DIAGNOSIS — G620 Drug-induced polyneuropathy: Secondary | ICD-10-CM | POA: Diagnosis not present

## 2022-10-21 ENCOUNTER — Telehealth: Payer: Self-pay | Admitting: Internal Medicine

## 2022-10-21 NOTE — Telephone Encounter (Signed)
Received notification - overdue mammogram.  Need to schedule.

## 2022-10-22 NOTE — Telephone Encounter (Signed)
Noted,

## 2022-10-22 NOTE — Telephone Encounter (Signed)
LMTCB

## 2022-10-22 NOTE — Telephone Encounter (Signed)
Patient returned office phone call and note was read from Dr Lorin Picket. Patient stated she is getting ready to travel for 3 weeks and will call and schedule her mammogram when she comes back.

## 2022-10-23 ENCOUNTER — Telehealth: Payer: Self-pay | Admitting: *Deleted

## 2022-10-23 ENCOUNTER — Ambulatory Visit: Payer: Medicare Other | Admitting: *Deleted

## 2022-10-23 VITALS — Ht 67.0 in | Wt 177.0 lb

## 2022-10-23 DIAGNOSIS — Z0000 Encounter for general adult medical examination without abnormal findings: Secondary | ICD-10-CM | POA: Diagnosis not present

## 2022-10-23 NOTE — Patient Instructions (Signed)
April Chapman , Thank you for taking time to come for your Medicare Wellness Visit. I appreciate your ongoing commitment to your health goals. Please review the following plan we discussed and let me know if I can assist you in the future.   Referrals/Orders/Follow-Ups/Clinician Recommendations: None  This is a list of the screening recommended for you and due dates:  Health Maintenance  Topic Date Due   Flu Shot  10/17/2022   Mammogram  04/13/2023*   Medicare Annual Wellness Visit  10/23/2023   Cologuard (Stool DNA test)  12/21/2023   DTaP/Tdap/Td vaccine (9 - Td or Tdap) 10/30/2029   Pneumonia Vaccine  Completed   DEXA scan (bone density measurement)  Completed   Hepatitis C Screening  Completed   Zoster (Shingles) Vaccine  Completed   HPV Vaccine  Aged Out   COVID-19 Vaccine  Discontinued  *Topic was postponed. The date shown is not the original due date.    Advanced directives: (Copy Requested) Please bring a copy of your health care power of attorney and living will to the office to be added to your chart at your convenience.  Next Medicare Annual Wellness Visit scheduled for next year: Yes 10/27/23 @ 2:30  Preventive Care 65 Years and Older, Female Preventive care refers to lifestyle choices and visits with your health care provider that can promote health and wellness. What does preventive care include? A yearly physical exam. This is also called an annual well check. Dental exams once or twice a year. Routine eye exams. Ask your health care provider how often you should have your eyes checked. Personal lifestyle choices, including: Daily care of your teeth and gums. Regular physical activity. Eating a healthy diet. Avoiding tobacco and drug use. Limiting alcohol use. Practicing safe sex. Taking low-dose aspirin every day. Taking vitamin and mineral supplements as recommended by your health care provider. What happens during an annual well check? The services and  screenings done by your health care provider during your annual well check will depend on your age, overall health, lifestyle risk factors, and family history of disease. Counseling  Your health care provider may ask you questions about your: Alcohol use. Tobacco use. Drug use. Emotional well-being. Home and relationship well-being. Sexual activity. Eating habits. History of falls. Memory and ability to understand (cognition). Work and work Astronomer. Reproductive health. Screening  You may have the following tests or measurements: Height, weight, and BMI. Blood pressure. Lipid and cholesterol levels. These may be checked every 5 years, or more frequently if you are over 40 years old. Skin check. Lung cancer screening. You may have this screening every year starting at age 66 if you have a 30-pack-year history of smoking and currently smoke or have quit within the past 15 years. Fecal occult blood test (FOBT) of the stool. You may have this test every year starting at age 59. Flexible sigmoidoscopy or colonoscopy. You may have a sigmoidoscopy every 5 years or a colonoscopy every 10 years starting at age 67. Hepatitis C blood test. Hepatitis B blood test. Sexually transmitted disease (STD) testing. Diabetes screening. This is done by checking your blood sugar (glucose) after you have not eaten for a while (fasting). You may have this done every 1-3 years. Bone density scan. This is done to screen for osteoporosis. You may have this done starting at age 14. Mammogram. This may be done every 1-2 years. Talk to your health care provider about how often you should have regular mammograms. Talk with your  health care provider about your test results, treatment options, and if necessary, the need for more tests. Vaccines  Your health care provider may recommend certain vaccines, such as: Influenza vaccine. This is recommended every year. Tetanus, diphtheria, and acellular pertussis (Tdap,  Td) vaccine. You may need a Td booster every 10 years. Zoster vaccine. You may need this after age 49. Pneumococcal 13-valent conjugate (PCV13) vaccine. One dose is recommended after age 30. Pneumococcal polysaccharide (PPSV23) vaccine. One dose is recommended after age 58. Talk to your health care provider about which screenings and vaccines you need and how often you need them. This information is not intended to replace advice given to you by your health care provider. Make sure you discuss any questions you have with your health care provider. Document Released: 03/31/2015 Document Revised: 11/22/2015 Document Reviewed: 01/03/2015 Elsevier Interactive Patient Education  2017 [employer].  Fall Prevention in the Home Falls can cause injuries. They can happen to people of all ages. There are many things you can do to make your home safe and to help prevent falls. What can I do on the outside of my home? Regularly fix the edges of walkways and driveways and fix any cracks. Remove anything that might make you trip as you walk through a door, such as a raised step or threshold. Trim any bushes or trees on the path to your home. Use bright outdoor lighting. Clear any walking paths of anything that might make someone trip, such as rocks or tools. Regularly check to see if handrails are loose or broken. Make sure that both sides of any steps have handrails. Any raised decks and porches should have guardrails on the edges. Have any leaves, snow, or ice cleared regularly. Use sand or salt on walking paths during winter. Clean up any spills in your garage right away. This includes oil or grease spills. What can I do in the bathroom? Use night lights. Install grab bars by the toilet and in the tub and shower. Do not use towel bars as grab bars. Use non-skid mats or decals in the tub or shower. If you need to sit down in the shower, use a plastic, non-slip stool. Keep the floor dry. Clean up any  water that spills on the floor as soon as it happens. Remove soap buildup in the tub or shower regularly. Attach bath mats securely with double-sided non-slip rug tape. Do not have throw rugs and other things on the floor that can make you trip. What can I do in the bedroom? Use night lights. Make sure that you have a light by your bed that is easy to reach. Do not use any sheets or blankets that are too big for your bed. They should not hang down onto the floor. Have a firm chair that has side arms. You can use this for support while you get dressed. Do not have throw rugs and other things on the floor that can make you trip. What can I do in the kitchen? Clean up any spills right away. Avoid walking on wet floors. Keep items that you use a lot in easy-to-reach places. If you need to reach something above you, use a strong step stool that has a grab bar. Keep electrical cords out of the way. Do not use floor polish or wax that makes floors slippery. If you must use wax, use non-skid floor wax. Do not have throw rugs and other things on the floor that can make you trip. What  can I do with my stairs? Do not leave any items on the stairs. Make sure that there are handrails on both sides of the stairs and use them. Fix handrails that are broken or loose. Make sure that handrails are as long as the stairways. Check any carpeting to make sure that it is firmly attached to the stairs. Fix any carpet that is loose or worn. Avoid having throw rugs at the top or bottom of the stairs. If you do have throw rugs, attach them to the floor with carpet tape. Make sure that you have a light switch at the top of the stairs and the bottom of the stairs. If you do not have them, ask someone to add them for you. What else can I do to help prevent falls? Wear shoes that: Do not have high heels. Have rubber bottoms. Are comfortable and fit you well. Are closed at the toe. Do not wear sandals. If you use a  stepladder: Make sure that it is fully opened. Do not climb a closed stepladder. Make sure that both sides of the stepladder are locked into place. Ask someone to hold it for you, if possible. Clearly mark and make sure that you can see: Any grab bars or handrails. First and last steps. Where the edge of each step is. Use tools that help you move around (mobility aids) if they are needed. These include: Canes. Walkers. Scooters. Crutches. Turn on the lights when you go into a dark area. Replace any light bulbs as soon as they burn out. Set up your furniture so you have a clear path. Avoid moving your furniture around. If any of your floors are uneven, fix them. If there are any pets around you, be aware of where they are. Review your medicines with your doctor. Some medicines can make you feel dizzy. This can increase your chance of falling. Ask your doctor what other things that you can do to help prevent falls. This information is not intended to replace advice given to you by your health care provider. Make sure you discuss any questions you have with your health care provider. Document Released: 12/29/2008 Document Revised: 08/10/2015 Document Reviewed: 04/08/2014 Elsevier Interactive Patient Education  2017 [employer].

## 2022-10-23 NOTE — Telephone Encounter (Signed)
Would recommend her retesting for covid.  Agree with hydration, rest.

## 2022-10-23 NOTE — Progress Notes (Signed)
Subjective:   April Chapman is a 72 y.o. female who presents for Medicare Annual (Subsequent) preventive examination.  Visit Complete: Virtual  I connected with  April Chapman on 10/23/22 by a audio enabled telemedicine application and verified that I am speaking with the correct person using two identifiers.  Patient Location: Home  Provider Location: Office/Clinic  I discussed the limitations of evaluation and management by telemedicine. The patient expressed understanding and agreed to proceed.  Vital Signs: Unable to obtain new vitals due to this being a telehealth visit.   Review of Systems     Cardiac Risk Factors include: advanced age (>2men, >37 women);dyslipidemia;Other (see comment), Risk factor comments: tachycardia     Objective:    Today's Vitals   10/23/22 1407 10/23/22 1408  Weight: 177 lb (80.3 kg)   Height: 5\' 7"  (1.702 m)   PainSc:  4    Body mass index is 27.72 kg/m.     10/23/2022    2:29 PM 09/27/2021   10:41 AM 09/14/2021    8:36 AM 06/07/2021   10:10 PM 06/07/2021    5:39 PM 04/24/2021    9:54 PM 09/26/2020   10:50 AM  Advanced Directives  Does Patient Have a Medical Advance Directive? Yes Yes No Yes Yes No Yes  Type of Estate agent of [place];Living will Healthcare Power of [place];Living will  Living will Living will  Healthcare Power of [place];Living will  Does patient want to make changes to medical advance directive?  No - Patient declined  No - Patient declined No - Patient declined  No - Patient declined  Copy of Healthcare Power of Attorney in Chart? No - copy requested No - copy requested     No - copy requested  Would patient like information on creating a medical advance directive?    No - Patient declined No - Patient declined      Current Medications (verified) Outpatient Encounter Medications as of 10/23/2022  Medication Sig   Evolocumab (REPATHA SURECLICK) 140 MG/ML SOAJ Inject 140 mg into the skin every  14 (fourteen) days.   No facility-administered encounter medications on file as of 10/23/2022.    Allergies (verified) Revlimid [lenalidomide] and Pomalidomide   History: Past Medical History:  Diagnosis Date   Chronic kidney disease H/O   stones   History of chicken pox    Multiple myeloma (HCC)    s/p stem cell transplant (08/2010)   Past Surgical History:  Procedure Laterality Date   APPENDECTOMY  03/2015   APPENDECTOMY     Family History  Problem Relation Age of Onset   Mental illness Mother    Hypercholesterolemia Sister    Colon cancer Neg Hx    Breast cancer Neg Hx    Social History   Socioeconomic History   Marital status: Widowed    Spouse name: Not on file   Number of children: 2   Years of education: Not on file   Highest education level: Not on file  Occupational History   Occupation: retired    Associate Professor: [employer]  Tobacco Use   Smoking status: Never   Smokeless tobacco: Never  Vaping Use   Vaping status: Never Used  Substance and Sexual Activity   Alcohol use: No   Drug use: No   Sexual activity: Never  Other Topics Concern   Not on file  Social History Narrative   Not on file   Social Determinants of Corporate investment banker  Strain: Low Risk  (10/23/2022)   Overall Financial Resource Strain (CARDIA)    Difficulty of Paying Living Expenses: Not hard at all  Food Insecurity: No Food Insecurity (10/23/2022)   Hunger Vital Sign    Worried About Running Out of Food in the Last Year: Never true    Ran Out of Food in the Last Year: Never true  Transportation Needs: No Transportation Needs (10/23/2022)   PRAPARE - Administrator, Civil Service (Medical): No    Lack of Transportation (Non-Medical): No  Physical Activity: Insufficiently Active (10/23/2022)   Exercise Vital Sign    Days of Exercise per Week: 4 days    Minutes of Exercise per Session: 20 min  Stress: No Stress Concern Present (10/23/2022)   [employer] of  Occupational Health - Occupational Stress Questionnaire    Feeling of Stress : Not at all  Social Connections: Moderately Isolated (10/23/2022)   Social Connection and Isolation Panel [NHANES]    Frequency of Communication with Friends and Family: More than three times a week    Frequency of Social Gatherings with Friends and Family: More than three times a week    Attends Religious Services: More than 4 times per year    Active Member of [employer] or Organizations: No    Attends Banker Meetings: Never    Marital Status: Widowed    Tobacco Counseling Counseling given: Not Answered   Clinical Intake:  Pre-visit preparation completed: Yes  Pain : 0-10 Pain Score: 4  Pain Type: Acute pain Pain Location: Head Pain Descriptors / Indicators: Dull Pain Onset: Yesterday Pain Frequency: Constant     BMI - recorded: 27.72 Nutritional Status: BMI 25 -29 Overweight Nutritional Risks: None Diabetes: No  How often do you need to have someone help you when you read instructions, pamphlets, or other written materials from your doctor or pharmacy?: 1 - Never  Interpreter Needed?: No  Information entered by :: R. LPN   Activities of Daily Living    10/23/2022    2:11 PM  In your present state of health, do you have any difficulty performing the following activities:  Hearing? 0  Vision? 0  Difficulty concentrating or making decisions? 0  Walking or climbing stairs? 0  Dressing or bathing? 0  Doing errands, shopping? 0  Preparing Food and eating ? N  Using the Toilet? N  In the past six months, have you accidently leaked urine? N  Do you have problems with loss of bowel control? N  Managing your Medications? N  Managing your Finances? N  Housekeeping or managing your Housekeeping? N    Patient Care Team: Dale Palominas, MD as PCP - General (Internal Medicine)  Indicate any recent Medical Services you may have received from other than [facility] providers in the past  year (date may be approximate).     Assessment:   This is a routine wellness examination for April Chapman.  Hearing/Vision screen Hearing Screening - Comments:: No issues Vision Screening - Comments:: No issues  Dietary issues and exercise activities discussed:     Goals Addressed             This Visit's Progress    Patient Stated       Wants to exercise and eat better       Depression Screen    10/23/2022    2:24 PM 07/18/2022   11:30 AM 10/25/2021    2:09 PM 09/27/2021   10:39 AM 09/26/2020  10:47 AM 09/24/2019   10:47 AM 04/12/2019    1:56 PM  PHQ 2/9 Scores  PHQ - 2 Score 0 0 0 0 0 0 0  PHQ- 9 Score 3          Fall Risk    10/23/2022    2:20 PM 07/18/2022   11:30 AM 10/25/2021    2:09 PM 09/27/2021   10:41 AM 09/26/2020   10:54 AM  Fall Risk   Falls in the past year? 0 0 0 0 0  Number falls in past yr: 0 0 0  0  Injury with Fall? 0 0 0  0  Risk for fall due to : No Fall Risks No Fall Risks No Fall Risks    Follow up Falls prevention discussed;Falls evaluation completed Falls evaluation completed Falls evaluation completed Falls evaluation completed Falls evaluation completed    MEDICARE RISK AT HOME:  Medicare Risk at Home - 10/23/22 1421     Any stairs in or around the home? Yes    If so, are there any without handrails? No    Home free of loose throw rugs in walkways, pet beds, electrical cords, etc? Yes    Adequate lighting in your home to reduce risk of falls? Yes    Life alert? No    Use of a cane, walker or w/c? No    Grab bars in the bathroom? No    Shower chair or bench in shower? No    Elevated toilet seat or a handicapped toilet? No               Cognitive Function:    09/24/2019   11:13 AM  MMSE - Mini Mental State Exam  Not completed: Unable to complete        10/23/2022    2:30 PM 09/23/2018   10:43 AM  6CIT Screen  What Year?  0 points  What month?  0 points  What time? 0 points 0 points  Count back from 20 0 points 0 points  Months  in reverse 0 points 0 points  Repeat phrase 0 points 0 points  Total Score  0 points    Immunizations Immunization History  Administered Date(s) Administered   DTaP 05/29/2011, 08/15/2011, 12/06/2011, 02/20/2012   DTaP / Hep B / IPV 05/15/2017, 11/06/2017, 10/01/2018   HIB (PRP-T) 05/15/2017, 11/06/2017, 10/01/2018   Hepatitis B, ADULT 05/29/2011, 08/15/2011, 12/06/2011   IPV 05/29/2011, 08/15/2011, 12/06/2011   Influenza,inj,Quad PF,6+ Mos 03/23/2015   MMR 03/23/2015, 04/08/2019   Pneumococcal Conjugate-13 05/29/2011, 08/15/2011, 12/06/2011, 02/20/2012, 05/15/2017, 11/06/2017, 10/01/2018   Pneumococcal Polysaccharide-23 03/23/2015, 04/08/2019   Tdap 10/31/2019   Zoster Recombinant(Shingrix) 05/15/2017, 11/06/2017    TDAP status: Up to date  Flu Vaccine status: Declined, Education has been provided regarding the importance of this vaccine but patient still declined. Advised may receive this vaccine at local pharmacy or Health Dept. Aware to provide a copy of the vaccination record if obtained from local pharmacy or Health Dept. Verbalized acceptance and understanding.  Pneumococcal vaccine status: Up to date  Covid-19 vaccine status: Declined, Education has been provided regarding the importance of this vaccine but patient still declined. Advised may receive this vaccine at local pharmacy or Health Dept.or vaccine clinic. Aware to provide a copy of the vaccination record if obtained from local pharmacy or Health Dept. Verbalized acceptance and understanding.  Qualifies for Shingles Vaccine? Yes   Zostavax completed No   Shingrix Completed?: Yes  Screening Tests Health Maintenance  Topic Date Due   Medicare Annual Wellness (AWV)  09/28/2022   INFLUENZA VACCINE  10/17/2022   MAMMOGRAM  04/13/2023 (Originally 04/07/2021)   Fecal DNA (Cologuard)  12/21/2023   DTaP/Tdap/Td (9 - Td or Tdap) 10/30/2029   Pneumonia Vaccine 43+ Years old  Completed   DEXA SCAN  Completed   Hepatitis  C Screening  Completed   Zoster Vaccines- Shingrix  Completed   HPV VACCINES  Aged Out   COVID-19 Vaccine  Discontinued    Health Maintenance  Health Maintenance Due  Topic Date Due   Medicare Annual Wellness (AWV)  09/28/2022   INFLUENZA VACCINE  10/17/2022    Colorectal cancer screening: Type of screening: Cologuard. Completed 10/22. Repeat every 3 years  Mammogram status: Completed 8/23. Repeat every year  Bone Density Completed 12/19 results osteopenia  repeat in 2 years. Patient stated that she had a full body scan at [facility] 2024  Lung Cancer Screening: (Low Dose CT Chest recommended if Age 64-80 years, 20 pack-year currently smoking OR have quit w/in 15years.) does not qualify.    Additional Screening:  Hepatitis C Screening: does qualify; Completed 10/18  Vision Screening: Recommended annual ophthalmology exams for early detection of glaucoma and other disorders of the eye. Is the patient up to date with their annual eye exam?  Yes  Who is the provider or what is the name of the office in which the patient attends annual eye exams? [facility] has upcoming appointment scheduled If pt is not established with a provider, would they like to be referred to a provider to establish care? No .   Dental Screening: Recommended annual dental exams for proper oral hygiene   Community Resource Referral / Chronic Care Management: CRR required this visit?  No   CCM required this visit?  No     Plan:     I have personally reviewed and noted the following in the patient's chart:   Medical and social history Use of alcohol, tobacco or illicit drugs  Current medications and supplements including opioid prescriptions. Patient is not currently taking opioid prescriptions. Functional ability and status Nutritional status Physical activity Advanced directives List of other physicians Hospitalizations, surgeries, and ER visits in previous 12 months Vitals Screenings to  include cognitive, depression, and falls Referrals and appointments  In addition, I have reviewed and discussed with patient certain preventive protocols, quality metrics, and best practice recommendations. A written personalized care plan for preventive services as well as general preventive health recommendations were provided to patient.     Sydell Axon, LPN   06/19/345   After Visit Summary: (MyChart) Due to this being a telephonic visit, the after visit summary with patients personalized plan was offered to patient via MyChart   Nurse Notes: Phone note sending to PCP.

## 2022-10-23 NOTE — Telephone Encounter (Signed)
Called patient to perform AWV. Patient stated that she has a bad headache that started yesterday. Patient stated that she went to a park and walked a trail and thinks that she may have gotten a little dehydrated. Patient stated that she  had some chills last night and a has felt a little stuffy today.. Patient stated that she just has not felt good today but better than yesterday. Patient did a Covid test while on the phone which was negative. Patient was advised to rest and drink lots of fluids to hydrate herself. Patient stated that she does feel better today than yesterday. Patient stated that she does not feel that she needs an appointment at this time. Patient stated that she will call first thing in the morning for an appointment if she does not continue to improve. Patient stated that she just thinks that she wore herself out yesterday walking that trail.

## 2022-10-23 NOTE — Telephone Encounter (Signed)
FYI Dr Lorin Picket- called to follow up with patient and let her know that you are out of office this PM. Offered her appt in person or virtual. Patient declined and said that if she is not feeling better tomorrow morning she is going to call me and let me know and agreed to do appt.

## 2022-10-24 ENCOUNTER — Encounter: Payer: Self-pay | Admitting: Internal Medicine

## 2022-10-24 ENCOUNTER — Telehealth (INDEPENDENT_AMBULATORY_CARE_PROVIDER_SITE_OTHER): Payer: Medicare Other | Admitting: Internal Medicine

## 2022-10-24 DIAGNOSIS — R519 Headache, unspecified: Secondary | ICD-10-CM | POA: Diagnosis not present

## 2022-10-24 DIAGNOSIS — C9001 Multiple myeloma in remission: Secondary | ICD-10-CM | POA: Diagnosis not present

## 2022-10-24 NOTE — Telephone Encounter (Signed)
Patient not feeling any better. Going to retest for covid. Scheduled for virtual appt this PM

## 2022-10-24 NOTE — Progress Notes (Deleted)
Subjective:    Patient ID: April Chapman, female    DOB: 12/27/1950, 72 y.o.   MRN: [id number]  Patient here for  Chief Complaint  Patient presents with   Headache   Chills   Generalized Body Aches         HPI    Past Medical History:  Diagnosis Date   Chronic kidney disease H/O   stones   History of chicken pox    Multiple myeloma (HCC)    s/p stem cell transplant (08/2010)   Past Surgical History:  Procedure Laterality Date   APPENDECTOMY  03/2015   APPENDECTOMY     Family History  Problem Relation Age of Onset   Mental illness Mother    Hypercholesterolemia Sister    Colon cancer Neg Hx    Breast cancer Neg Hx    Social History   Socioeconomic History   Marital status: Widowed    Spouse name: Not on file   Number of children: 2   Years of education: Not on file   Highest education level: Not on file  Occupational History   Occupation: retired    Associate Professor: [employer]  Tobacco Use   Smoking status: Never   Smokeless tobacco: Never  Vaping Use   Vaping status: Never Used  Substance and Sexual Activity   Alcohol use: No   Drug use: No   Sexual activity: Never  Other Topics Concern   Not on file  Social History Narrative   Not on file   Social Determinants of Health   Financial Resource Strain: Low Risk  (10/23/2022)   Overall Financial Resource Strain (CARDIA)    Difficulty of Paying Living Expenses: Not hard at all  Food Insecurity: No Food Insecurity (10/23/2022)   Hunger Vital Sign    Worried About Running Out of Food in the Last Year: Never true    Ran Out of Food in the Last Year: Never true  Transportation Needs: No Transportation Needs (10/23/2022)   PRAPARE - Administrator, Civil Service (Medical): No    Lack of Transportation (Non-Medical): No  Physical Activity: Insufficiently Active (10/23/2022)   Exercise Vital Sign    Days of Exercise per Week: 4 days    Minutes of Exercise per Session: 20 min  Stress: No Stress  Concern Present (10/23/2022)   [employer] of Occupational Health - Occupational Stress Questionnaire    Feeling of Stress : Not at all  Social Connections: Moderately Isolated (10/23/2022)   Social Connection and Isolation Panel [NHANES]    Frequency of Communication with Friends and Family: More than three times a week    Frequency of Social Gatherings with Friends and Family: More than three times a week    Attends Religious Services: More than 4 times per year    Active Member of [employer] or Organizations: No    Attends Banker Meetings: Never    Marital Status: Widowed     Review of Systems     Objective:     There were no vitals taken for this visit. Wt Readings from Last 3 Encounters:  10/23/22 177 lb (80.3 kg)  07/18/22 173 lb 9.6 oz (78.7 kg)  04/12/22 177 lb (80.3 kg)    Physical Exam   Outpatient Encounter Medications as of 10/24/2022  Medication Sig   Evolocumab (REPATHA SURECLICK) 140 MG/ML SOAJ Inject 140 mg into the skin every 14 (fourteen) days.   No facility-administered encounter medications on file as  of 10/24/2022.     Lab Results  Component Value Date   WBC 8.1 06/10/2021   HGB 10.5 (L) 06/10/2021   HCT 31.9 (L) 06/10/2021   PLT 145 (L) 06/10/2021   GLUCOSE 77 07/09/2022   CHOL 154 07/09/2022   TRIG 103.0 07/09/2022   HDL 49.50 07/09/2022   LDLCALC 84 07/09/2022   ALT 10 07/09/2022   AST 17 07/09/2022   NA 140 07/09/2022   K 3.7 07/09/2022   CL 105 07/09/2022   CREATININE 0.86 07/09/2022   BUN 19 07/09/2022   CO2 27 07/09/2022   TSH 2.11 02/21/2022   INR 1.0 04/01/2014    No results found.     Assessment & Plan:  There are no diagnoses linked to this encounter.   Dale Hartford, MD

## 2022-10-27 ENCOUNTER — Encounter: Payer: Self-pay | Admitting: Internal Medicine

## 2022-10-27 DIAGNOSIS — R519 Headache, unspecified: Secondary | ICD-10-CM | POA: Insufficient documentation

## 2022-10-27 NOTE — Progress Notes (Signed)
Patient ID: April Chapman, female   DOB: 1950/05/12, 72 y.o.   MRN: [id number]   Virtual Visit via video Note  I connected with April Chapman by a video enabled telemedicine application and verified that I am speaking with the correct person using two identifiers. Location patient: home Location provider: work  Persons participating in the virtual visit: patient, provider  The limitations, risks, security and privacy concerns of performing an evaluation and management service by video and the availability of in person appointments have been discussed.  It has also been discussed with the patient that there may be a patient responsible charge related to this service. The patient expressed understanding and agreed to proceed.   Reason for visit: work in appt  HPI: Work in with concerns regarding headache and body aches.  States she went to the park and was hiking - 10/22/22.  Went out to eat after and did not feel well - while eating.  The following night - night sweats.  Also felt cold.  Developed headache.  Stated "bones hurt".  Felt the same yesterday.  Today - headache not as bad.  Still with some body aches.  Still with cold chills.  No chest congestion or cough.  No nausea or vomiting.  No increased sinus pressure, but feels nose is clogged - not able to breathe through her nose.  Minimal sore throat.  Took two ibuprofen yesterday.  No tick bite.  No rash.  Covid test negative.     ROS: See pertinent positives and negatives per HPI.  Past Medical History:  Diagnosis Date   Chronic kidney disease H/O   stones   History of chicken pox    Multiple myeloma (HCC)    s/p stem cell transplant (08/2010)    Past Surgical History:  Procedure Laterality Date   APPENDECTOMY  03/2015   APPENDECTOMY      Family History  Problem Relation Age of Onset   Mental illness Mother    Hypercholesterolemia Sister    Colon cancer Neg Hx    Breast cancer Neg Hx     SOCIAL HX: reviewed.     Current Outpatient Medications:    Evolocumab (REPATHA SURECLICK) 140 MG/ML SOAJ, Inject 140 mg into the skin every 14 (fourteen) days., Disp: 2 mL, Rfl: 6  EXAM:  GENERAL: alert, oriented, appears well and in no acute distress  HEENT: atraumatic, conjunttiva clear, no obvious abnormalities on inspection of external nose and ears  NECK: normal movements of the head and neck  LUNGS: on inspection no signs of respiratory distress, breathing rate appears normal, no obvious gross SOB, gasping or wheezing  CV: no obvious cyanosis  PSYCH/NEURO: pleasant and cooperative, no obvious depression or anxiety, speech and thought processing grossly intact  ASSESSMENT AND PLAN:  Discussed the following assessment and plan:  Problem List Items Addressed This Visit     Multiple myeloma (HCC)    Followed by Dr Leotis Pain - discussed.        Headache - Primary    Headache, chills and body aches as outlined.  Discussed possible etiologies.  Covid test negative.  No tick bite.  No rash.  Feeling some better today.  No bowel change.  Question of acute viral syndrome.  Treat nasal congestion - saline nasal spray and steroid nasal spray.  Follow symptoms. Also discussed possibly related to increased exercise, in the heat, etc.  Rest. Fluids.  Given feeling better, will hold on further testing.  Call with update  tomorrow.  If persistent aching, will get her in for labs, including cbc and ck.  Also discussed doxycycline treatment.  Follow closely.  Call with update.        Return if symptoms worsen or fail to improve.   I discussed the assessment and treatment plan with the patient. The patient was provided an opportunity to ask questions and all were answered. The patient agreed with the plan and demonstrated an understanding of the instructions.   The patient was advised to call back or seek an in-person evaluation if the symptoms worsen or if the condition fails to improve as  anticipated.    Dale Corbin, MD

## 2022-10-27 NOTE — Assessment & Plan Note (Signed)
Followed by Dr Leotis Pain - discussed.

## 2022-10-27 NOTE — Assessment & Plan Note (Signed)
Headache, chills and body aches as outlined.  Discussed possible etiologies.  Covid test negative.  No tick bite.  No rash.  Feeling some better today.  No bowel change.  Question of acute viral syndrome.  Treat nasal congestion - saline nasal spray and steroid nasal spray.  Follow symptoms. Also discussed possibly related to increased exercise, in the heat, etc.  Rest. Fluids.  Given feeling better, will hold on further testing.  Call with update tomorrow.  If persistent aching, will get her in for labs, including cbc and ck.  Also discussed doxycycline treatment.  Follow closely.  Call with update.

## 2022-10-31 ENCOUNTER — Encounter (INDEPENDENT_AMBULATORY_CARE_PROVIDER_SITE_OTHER): Payer: Self-pay

## 2022-11-15 DIAGNOSIS — C9000 Multiple myeloma not having achieved remission: Secondary | ICD-10-CM | POA: Diagnosis not present

## 2022-11-15 IMAGING — CR DG CHEST 2V
2 series · 2 of 2 positions shown · non-contrast
Comparison: 03/10/2020

CLINICAL DATA: Cough, fever

EXAM:
CHEST - 2 VIEW

[chest pa]
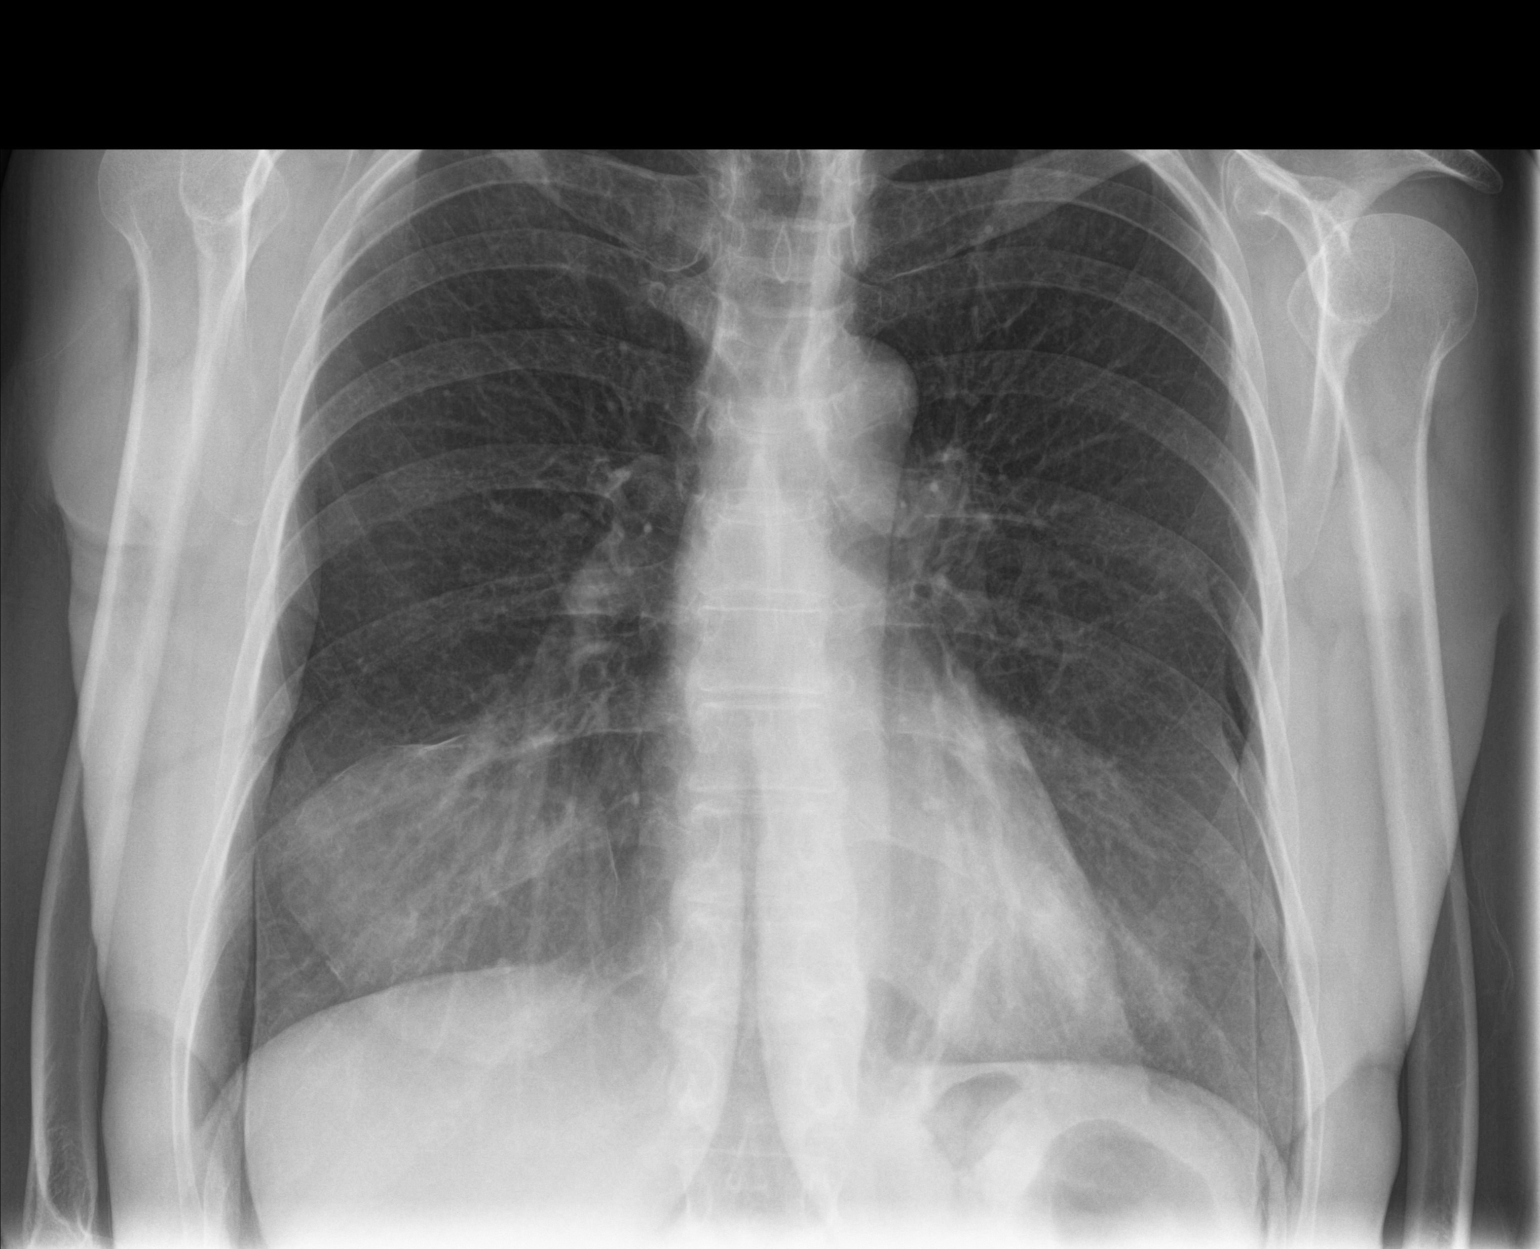

[chest lat]
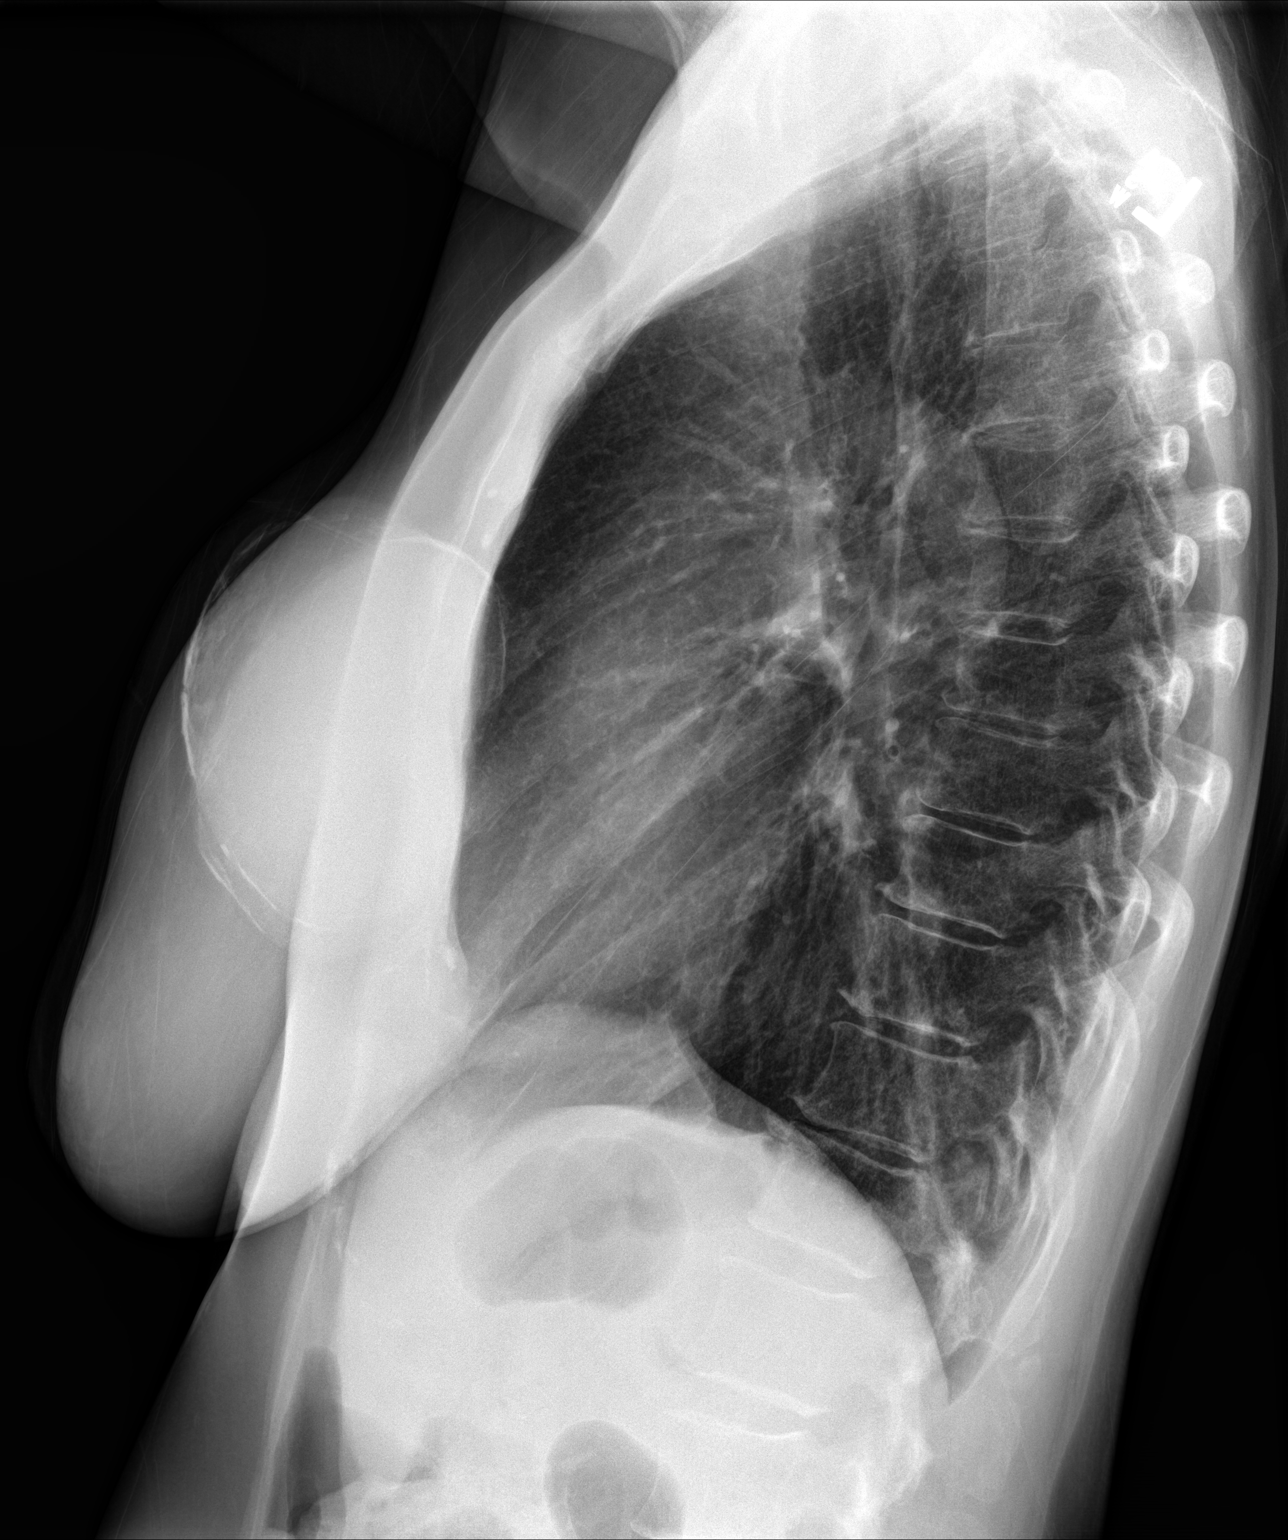

[2 of 2 positions shown; findings below may reference images not displayed]

FINDINGS: Heart and mediastinal contours are within normal limits. No focal
opacities or effusions. No acute bony abnormality. Mild
hyperinflation.
IMPRESSION: No active cardiopulmonary disease.

## 2022-11-19 ENCOUNTER — Ambulatory Visit: Payer: BLUE CROSS/BLUE SHIELD | Admitting: Internal Medicine

## 2023-01-06 DIAGNOSIS — Z9481 Bone marrow transplant status: Secondary | ICD-10-CM | POA: Diagnosis not present

## 2023-01-06 DIAGNOSIS — C9001 Multiple myeloma in remission: Secondary | ICD-10-CM | POA: Diagnosis not present

## 2023-01-06 DIAGNOSIS — Z888 Allergy status to other drugs, medicaments and biological substances status: Secondary | ICD-10-CM | POA: Diagnosis not present

## 2023-01-06 DIAGNOSIS — Z79899 Other long term (current) drug therapy: Secondary | ICD-10-CM | POA: Diagnosis not present

## 2023-01-06 DIAGNOSIS — Z08 Encounter for follow-up examination after completed treatment for malignant neoplasm: Secondary | ICD-10-CM | POA: Diagnosis not present

## 2023-01-06 DIAGNOSIS — C9000 Multiple myeloma not having achieved remission: Secondary | ICD-10-CM | POA: Diagnosis not present

## 2023-01-28 DIAGNOSIS — H26491 Other secondary cataract, right eye: Secondary | ICD-10-CM | POA: Diagnosis not present

## 2023-01-28 DIAGNOSIS — Z961 Presence of intraocular lens: Secondary | ICD-10-CM | POA: Diagnosis not present

## 2023-02-05 DIAGNOSIS — H26491 Other secondary cataract, right eye: Secondary | ICD-10-CM | POA: Diagnosis not present

## 2023-02-18 DIAGNOSIS — C9000 Multiple myeloma not having achieved remission: Secondary | ICD-10-CM | POA: Diagnosis not present

## 2023-04-09 DIAGNOSIS — C9000 Multiple myeloma not having achieved remission: Secondary | ICD-10-CM | POA: Diagnosis not present

## 2023-04-14 DIAGNOSIS — C9000 Multiple myeloma not having achieved remission: Secondary | ICD-10-CM | POA: Diagnosis not present

## 2023-04-14 DIAGNOSIS — Z9481 Bone marrow transplant status: Secondary | ICD-10-CM | POA: Diagnosis not present

## 2023-05-28 DIAGNOSIS — C9000 Multiple myeloma not having achieved remission: Secondary | ICD-10-CM | POA: Diagnosis not present

## 2023-06-16 DIAGNOSIS — L570 Actinic keratosis: Secondary | ICD-10-CM | POA: Diagnosis not present

## 2023-06-16 DIAGNOSIS — L820 Inflamed seborrheic keratosis: Secondary | ICD-10-CM | POA: Diagnosis not present

## 2023-06-16 DIAGNOSIS — D485 Neoplasm of uncertain behavior of skin: Secondary | ICD-10-CM | POA: Diagnosis not present

## 2023-07-01 DIAGNOSIS — C9001 Multiple myeloma in remission: Secondary | ICD-10-CM | POA: Diagnosis not present

## 2023-07-01 DIAGNOSIS — C9000 Multiple myeloma not having achieved remission: Secondary | ICD-10-CM | POA: Diagnosis not present

## 2023-09-03 DIAGNOSIS — D1721 Benign lipomatous neoplasm of skin and subcutaneous tissue of right arm: Secondary | ICD-10-CM | POA: Diagnosis not present

## 2023-09-03 DIAGNOSIS — Z859 Personal history of malignant neoplasm, unspecified: Secondary | ICD-10-CM | POA: Diagnosis not present

## 2023-09-03 DIAGNOSIS — L570 Actinic keratosis: Secondary | ICD-10-CM | POA: Diagnosis not present

## 2023-09-06 IMAGING — DX DG CHEST 2V
2 series · 2 of 2 positions shown · non-contrast
Comparison: 06/07/2021

CLINICAL DATA: Recent pneumonia

EXAM:
CHEST - 2 VIEW

[chest pa]
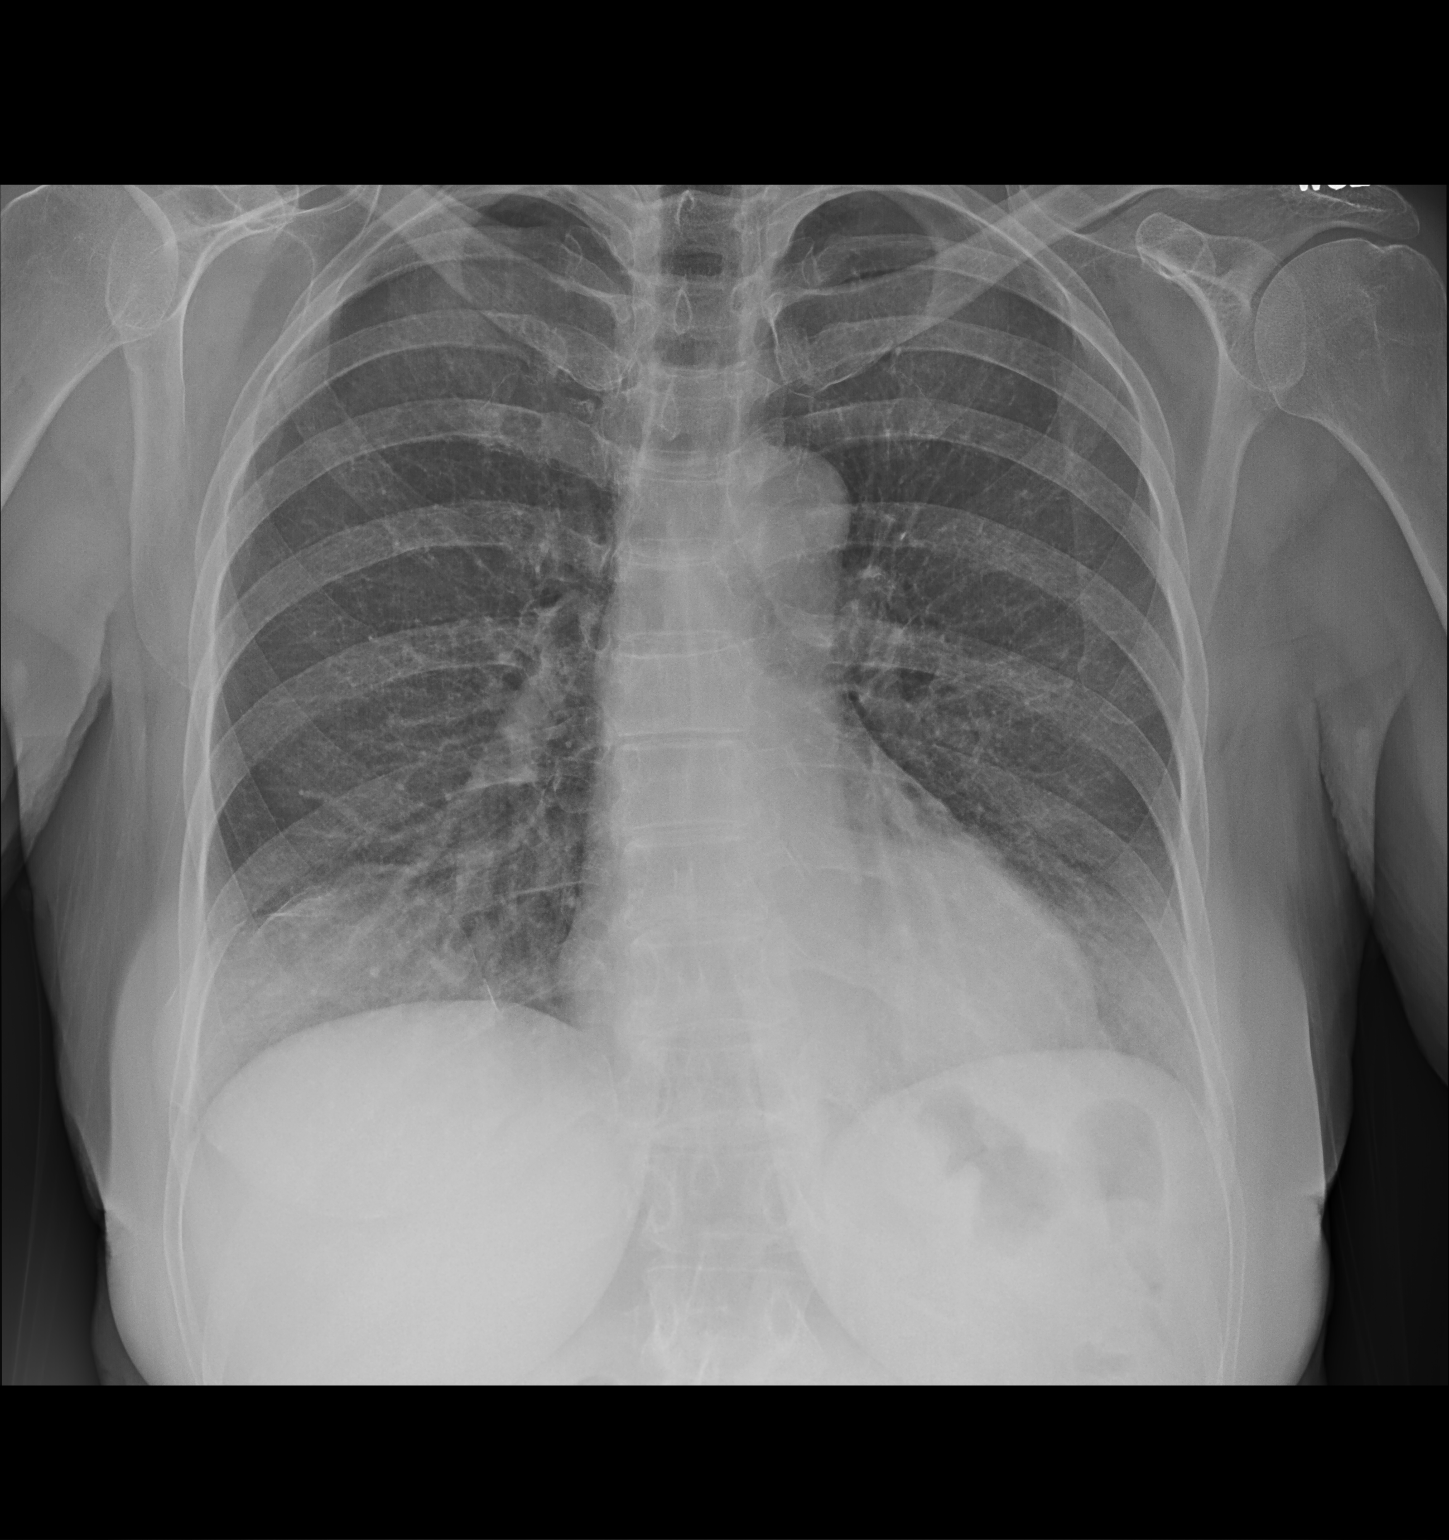

[chest lat]
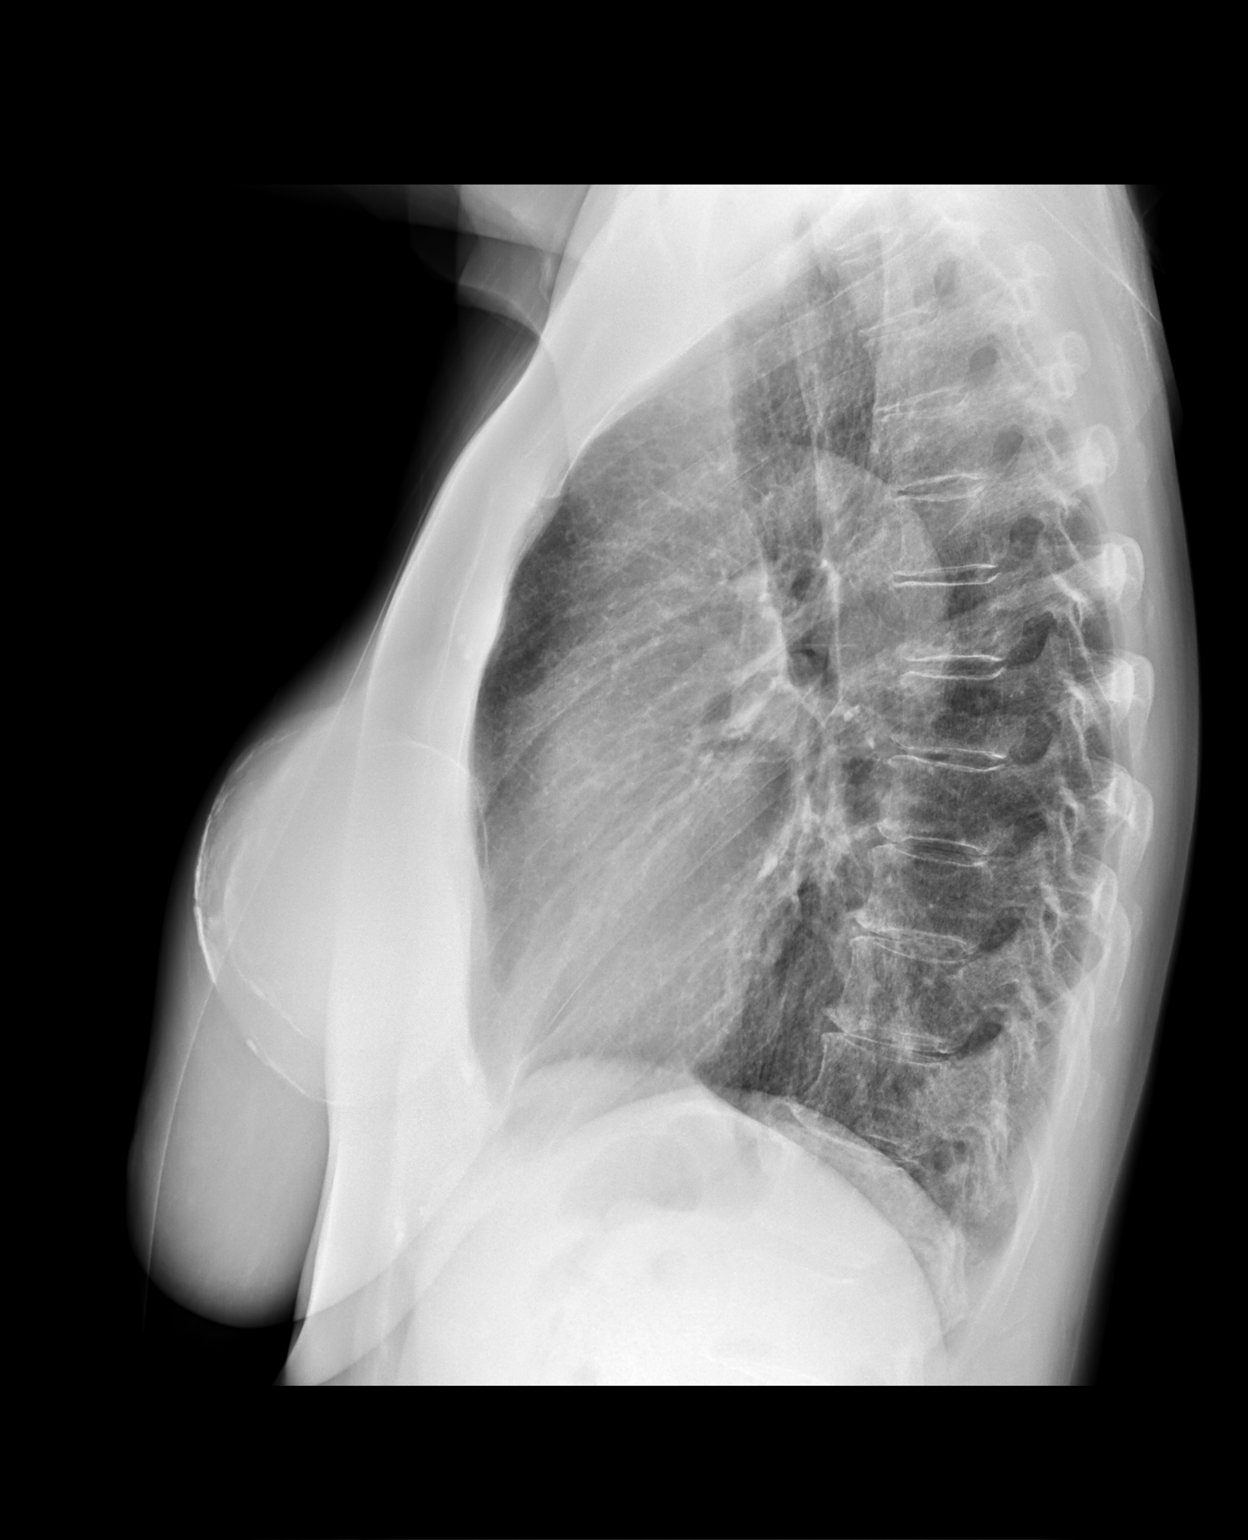

[2 of 2 positions shown; findings below may reference images not displayed]

FINDINGS: The heart size and mediastinal contours are within normal limits.
Both lungs are clear. The visualized skeletal structures are
unremarkable. Bilateral breast implants.
IMPRESSION: No acute abnormality of the lungs. Interval resolution of previously
seen retrocardiac airspace opacity and consolidation.

## 2023-10-07 DIAGNOSIS — T451X5A Adverse effect of antineoplastic and immunosuppressive drugs, initial encounter: Secondary | ICD-10-CM | POA: Diagnosis not present

## 2023-10-07 DIAGNOSIS — M542 Cervicalgia: Secondary | ICD-10-CM | POA: Diagnosis not present

## 2023-10-07 DIAGNOSIS — G629 Polyneuropathy, unspecified: Secondary | ICD-10-CM | POA: Diagnosis not present

## 2023-10-07 DIAGNOSIS — C9001 Multiple myeloma in remission: Secondary | ICD-10-CM | POA: Diagnosis not present

## 2023-10-27 ENCOUNTER — Ambulatory Visit (INDEPENDENT_AMBULATORY_CARE_PROVIDER_SITE_OTHER): Payer: BLUE CROSS/BLUE SHIELD | Admitting: *Deleted

## 2023-10-27 ENCOUNTER — Telehealth: Payer: Self-pay | Admitting: *Deleted

## 2023-10-27 VITALS — Ht 67.0 in | Wt 177.0 lb

## 2023-10-27 DIAGNOSIS — Z0000 Encounter for general adult medical examination without abnormal findings: Secondary | ICD-10-CM

## 2023-10-27 NOTE — Progress Notes (Signed)
 Subjective:   April Chapman is a 73 y.o. who presents for a Medicare Wellness preventive visit.  As a reminder, Annual Wellness Visits don't include a physical exam, and some assessments may be limited, especially if this visit is performed virtually. We may recommend an in-person follow-up visit with your provider if needed.  Visit Complete: Virtual I connected with  Deniz Eskridge Piscitello on 10/27/23 by a audio enabled telemedicine application and verified that I am speaking with the correct person using two identifiers.  Patient Location: Home  Provider Location: Home Office  I discussed the limitations of evaluation and management by telemedicine. The patient expressed understanding and agreed to proceed.  Vital Signs: Because this visit was a virtual/telehealth visit, some criteria may be missing or patient reported. Any vitals not documented were not able to be obtained and vitals that have been documented are patient reported.  VideoDeclined- This patient declined Librarian, academic. Therefore the visit was completed with audio only.  Persons Participating in Visit: Patient.  AWV Questionnaire: No: Patient Medicare AWV questionnaire was not completed prior to this visit.  Cardiac Risk Factors include: advanced age (>46men, >58 women);dyslipidemia     Objective:    Today's Vitals   10/27/23 1454  Weight: 177 lb (80.3 kg)  Height: 5' 7 (1.702 m)   Body mass index is 27.72 kg/m.     10/27/2023    3:11 PM 10/23/2022    2:29 PM 09/27/2021   10:41 AM 09/14/2021    8:36 AM 06/07/2021   10:10 PM 06/07/2021    5:39 PM 04/24/2021    9:54 PM  Advanced Directives  Does Patient Have a Medical Advance Directive? Yes Yes Yes No Yes Yes No  Type of Estate agent of [place];Living will Healthcare Power of [place];Living will Healthcare Power of [place];Living will  Living will Living will   Does patient want to make changes to medical  advance directive?   No - Patient declined  No - Patient declined No - Patient declined   Copy of Healthcare Power of Attorney in Chart? No - copy requested No - copy requested No - copy requested      Would patient like information on creating a medical advance directive?     No - Patient declined No - Patient declined     Current Medications (verified) Outpatient Encounter Medications as of 10/27/2023  Medication Sig   valACYclovir  (VALTREX ) 500 MG tablet Take 500 mg by mouth 2 (two) times daily as needed.   Evolocumab  (REPATHA  SURECLICK) 140 MG/ML SOAJ Inject 140 mg into the skin every 14 (fourteen) days. (Patient not taking: Reported on 10/27/2023)   No facility-administered encounter medications on file as of 10/27/2023.    Allergies (verified) Revlimid [lenalidomide] and Pomalidomide   History: Past Medical History:  Diagnosis Date   Chronic kidney disease H/O   stones   History of chicken pox    Multiple myeloma (HCC)    s/p stem cell transplant (08/2010)   Past Surgical History:  Procedure Laterality Date   APPENDECTOMY  03/2015   APPENDECTOMY     Family History  Problem Relation Age of Onset   Mental illness Mother    Hypercholesterolemia Sister    Colon cancer Neg Hx    Breast cancer Neg Hx    Social History   Socioeconomic History   Marital status: Widowed    Spouse name: Not on file   Number of children: 2   Years  of education: Not on file   Highest education level: Not on file  Occupational History   Occupation: retired    Associate Professor: [employer]  Tobacco Use   Smoking status: Never   Smokeless tobacco: Never  Vaping Use   Vaping status: Never Used  Substance and Sexual Activity   Alcohol use: No   Drug use: No   Sexual activity: Never  Other Topics Concern   Not on file  Social History Narrative   Not on file   Social Drivers of Health   Financial Resource Strain: Low Risk  (10/27/2023)   Overall Financial Resource Strain (CARDIA)     Difficulty of Paying Living Expenses: Not hard at all  Food Insecurity: No Food Insecurity (10/27/2023)   Hunger Vital Sign    Worried About Running Out of Food in the Last Year: Never true    Ran Out of Food in the Last Year: Never true  Transportation Needs: No Transportation Needs (10/27/2023)   PRAPARE - Administrator, Civil Service (Medical): No    Lack of Transportation (Non-Medical): No  Physical Activity: Inactive (10/27/2023)   Exercise Vital Sign    Days of Exercise per Week: 0 days    Minutes of Exercise per Session: 0 min  Stress: No Stress Concern Present (10/27/2023)   [employer] of Occupational Health - Occupational Stress Questionnaire    Feeling of Stress: Not at all  Social Connections: Moderately Isolated (10/27/2023)   Social Connection and Isolation Panel    Frequency of Communication with Friends and Family: More than three times a week    Frequency of Social Gatherings with Friends and Family: More than three times a week    Attends Religious Services: More than 4 times per year    Active Member of [employer] or Organizations: No    Attends Banker Meetings: Never    Marital Status: Widowed    Tobacco Counseling Counseling given: Not Answered    Clinical Intake:  Pre-visit preparation completed: Yes  Pain : No/denies pain     BMI - recorded: 27.72 Nutritional Status: BMI 25 -29 Overweight Nutritional Risks: None Diabetes: No  No results found for: HGBA1C   How often do you need to have someone help you when you read instructions, pamphlets, or other written materials from your doctor or pharmacy?: 1 - Never  Interpreter Needed?: No  Information entered by :: R. Tyniya Kuyper LPN   Activities of Daily Living     10/27/2023    2:56 PM  In your present state of health, do you have any difficulty performing the following activities:  Hearing? 0  Vision? 0  Difficulty concentrating or making decisions? 0  Walking or climbing  stairs? 0  Dressing or bathing? 0  Doing errands, shopping? 0  Preparing Food and eating ? N  Using the Toilet? N  In the past six months, have you accidently leaked urine? N  Do you have problems with loss of bowel control? N  Managing your Medications? N  Managing your Finances? N  Housekeeping or managing your Housekeeping? N    Patient Care Team: Glendia Shad, MD as PCP - General (Internal Medicine)  I have updated your Care Teams any recent Medical Services you may have received from other providers in the past year.     Assessment:   This is a routine wellness examination for Valarie.  Hearing/Vision screen Hearing Screening - Comments:: No issues Vision Screening - Comments:: No correction  Goals Addressed             This Visit's Progress    Patient Stated       Wants to drink more water       Depression Screen     10/27/2023    3:05 PM 10/23/2022    2:24 PM 07/18/2022   11:30 AM 10/25/2021    2:09 PM 09/27/2021   10:39 AM 09/26/2020   10:47 AM 09/24/2019   10:47 AM  PHQ 2/9 Scores  PHQ - 2 Score 3 0 0 0 0 0 0  PHQ- 9 Score 6 3         Fall Risk     10/27/2023    2:59 PM 10/23/2022    2:20 PM 07/18/2022   11:30 AM 10/25/2021    2:09 PM 09/27/2021   10:41 AM  Fall Risk   Falls in the past year? 0 0 0 0 0  Number falls in past yr: 0 0 0 0   Injury with Fall? 0 0 0 0   Risk for fall due to : No Fall Risks No Fall Risks No Fall Risks No Fall Risks   Follow up Falls evaluation completed;Falls prevention discussed Falls prevention discussed;Falls evaluation completed Falls evaluation completed Falls evaluation completed  Falls evaluation completed      Data saved with a previous flowsheet row definition    MEDICARE RISK AT HOME:  Medicare Risk at Home Any stairs in or around the home?: Yes If so, are there any without handrails?: No Home free of loose throw rugs in walkways, pet beds, electrical cords, etc?: Yes Adequate lighting in your home to reduce  risk of falls?: Yes Life alert?: No Use of a cane, walker or w/c?: No Grab bars in the bathroom?: No Shower chair or bench in shower?: No Elevated toilet seat or a handicapped toilet?: No  TIMED UP AND GO:  Was the test performed?  No  Cognitive Function: 6CIT completed    09/24/2019   11:13 AM  MMSE - Mini Mental State Exam  Not completed: Unable to complete        10/27/2023    3:12 PM 10/23/2022    2:30 PM 09/23/2018   10:43 AM  6CIT Screen  What Year? 0 points  0 points  What month? 0 points  0 points  What time? 0 points 0 points 0 points  Count back from 20 0 points 0 points 0 points  Months in reverse 0 points 0 points 0 points  Repeat phrase 0 points 0 points 0 points  Total Score 0 points  0 points    Immunizations Immunization History  Administered Date(s) Administered   DTaP 05/29/2011, 08/15/2011, 12/06/2011, 02/20/2012   DTaP / Hep B / IPV 05/15/2017, 11/06/2017, 10/01/2018   HIB (PRP-T) 05/15/2017, 11/06/2017, 10/01/2018   Hepatitis B, ADULT 05/29/2011, 08/15/2011, 12/06/2011   IPV 05/29/2011, 08/15/2011, 12/06/2011   Influenza,inj,Quad PF,6+ Mos 03/23/2015   MMR 03/23/2015, 04/08/2019   Pneumococcal Conjugate-13 05/29/2011, 08/15/2011, 12/06/2011, 02/20/2012, 05/15/2017, 11/06/2017, 10/01/2018   Pneumococcal Polysaccharide-23 03/23/2015, 04/08/2019   Tdap 10/31/2019   Zoster Recombinant(Shingrix) 05/15/2017, 11/06/2017    Screening Tests Health Maintenance  Topic Date Due   MAMMOGRAM  04/07/2021   Medicare Annual Wellness (AWV)  10/23/2023   INFLUENZA VACCINE  10/17/2023   Fecal DNA (Cologuard)  12/21/2023   DTaP/Tdap/Td (9 - Td or Tdap) 10/30/2029   Pneumococcal Vaccine: 50+ Years  Completed   Hepatitis B Vaccines  Completed  DEXA SCAN  Completed   Hepatitis C Screening  Completed   Zoster Vaccines- Shingrix  Completed   HPV VACCINES  Aged Out   Meningococcal B Vaccine  Aged Out   COVID-19 Vaccine  Discontinued    Health  Maintenance  Health Maintenance Due  Topic Date Due   MAMMOGRAM  04/07/2021   Medicare Annual Wellness (AWV)  10/23/2023   INFLUENZA VACCINE  10/17/2023   Health Maintenance Items Addressed: Discussed the need to update flu annually. Patient declines mammogram at this time. Patient stated that she recently had a total PET scan and does not feel that a Dexa is necessary but will talk with PCP at upcoming appointment  Additional Screening:  Vision Screening: Recommended annual ophthalmology exams for early detection of glaucoma and other disorders of the eye.Up to date  [facility] Would you like a referral to an eye doctor? No    Dental Screening: Recommended annual dental exams for proper oral hygiene  Community Resource Referral / Chronic Care Management: CRR required this visit?  No   CCM required this visit?  No   Plan:    I have personally reviewed and noted the following in the patient's chart:   Medical and social history Use of alcohol, tobacco or illicit drugs  Current medications and supplements including opioid prescriptions. Patient is not currently taking opioid prescriptions. Functional ability and status Nutritional status Physical activity Advanced directives List of other physicians Hospitalizations, surgeries, and ER visits in previous 12 months Vitals Screenings to include cognitive, depression, and falls Referrals and appointments  In addition, I have reviewed and discussed with patient certain preventive protocols, quality metrics, and best practice recommendations. A written personalized care plan for preventive services as well as general preventive health recommendations were provided to patient.   Angeline Fredericks, LPN   1/88/7974   After Visit Summary: (MyChart) Due to this being a telephonic visit, the after visit summary with patients personalized plan was offered to patient via MyChart   Notes: Nothing significant to report at this time. Phone  note sent to PCP

## 2023-10-27 NOTE — Telephone Encounter (Signed)
 Performed AWV While reviewing patient's medication she stated that she no longer takes Repatha . Patient stated when it was prescribed she only took it for a short while. Patient stated that she may have taken the shot 3 times then stopped it on her own.  Patient also declines a dexa scam because her cancer doctor did a total PET scan on her recently and she does not think that a Dexa is necessary.  Patient has an upcoming appointment scheduled with Dr. Glendia 12/01/23.

## 2023-10-27 NOTE — Patient Instructions (Signed)
 April Chapman , Thank you for taking time out of your busy schedule to complete your Annual Wellness Visit with me. I enjoyed our conversation and look forward to speaking with you again next year. I, as well as your care team,  appreciate your ongoing commitment to your health goals. Please review the following plan we discussed and let me know if I can assist you in the future. Your Game plan/ To Do List    Referrals: If you haven't heard from the office you've been referred to, please reach out to them at the phone provided.  Remember to update your flu shot annually.  Consider getting a mammogram.  Follow up Visits: We will see or speak with you next year for your Next Medicare AWV with our clinical staff 11/01/24 @ 11:30 Have you seen your provider in the last 6 months (3 months if uncontrolled diabetes)? No  Remember your upcoming appointment with your PCP  Clinician Recommendations:  Aim for 30 minutes of exercise or brisk walking, 6-8 glasses of water, and 5 servings of fruits and vegetables each day.       This is a list of the screenings recommended for you:  Health Maintenance  Topic Date Due   Mammogram  04/07/2021   Flu Shot  10/17/2023   Cologuard (Stool DNA test)  12/21/2023   Medicare Annual Wellness Visit  10/26/2024   DTaP/Tdap/Td vaccine (9 - Td or Tdap) 10/30/2029   Pneumococcal Vaccine for age over 42  Completed   Hepatitis B Vaccine  Completed   DEXA scan (bone density measurement)  Completed   Hepatitis C Screening  Completed   Zoster (Shingles) Vaccine  Completed   HPV Vaccine  Aged Out   Meningitis B Vaccine  Aged Out   COVID-19 Vaccine  Discontinued    Advanced directives: (Copy Requested) Please bring a copy of your health care power of attorney and living will to the office to be added to your chart at your convenience. You can mail to [facility] [address] [place], [place] [address] or email to [email or web address] Advance Care  Planning is important because it:  [x]  Makes sure you receive the medical care that is consistent with your values, goals, and preferences  [x]  It provides guidance to your family and loved ones and reduces their decisional burden about whether or not they are making the right decisions based on your wishes.

## 2023-12-01 ENCOUNTER — Ambulatory Visit (INDEPENDENT_AMBULATORY_CARE_PROVIDER_SITE_OTHER): Admitting: Internal Medicine

## 2023-12-01 VITALS — BP 120/68 | HR 77 | Resp 16 | Ht 67.5 in | Wt 183.0 lb

## 2023-12-01 DIAGNOSIS — C9001 Multiple myeloma in remission: Secondary | ICD-10-CM

## 2023-12-01 DIAGNOSIS — E041 Nontoxic single thyroid nodule: Secondary | ICD-10-CM | POA: Diagnosis not present

## 2023-12-01 DIAGNOSIS — Z1231 Encounter for screening mammogram for malignant neoplasm of breast: Secondary | ICD-10-CM

## 2023-12-01 DIAGNOSIS — D649 Anemia, unspecified: Secondary | ICD-10-CM | POA: Diagnosis not present

## 2023-12-01 DIAGNOSIS — Z136 Encounter for screening for cardiovascular disorders: Secondary | ICD-10-CM

## 2023-12-01 DIAGNOSIS — E7800 Pure hypercholesterolemia, unspecified: Secondary | ICD-10-CM | POA: Diagnosis not present

## 2023-12-01 DIAGNOSIS — Z1211 Encounter for screening for malignant neoplasm of colon: Secondary | ICD-10-CM | POA: Diagnosis not present

## 2023-12-01 NOTE — Assessment & Plan Note (Addendum)
 Saw Dr Solum 02/19/22 - f/u thyroid  nodule - stable.  Recommended f/u as needed. Discussed exam - right thyroid  fullness. Will refer back to endocrinology - reevaluation. Discussed possible f/u ultrasound.

## 2023-12-01 NOTE — Progress Notes (Signed)
 Subjective:    Patient ID: April Chapman, female    DOB: 04/27/1950, 73 y.o.   MRN: [id number]  Patient here for  Chief Complaint  Patient presents with   Medical Management of Chronic Issues    HPI Here for a scheduled follow up - follow up regarding hypercholesterolemia. Last seen in the office 07/21/22. Sees Dr Odella for f/u multiple myeloma. Last seen 10/07/23. Stable. Continue surveillance. Cologuard 12/2020. Overdue mammogram. Tries to stay active. No chest pain or sob reported. No abdominal pain or bowel change reported. Off repatha . Desires not to take. Discussed calcium  score. Agreeable.    Past Medical History:  Diagnosis Date   Chronic kidney disease H/O   stones   History of chicken pox    Multiple myeloma (HCC)    s/p stem cell transplant (08/2010)   Past Surgical History:  Procedure Laterality Date   APPENDECTOMY  03/2015   APPENDECTOMY     Family History  Problem Relation Age of Onset   Mental illness Mother    Hypercholesterolemia Sister    Colon cancer Neg Hx    Breast cancer Neg Hx    Social History   Socioeconomic History   Marital status: Widowed    Spouse name: Not on file   Number of children: 2   Years of education: Not on file   Highest education level: Not on file  Occupational History   Occupation: retired    Associate Professor: [employer]  Tobacco Use   Smoking status: Never   Smokeless tobacco: Never  Vaping Use   Vaping status: Never Used  Substance and Sexual Activity   Alcohol use: No   Drug use: No   Sexual activity: Never  Other Topics Concern   Not on file  Social History Narrative   Not on file   Social Drivers of Health   Financial Resource Strain: Low Risk  (10/27/2023)   Overall Financial Resource Strain (CARDIA)    Difficulty of Paying Living Expenses: Not hard at all  Food Insecurity: No Food Insecurity (10/27/2023)   Hunger Vital Sign    Worried About Running Out of Food in the Last Year: Never true    Ran Out of  Food in the Last Year: Never true  Transportation Needs: No Transportation Needs (10/27/2023)   PRAPARE - Administrator, Civil Service (Medical): No    Lack of Transportation (Non-Medical): No  Physical Activity: Inactive (10/27/2023)   Exercise Vital Sign    Days of Exercise per Week: 0 days    Minutes of Exercise per Session: 0 min  Stress: No Stress Concern Present (10/27/2023)   [employer] of Occupational Health - Occupational Stress Questionnaire    Feeling of Stress: Not at all  Social Connections: Moderately Isolated (10/27/2023)   Social Connection and Isolation Panel    Frequency of Communication with Friends and Family: More than three times a week    Frequency of Social Gatherings with Friends and Family: More than three times a week    Attends Religious Services: More than 4 times per year    Active Member of [employer] or Organizations: No    Attends Banker Meetings: Never    Marital Status: Widowed     Review of Systems  Constitutional:  Negative for appetite change and unexpected weight change.  HENT:  Negative for congestion and sinus pressure.   Respiratory:  Negative for cough, chest tightness and shortness of breath.   Cardiovascular:  Negative for chest pain, palpitations and leg swelling.  Gastrointestinal:  Negative for abdominal pain, diarrhea, nausea and vomiting.  Genitourinary:  Negative for difficulty urinating and dysuria.  Musculoskeletal:  Negative for joint swelling and myalgias.  Skin:  Negative for color change and rash.  Neurological:  Negative for dizziness and headaches.  Psychiatric/Behavioral:  Negative for agitation and dysphoric mood.        Objective:     BP 120/68   Pulse 77   Resp 16   Ht 5' 7.5 (1.715 m)   Wt 183 lb (83 kg)   SpO2 99%   BMI 28.24 kg/m  Wt Readings from Last 3 Encounters:  12/01/23 183 lb (83 kg)  10/27/23 177 lb (80.3 kg)  10/23/22 177 lb (80.3 kg)    Physical Exam Vitals  reviewed.  Constitutional:      General: She is not in acute distress.    Appearance: Normal appearance.  HENT:     Head: Normocephalic and atraumatic.     Right Ear: External ear normal.     Left Ear: External ear normal.     Mouth/Throat:     Pharynx: No oropharyngeal exudate or posterior oropharyngeal erythema.  Eyes:     General: No scleral icterus.       Right eye: No discharge.        Left eye: No discharge.     Conjunctiva/sclera: Conjunctivae normal.  Neck:     Thyroid : No thyromegaly.     Comments: Fullness - palpable - right thyroid .  Cardiovascular:     Rate and Rhythm: Normal rate and regular rhythm.  Pulmonary:     Effort: No respiratory distress.     Breath sounds: Normal breath sounds. No wheezing.  Abdominal:     General: Bowel sounds are normal.     Palpations: Abdomen is soft.     Tenderness: There is no abdominal tenderness.  Musculoskeletal:        General: No swelling or tenderness.     Cervical back: Neck supple. No tenderness.  Lymphadenopathy:     Cervical: No cervical adenopathy.  Skin:    Findings: No erythema or rash.  Neurological:     Mental Status: She is alert.  Psychiatric:        Mood and Affect: Mood normal.        Behavior: Behavior normal.         Outpatient Encounter Medications as of 12/01/2023  Medication Sig   valACYclovir  (VALTREX ) 500 MG tablet Take 500 mg by mouth 2 (two) times daily as needed.   [DISCONTINUED] Evolocumab  (REPATHA  SURECLICK) 140 MG/ML SOAJ Inject 140 mg into the skin every 14 (fourteen) days. (Patient not taking: Reported on 10/27/2023)   No facility-administered encounter medications on file as of 12/01/2023.     Lab Results  Component Value Date   WBC 8.1 06/10/2021   HGB 10.5 (L) 06/10/2021   HCT 31.9 (L) 06/10/2021   PLT 145 (L) 06/10/2021   GLUCOSE 92 12/01/2023   CHOL 225 (H) 12/01/2023   TRIG 146.0 12/01/2023   HDL 51.40 12/01/2023   LDLCALC 145 (H) 12/01/2023   ALT 11 12/01/2023   AST 17  12/01/2023   NA 138 12/01/2023   K 4.9 12/01/2023   CL 101 12/01/2023   CREATININE 0.95 12/01/2023   BUN 34 (H) 12/01/2023   CO2 26 12/01/2023   TSH 1.02 12/01/2023   INR 1.0 04/01/2014    No results found.  Assessment & Plan:  Hypercholesterolemia Assessment & Plan: Intolerance to crestor  and pravastatin .  Also intolerant to lovastatin  - constipation. Have discussed labs and calculated cholesterol risk previously.   Low cholesterol diet and exercise.  Started repatha . Off now. Discussed cholesterol and treatment. Desires not to start medication. Discussed screening calcium  score. Agreeable.   Orders: -     Basic metabolic panel with GFR -     Hepatic function panel -     Lipid panel -     TSH  Multiple myeloma in remission [facility]) Assessment & Plan: S/p second transplant.  Followed by oncology.  Stable.    Thyroid  nodule Assessment & Plan:  Saw Dr Damian 02/19/22 - f/u thyroid  nodule - stable.  Recommended f/u as needed. Discussed exam - right thyroid  fullness. Will refer back to endocrinology - reevaluation. Discussed possible f/u ultrasound.   Orders: -     Ambulatory referral to Endocrinology  Encounter for screening mammogram for malignant neoplasm of breast Assessment & Plan: Declines mammogram.    Colon cancer screening Assessment & Plan: cologuard 12/20/20 - negative. Agreeable cologuard.   Orders: -     Cologuard  Anemia, unspecified type Assessment & Plan: Followed by hematology.    Encounter for screening for coronary artery disease -     CT CARDIAC SCORING (SELF PAY ONLY); Future     Allena Hamilton, MD

## 2023-12-02 LAB — BASIC METABOLIC PANEL WITH GFR
BUN: 34 mg/dL — ABNORMAL HIGH (ref 6–23)
CO2: 26 meq/L (ref 19–32)
Calcium: 9.5 mg/dL (ref 8.4–10.5)
Chloride: 101 meq/L (ref 96–112)
Creatinine, Ser: 0.95 mg/dL (ref 0.40–1.20)
GFR: 59.77 mL/min — ABNORMAL LOW (ref >60.00)
Glucose, Bld: 92 mg/dL (ref 70–99)
Potassium: 4.9 meq/L (ref 3.5–5.1)
Sodium: 138 meq/L (ref 135–145)

## 2023-12-02 LAB — HEPATIC FUNCTION PANEL
ALT: 11 U/L (ref 0–35)
AST: 17 U/L (ref 0–37)
Albumin: 4.3 g/dL (ref 3.5–5.2)
Alkaline Phosphatase: 52 U/L (ref 39–117)
Bilirubin, Direct: 0.1 mg/dL (ref 0.0–0.3)
Total Bilirubin: 0.5 mg/dL (ref 0.2–1.2)
Total Protein: 7 g/dL (ref 6.0–8.3)

## 2023-12-02 LAB — TSH: TSH: 1.02 u[IU]/mL (ref 0.35–5.50)

## 2023-12-02 LAB — LIPID PANEL
Cholesterol: 225 mg/dL — ABNORMAL HIGH (ref 0–200)
HDL: 51.4 mg/dL (ref >39.00)
LDL Cholesterol: 145 mg/dL — ABNORMAL HIGH (ref 0–99)
NonHDL: 173.99
Total CHOL/HDL Ratio: 4
Triglycerides: 146 mg/dL (ref 0.0–149.0)
VLDL: 29.2 mg/dL (ref 0.0–40.0)

## 2023-12-04 ENCOUNTER — Ambulatory Visit: Payer: Self-pay | Admitting: Internal Medicine

## 2023-12-07 ENCOUNTER — Encounter: Payer: Self-pay | Admitting: Internal Medicine

## 2023-12-07 DIAGNOSIS — Z1239 Encounter for other screening for malignant neoplasm of breast: Secondary | ICD-10-CM | POA: Insufficient documentation

## 2023-12-07 NOTE — Assessment & Plan Note (Signed)
 cologuard 12/20/20 - negative. Agreeable cologuard.

## 2023-12-07 NOTE — Assessment & Plan Note (Signed)
S/p second transplant.  Followed by oncology.  Stable.

## 2023-12-07 NOTE — Assessment & Plan Note (Signed)
Declines mammogram

## 2023-12-07 NOTE — Assessment & Plan Note (Signed)
 Intolerance to crestor  and pravastatin .  Also intolerant to lovastatin  - constipation. Have discussed labs and calculated cholesterol risk previously.   Low cholesterol diet and exercise.  Started repatha . Off now. Discussed cholesterol and treatment. Desires not to start medication. Discussed screening calcium  score. Agreeable.

## 2023-12-07 NOTE — Assessment & Plan Note (Signed)
 Followed by hematology

## 2023-12-25 DIAGNOSIS — Z1211 Encounter for screening for malignant neoplasm of colon: Secondary | ICD-10-CM | POA: Diagnosis not present

## 2023-12-26 ENCOUNTER — Ambulatory Visit
Admission: RE | Admit: 2023-12-26 | Discharge: 2023-12-26 | Disposition: A | Discharge status: Home / Self Care | Payer: Self-pay | Source: Ambulatory Visit | Attending: Internal Medicine | Admitting: Internal Medicine

## 2023-12-26 DIAGNOSIS — Z136 Encounter for screening for cardiovascular disorders: Secondary | ICD-10-CM | POA: Insufficient documentation

## 2024-01-03 LAB — COLOGUARD: COLOGUARD: POSITIVE — AB

## 2024-01-07 ENCOUNTER — Telehealth: Payer: Self-pay

## 2024-01-07 DIAGNOSIS — R195 Other fecal abnormalities: Secondary | ICD-10-CM

## 2024-01-07 NOTE — Telephone Encounter (Signed)
 Copied from CRM [id number]. Topic: General - Other >> Jan 07, 2024  9:44 AM Rosina BIRCH wrote: Reason for CRM: patient called stating she want to let the provider know to go ahead and make the GI referral, but she want it made at [facility] clinic with MD Vanga [phone]

## 2024-01-07 NOTE — Telephone Encounter (Signed)
 Order placed for GI referral.

## 2024-01-07 NOTE — Addendum Note (Signed)
 Addended by: GLENDIA ALLENA RAMAN on: 01/07/2024 10:34 PM   Modules accepted: Orders

## 2024-01-12 DIAGNOSIS — G620 Drug-induced polyneuropathy: Secondary | ICD-10-CM | POA: Diagnosis not present

## 2024-01-12 DIAGNOSIS — M542 Cervicalgia: Secondary | ICD-10-CM | POA: Diagnosis not present

## 2024-01-12 DIAGNOSIS — C9001 Multiple myeloma in remission: Secondary | ICD-10-CM | POA: Diagnosis not present

## 2024-01-12 DIAGNOSIS — T451X5A Adverse effect of antineoplastic and immunosuppressive drugs, initial encounter: Secondary | ICD-10-CM | POA: Diagnosis not present

## 2024-01-26 DIAGNOSIS — R195 Other fecal abnormalities: Secondary | ICD-10-CM | POA: Diagnosis not present

## 2024-01-29 ENCOUNTER — Encounter: Payer: Self-pay | Admitting: Gastroenterology

## 2024-01-29 NOTE — Anesthesia Preprocedure Evaluation (Addendum)
 Anesthesia Evaluation  Patient identified by MRN, date of birth, ID band Patient awake    Reviewed: Allergy & Precautions, H&P , NPO status , Patient's Chart, lab work & pertinent test results  History of Anesthesia Complications (+) history of anesthetic complications  Airway Mallampati: I  TM Distance: <3 FB Neck ROM: Full    Dental no notable dental hx. (+) Upper Dentures, Lower Dentures   Pulmonary neg pulmonary ROS, pneumonia   Pulmonary exam normal breath sounds clear to auscultation       Cardiovascular negative cardio ROS Normal cardiovascular exam Rhythm:Regular Rate:Normal     Neuro/Psych  Headaches  Neuromuscular disease negative neurological ROS  negative psych ROS   GI/Hepatic negative GI ROS, Neg liver ROS,,,  Endo/Other  negative endocrine ROS    Renal/GU Renal diseasenegative Renal ROS  negative genitourinary   Musculoskeletal negative musculoskeletal ROS (+) Arthritis ,    Abdominal   Peds negative pediatric ROS (+)  Hematology negative hematology ROS (+) Blood dyscrasia, anemia   Anesthesia Other Findings Multiple myeloma, in remission after stem cell transplant 08/2010 and 08/2016 History of chicken pox Chronic kidney disease  Complication of anesthesia--hard to wake Pneumonia  History of kidney stones Arthritis  Neuromuscular disorder Multiple myeloma     Reproductive/Obstetrics negative OB ROS                              Anesthesia Physical Anesthesia Plan  ASA: 3  Anesthesia Plan: General   Post-op Pain Management:    Induction: Intravenous  PONV Risk Score and Plan:   Airway Management Planned: Natural Airway and Nasal Cannula  Additional Equipment:   Intra-op Plan:   Post-operative Plan:   Informed Consent: I have reviewed the patients History and Physical, chart, labs and discussed the procedure including the risks, benefits and  alternatives for the proposed anesthesia with the patient or authorized representative who has indicated his/her understanding and acceptance.     Dental Advisory Given  Plan Discussed with: Anesthesiologist, CRNA and Surgeon  Anesthesia Plan Comments: (Patient consented for risks of anesthesia including but not limited to:  - adverse reactions to medications - risk of airway placement if required - damage to eyes, teeth, lips or other oral mucosa - nerve damage due to positioning  - sore throat or hoarseness - Damage to heart, brain, nerves, lungs, other parts of body or loss of life  Patient voiced understanding and assent.)         Anesthesia Quick Evaluation

## 2024-02-02 ENCOUNTER — Ambulatory Visit: Payer: Self-pay | Admitting: Anesthesiology

## 2024-02-02 ENCOUNTER — Encounter: Payer: Self-pay | Admitting: Gastroenterology

## 2024-02-02 ENCOUNTER — Ambulatory Visit
Admission: RE | Admit: 2024-02-02 | Discharge: 2024-02-02 | Disposition: A | Discharge status: Home / Self Care | Attending: Gastroenterology | Admitting: Gastroenterology

## 2024-02-02 ENCOUNTER — Encounter
Admission: RE | Disposition: A | Discharge status: Home / Self Care | Payer: Self-pay | Source: Home / Self Care | Attending: Gastroenterology

## 2024-02-02 ENCOUNTER — Other Ambulatory Visit: Payer: Self-pay

## 2024-02-02 DIAGNOSIS — R195 Other fecal abnormalities: Secondary | ICD-10-CM | POA: Insufficient documentation

## 2024-02-02 DIAGNOSIS — K641 Second degree hemorrhoids: Secondary | ICD-10-CM | POA: Diagnosis not present

## 2024-02-02 DIAGNOSIS — K635 Polyp of colon: Secondary | ICD-10-CM | POA: Diagnosis not present

## 2024-02-02 DIAGNOSIS — K644 Residual hemorrhoidal skin tags: Secondary | ICD-10-CM | POA: Diagnosis not present

## 2024-02-02 DIAGNOSIS — D124 Benign neoplasm of descending colon: Secondary | ICD-10-CM | POA: Diagnosis not present

## 2024-02-02 DIAGNOSIS — R519 Headache, unspecified: Secondary | ICD-10-CM | POA: Diagnosis not present

## 2024-02-02 DIAGNOSIS — E782 Mixed hyperlipidemia: Secondary | ICD-10-CM | POA: Diagnosis not present

## 2024-02-02 DIAGNOSIS — D649 Anemia, unspecified: Secondary | ICD-10-CM | POA: Insufficient documentation

## 2024-02-02 DIAGNOSIS — Z9484 Stem cells transplant status: Secondary | ICD-10-CM | POA: Insufficient documentation

## 2024-02-02 DIAGNOSIS — Z1211 Encounter for screening for malignant neoplasm of colon: Secondary | ICD-10-CM | POA: Insufficient documentation

## 2024-02-02 DIAGNOSIS — E7800 Pure hypercholesterolemia, unspecified: Secondary | ICD-10-CM | POA: Diagnosis not present

## 2024-02-02 HISTORY — DX: Personal history of urinary calculi: Z87.442

## 2024-02-02 HISTORY — DX: Unspecified osteoarthritis, unspecified site: M19.90

## 2024-02-02 HISTORY — PX: COLONOSCOPY: SHX5424

## 2024-02-02 HISTORY — DX: Myoneural disorder, unspecified: G70.9

## 2024-02-02 HISTORY — PX: POLYPECTOMY: SHX149

## 2024-02-02 HISTORY — DX: Pneumonia, unspecified organism: J18.9

## 2024-02-02 HISTORY — DX: Other seasonal allergic rhinitis: J30.2

## 2024-02-02 HISTORY — DX: Other complications of anesthesia, initial encounter: T88.59XA

## 2024-02-02 SURGERY — COLONOSCOPY
Anesthesia: General | Site: Rectum

## 2024-02-02 MED ORDER — LACTATED RINGERS IV SOLN: INTRAVENOUS | Status: DC

## 2024-02-02 MED ORDER — SODIUM CHLORIDE 0.9 % IV SOLN: INTRAVENOUS | Status: DC

## 2024-02-02 MED ORDER — STERILE WATER FOR IRRIGATION IR SOLN
Status: DC | PRN
Start: 2024-02-02 — End: 2024-02-02
  Administered 2024-02-02: 500 mL

## 2024-02-02 MED ORDER — PROPOFOL 10 MG/ML IV BOLUS
INTRAVENOUS | Status: AC
  Filled 2024-02-02: qty 40

## 2024-02-02 MED ORDER — LIDOCAINE HCL (CARDIAC) PF 100 MG/5ML IV SOSY
PREFILLED_SYRINGE | INTRAVENOUS | Status: DC | PRN
  Administered 2024-02-02: 50 mg via INTRAVENOUS

## 2024-02-02 MED ORDER — STERILE WATER FOR IRRIGATION IR SOLN
Status: DC | PRN
  Administered 2024-02-02: 60 mL

## 2024-02-02 MED ORDER — LIDOCAINE HCL (PF) 2 % IJ SOLN
INTRAMUSCULAR | Status: AC
  Filled 2024-02-02: qty 5

## 2024-02-02 MED ORDER — PROPOFOL 10 MG/ML IV BOLUS
INTRAVENOUS | Status: DC | PRN
  Administered 2024-02-02: 50 mg via INTRAVENOUS
  Administered 2024-02-02: 80 mg via INTRAVENOUS
  Administered 2024-02-02: 40 mg via INTRAVENOUS
  Administered 2024-02-02 (×3): 50 mg via INTRAVENOUS
  Administered 2024-02-02: 80 mg via INTRAVENOUS

## 2024-02-02 SURGICAL SUPPLY — 16 items
CLIP HMST 235XBRD CATH ROT (MISCELLANEOUS) IMPLANT
ELECTRODE REM PT RTRN 9FT ADLT (ELECTROSURGICAL) IMPLANT
FORCEPS BIOP RAD 4 LRG CAP 4 (CUTTING FORCEPS) IMPLANT
FORCEPS ESCP3.2XJMB 240X2.8X (MISCELLANEOUS) IMPLANT
GAUZE SPONGE 4X4 12PLY STRL (GAUZE/BANDAGES/DRESSINGS) IMPLANT
GOWN CVR UNV OPN BCK APRN NK (MISCELLANEOUS) ×4 IMPLANT
INJECTOR VARIJECT VIN23 (MISCELLANEOUS) IMPLANT
KIT DEFENDO VALVE AND CONN (KITS) IMPLANT
KIT PROCEDURE OLYMPUS (MISCELLANEOUS) ×2 IMPLANT
MANIFOLD NEPTUNE II (INSTRUMENTS) ×2 IMPLANT
MARKER SPOT ENDO TATTOO 5ML (MISCELLANEOUS) IMPLANT
PROBE APC STR FIRE (PROBE) IMPLANT
RETRIEVER NET ROTH 2.5X230 LF (MISCELLANEOUS) IMPLANT
SNARE COLD EXACTO (MISCELLANEOUS) IMPLANT
TRAP ETRAP POLY (MISCELLANEOUS) IMPLANT
WATER STERILE IRR 250ML POUR (IV SOLUTION) ×2 IMPLANT

## 2024-02-02 NOTE — Op Note (Signed)
 [facility] Patient Name: April Chapman Procedure Date: 02/02/2024 7:16 AM MRN: [id number] Account #: [id number] Date of Birth: Jul 19, 1950 Admit Type: Outpatient Age: 73 Room: [facility] OR ROOM 01 Gender: Female Note Status: Finalized Instrument Name: Colonoscope [id number] Procedure:             Colonoscopy Indications:           This is the patient's first colonoscopy, Positive                         Cologuard test Providers:             Corinn Jess Brooklyn MD, MD Referring MD:          Allena Hamilton, MD (Referring MD) Medicines:             General Anesthesia Complications:         No immediate complications. Estimated blood loss: None. Procedure:             Pre-Anesthesia Assessment:                        - Prior to the procedure, a History and Physical was                         performed, and patient medications and allergies were                         reviewed. The patient is competent. The risks and                         benefits of the procedure and the sedation options and                         risks were discussed with the patient. All questions                         were answered and informed consent was obtained.                         Patient identification and proposed procedure were                         verified by the physician, the nurse, the                         anesthesiologist, the anesthetist and the technician                         in the pre-procedure area in the procedure room in the                         endoscopy suite. Mental Status Examination: alert and                         oriented. Airway Examination: normal oropharyngeal                         airway and neck mobility. Respiratory Examination:  clear to auscultation. CV Examination: normal.                         Prophylactic Antibiotics: The patient does not require                         prophylactic antibiotics.  Prior Anticoagulants: The                         patient has taken no anticoagulant or antiplatelet                         agents. ASA Grade Assessment: III - A patient with                         severe systemic disease. After reviewing the risks and                         benefits, the patient was deemed in satisfactory                         condition to undergo the procedure. The anesthesia                         plan was to use general anesthesia. Immediately prior                         to administration of medications, the patient was                         re-assessed for adequacy to receive sedatives. The                         heart rate, respiratory rate, oxygen saturations,                         blood pressure, adequacy of pulmonary ventilation, and                         response to care were monitored throughout the                         procedure. The physical status of the patient was                         re-assessed after the procedure.                        After obtaining informed consent, the colonoscope was                         passed under direct vision. Throughout the procedure,                         the patient's blood pressure, pulse, and oxygen                         saturations were monitored continuously. The  Colonoscope was introduced through the anus and                         advanced to the the cecum, identified by appendiceal                         orifice and ileocecal valve. The colonoscopy was                         performed with difficulty due to restricted mobility                         of the colon. Successful completion of the procedure                         was aided by applying abdominal pressure. The patient                         tolerated the procedure well. The quality of the bowel                         preparation was evaluated using the BBPS [facility] Bowel                          Preparation Scale) with scores of: Right Colon = 3,                         Transverse Colon = 3 and Left Colon = 3 (entire mucosa                         seen well with no residual staining, small fragments                         of stool or opaque liquid). The total BBPS score                         equals 9. The ileocecal valve, appendiceal orifice,                         and rectum were photographed. Findings:      Skin tags were found on perianal exam.      Three sessile polyps were found in the descending colon. The polyps were       4 to 9 mm in size. These polyps were removed with a cold snare.       Resection and retrieval were complete. Estimated blood loss was minimal.       To prevent bleeding after the polypectomy, two hemostatic clips were       successfully placed (MR safe). Clip manufacturer: [employer].       There was no bleeding at the end of the procedure.      Non-bleeding external hemorrhoids were found during retroflexion. The       hemorrhoids were medium-sized and Grade II (internal hemorrhoids that       prolapse but reduce spontaneously). Impression:            - Perianal skin tags found on perianal exam.                        -  Three 4 to 9 mm polyps in the descending colon,                         removed with a cold snare. Resected and retrieved.                         Clip manufacturer: [employer]. Clips (MR safe)                         were placed.                        - Non-bleeding external hemorrhoids. Recommendation:        - Discharge patient to home (with escort).                        - Resume previous diet today.                        - Continue present medications.                        - Await pathology results.                        - Repeat colonoscopy in 3 - 5 years for surveillance                         based on pathology results. Procedure Code(s):     --- Professional ---                        [phone],  Colonoscopy, flexible; with removal of                         tumor(s), polyp(s), or other lesion(s) by snare                         technique Diagnosis Code(s):     --- Professional ---                        D12.4, Benign neoplasm of descending colon                        K64.4, Residual hemorrhoidal skin tags                        K64.1, Second degree hemorrhoids                        R19.5, Other fecal abnormalities CPT copyright 2022 American Medical Association. All rights reserved. The codes documented in this report are preliminary and upon coder review may  be revised to meet current compliance requirements. Dr. Corinn Brooklyn Corinn Jess Brooklyn MD, MD 02/02/2024 9:11:35 AM This report has been signed electronically. Number of Addenda: 0 Note Initiated On: 02/02/2024 7:16 AM Scope Withdrawal Time: 0 hours 13 minutes 52 seconds  Total Procedure Duration: 0 hours 20 minutes 41 seconds  Estimated Blood Loss:  Estimated blood loss: none.      [facility]

## 2024-02-02 NOTE — Transfer of Care (Signed)
 Immediate Anesthesia Transfer of Care Note  Patient: April Chapman  Procedure(s) Performed: COLONOSCOPY (Rectum)  Patient Location: PACU  Anesthesia Type: General  Level of Consciousness: awake, alert  and patient cooperative  Airway and Oxygen Therapy: Patient Spontanous Breathing and Patient connected to supplemental oxygen  Post-op Assessment: Post-op Vital signs reviewed, Patient's Cardiovascular Status Stable, Respiratory Function Stable, Patent Airway and No signs of Nausea or vomiting  Post-op Vital Signs: Reviewed and stable  Complications: No notable events documented.

## 2024-02-02 NOTE — H&P (Signed)
 Corinn JONELLE Brooklyn, MD [facility], [facility]IP [address]  [place], [place] [address]  Main: [phone] Fax:  [phone] Pager: [phone]   Primary Care Physician:  Glendia Shad, MD Primary Gastroenterologist:  Dr. Corinn JONELLE Brooklyn  Pre-Procedure History & Physical: HPI:  April Chapman is a 73 y.o. female is here for an colonoscopy.   Past Medical History:  Diagnosis Date   Arthritis    hands   Chronic kidney disease H/O   acute kidney failure when multiple mylenoma found   Complication of anesthesia    HARD TO WAKE UP   History of chicken pox    History of kidney stones    Multiple myeloma (HCC)    s/p stem cell transplant (08/2010) and 2018   Neuromuscular disorder (HCC)    neuropathy   Pneumonia    3 years ago hospitalized   Seasonal allergies     Past Surgical History:  Procedure Laterality Date   APPENDECTOMY  03/2015   APPENDECTOMY      Prior to Admission medications   Medication Sig Start Date End Date Taking? Authorizing Provider  valACYclovir  (VALTREX ) 500 MG tablet Take 500 mg by mouth 2 (two) times daily as needed.   Yes [provider]    Allergies as of 01/26/2024 - Review Complete 12/07/2023  Allergen Reaction Noted   Revlimid [lenalidomide]  01/04/2013   Pomalidomide Rash 09/24/2019    Family History  Problem Relation Age of Onset   Mental illness Mother    Hypercholesterolemia Sister    Colon cancer Neg Hx    Breast cancer Neg Hx     Social History   Socioeconomic History   Marital status: Widowed    Spouse name: Not on file   Number of children: 2   Years of education: Not on file   Highest education level: Not on file  Occupational History   Occupation: retired    Associate Professor: [employer]  Tobacco Use   Smoking status: Never   Smokeless tobacco: Never  Vaping Use   Vaping status: Never Used  Substance and Sexual Activity   Alcohol use: Never   Drug use: Never   Sexual activity: Never   Other Topics Concern   Not on file  Social History Narrative   Not on file   Social Drivers of Health   Financial Resource Strain: Low Risk  (10/27/2023)   Overall Financial Resource Strain (CARDIA)    Difficulty of Paying Living Expenses: Not hard at all  Food Insecurity: No Food Insecurity (10/27/2023)   Hunger Vital Sign    Worried About Running Out of Food in the Last Year: Never true    Ran Out of Food in the Last Year: Never true  Transportation Needs: No Transportation Needs (10/27/2023)   PRAPARE - Administrator, Civil Service (Medical): No    Lack of Transportation (Non-Medical): No  Physical Activity: Inactive (10/27/2023)   Exercise Vital Sign    Days of Exercise per Week: 0 days    Minutes of Exercise per Session: 0 min  Stress: No Stress Concern Present (10/27/2023)   [employer] of Occupational Health - Occupational Stress Questionnaire    Feeling of Stress: Not at all  Social Connections: Moderately Isolated (10/27/2023)   Social Connection and Isolation Panel    Frequency of Communication with Friends and Family: More than three times a week    Frequency of Social Gatherings with Friends and Family: More than three  times a week    Attends Religious Services: More than 4 times per year    Active Member of Clubs or Organizations: No    Attends Banker Meetings: Never    Marital Status: Widowed  Intimate Partner Violence: Not At Risk (10/27/2023)   Humiliation, Afraid, Rape, and Kick questionnaire    Fear of Current or Ex-Partner: No    Emotionally Abused: No    Physically Abused: No    Sexually Abused: No    Review of Systems: See HPI, otherwise negative ROS  Physical Exam: BP (!) 148/69   Pulse 61   Temp 97.8 F (36.6 C) (Temporal)   Resp 18   Ht 5' 7 (1.702 m)   Wt 81.6 kg   SpO2 99%   BMI 28.19 kg/m  General:   Alert,  pleasant and cooperative in NAD Head:  Normocephalic and atraumatic. Neck:  Supple; no masses or  thyromegaly. Lungs:  Clear throughout to auscultation.    Heart:  Regular rate and rhythm. Abdomen:  Soft, nontender and nondistended. Normal bowel sounds, without guarding, and without rebound.   Neurologic:  Alert and  oriented x4;  grossly normal neurologically.  Impression/Plan: Darice SHAUNNA Lindau is here for an colonoscopy to be performed for positive cologuard  Risks, benefits, limitations, and alternatives regarding  colonoscopy have been reviewed with the patient.  Questions have been answered.  All parties agreeable.   Corinn Brooklyn, MD  02/02/2024, 8:12 AM

## 2024-02-02 NOTE — Anesthesia Postprocedure Evaluation (Signed)
 Anesthesia Post Note  Patient: April Chapman  Procedure(s) Performed: COLONOSCOPY (Rectum) POLYPECTOMY, INTESTINE (Rectum)  Patient location during evaluation: PACU Anesthesia Type: General Level of consciousness: awake and alert Pain management: pain level controlled Vital Signs Assessment: post-procedure vital signs reviewed and stable Respiratory status: spontaneous breathing, nonlabored ventilation, respiratory function stable and patient connected to nasal cannula oxygen Cardiovascular status: blood pressure returned to baseline and stable Postop Assessment: no apparent nausea or vomiting Anesthetic complications: no   No notable events documented.   Last Vitals:  Vitals:   02/02/24 0915 02/02/24 0922  BP: (!) 99/49 116/65  Pulse: (!) 50 63  Resp: 13 13  Temp:    SpO2: 96% 98%    Last Pain:  Vitals:   02/02/24 0922  TempSrc:   PainSc: 0-No pain                 Donny JAYSON Mu

## 2024-02-04 ENCOUNTER — Ambulatory Visit: Payer: Self-pay | Admitting: Gastroenterology

## 2024-02-04 LAB — SURGICAL PATHOLOGY

## 2024-02-04 NOTE — Progress Notes (Signed)
 Recommend surveillance colonoscopy in 5 years  RV

## 2024-05-31 ENCOUNTER — Encounter: Admitting: Internal Medicine

## 2024-11-01 ENCOUNTER — Ambulatory Visit
